# Patient Record
Sex: Female | Born: 1965 | Race: White | Hispanic: No | Marital: Married | Smoking: Former smoker
Health system: Southern US, Community
[De-identification: ages and names within clinical notes are randomized; demographics above are authoritative.]

## PROBLEM LIST (undated history)

## (undated) DIAGNOSIS — R600 Localized edema: Secondary | ICD-10-CM

## (undated) DIAGNOSIS — N289 Disorder of kidney and ureter, unspecified: Secondary | ICD-10-CM

## (undated) DIAGNOSIS — K219 Gastro-esophageal reflux disease without esophagitis: Secondary | ICD-10-CM

## (undated) DIAGNOSIS — F419 Anxiety disorder, unspecified: Secondary | ICD-10-CM

## (undated) DIAGNOSIS — C4491 Basal cell carcinoma of skin, unspecified: Secondary | ICD-10-CM

## (undated) DIAGNOSIS — E538 Deficiency of other specified B group vitamins: Secondary | ICD-10-CM

## (undated) DIAGNOSIS — G249 Dystonia, unspecified: Secondary | ICD-10-CM

## (undated) DIAGNOSIS — M549 Dorsalgia, unspecified: Secondary | ICD-10-CM

## (undated) DIAGNOSIS — G43909 Migraine, unspecified, not intractable, without status migrainosus: Secondary | ICD-10-CM

## (undated) DIAGNOSIS — R87629 Unspecified abnormal cytological findings in specimens from vagina: Secondary | ICD-10-CM

## (undated) DIAGNOSIS — I10 Essential (primary) hypertension: Secondary | ICD-10-CM

## (undated) DIAGNOSIS — K5900 Constipation, unspecified: Secondary | ICD-10-CM

## (undated) DIAGNOSIS — B029 Zoster without complications: Secondary | ICD-10-CM

## (undated) DIAGNOSIS — F909 Attention-deficit hyperactivity disorder, unspecified type: Secondary | ICD-10-CM

## (undated) DIAGNOSIS — G259 Extrapyramidal and movement disorder, unspecified: Secondary | ICD-10-CM

## (undated) DIAGNOSIS — G20C Parkinsonism, unspecified: Secondary | ICD-10-CM

## (undated) HISTORY — DX: Unspecified abnormal cytological findings in specimens from vagina: R87.629

## (undated) HISTORY — DX: Basal cell carcinoma of skin, unspecified: C44.91

## (undated) HISTORY — DX: Localized edema: R60.0

## (undated) HISTORY — DX: Attention-deficit hyperactivity disorder, unspecified type: F90.9

## (undated) HISTORY — DX: Migraine, unspecified, not intractable, without status migrainosus: G43.909

## (undated) HISTORY — DX: Essential (primary) hypertension: I10

## (undated) HISTORY — DX: Deficiency of other specified B group vitamins: E53.8

## (undated) HISTORY — PX: ANKLE ARTHROSCOPY: SUR85

## (undated) HISTORY — DX: Disorder of kidney and ureter, unspecified: N28.9

## (undated) HISTORY — DX: Dystonia, unspecified: G24.9

## (undated) HISTORY — DX: Gastro-esophageal reflux disease without esophagitis: K21.9

## (undated) HISTORY — DX: Zoster without complications: B02.9

## (undated) HISTORY — DX: Constipation, unspecified: K59.00

## (undated) HISTORY — DX: Dorsalgia, unspecified: M54.9

## (undated) HISTORY — DX: Parkinsonism, unspecified: G20.C

## (undated) HISTORY — DX: Anxiety disorder, unspecified: F41.9

## (undated) HISTORY — PX: CRYOTHERAPY: SHX1416

## (undated) HISTORY — DX: Extrapyramidal and movement disorder, unspecified: G25.9

---

## 2018-04-23 ENCOUNTER — Ambulatory Visit: Payer: BC Managed Care – PPO | Attending: Neurology | Admitting: Physical Therapy

## 2018-04-23 DIAGNOSIS — R293 Abnormal posture: Secondary | ICD-10-CM

## 2018-04-23 DIAGNOSIS — R2681 Unsteadiness on feet: Secondary | ICD-10-CM | POA: Diagnosis present

## 2018-04-23 DIAGNOSIS — R2689 Other abnormalities of gait and mobility: Secondary | ICD-10-CM | POA: Diagnosis present

## 2018-04-24 NOTE — Therapy (Signed)
[facility] [address] [place], [place], [address] Phone: [phone]   Fax:  [phone]  Physical Therapy Evaluation  Patient Details  Name: Gina Washington MRN: [id number] Date of Birth: 1966/09/10 Referring Provider: Sonia Baller, MD   Encounter Date: 04/23/2018  PT End of Session - 04/24/18 1553    Visit Number  1    Number of Visits  9    Date for PT Re-Evaluation  06/23/18    Authorization Type  BCBS    PT Start Time  1403    PT Stop Time  1448    PT Time Calculation (min)  45 min    Activity Tolerance  Patient tolerated treatment well    Behavior During Therapy  [facility] for tasks assessed/performed       No past medical history on file.    There were no vitals filed for this visit.   Subjective Assessment - 04/24/18 1547    Subjective  Been to 3 neurologists, ruling out PD, MS, stroke, with dx of functional movement disorder.  R foot and R UE affected, since 2012-2013, with sometimes RLE dragging and involuntary twitches.  Sometimes R foot catches the ground, but no reported falls.    Patient is accompained by:  Family member husband    Pertinent History  See med list from Dr. Hall Busing neurologist visit    Patient Stated Goals  Pt's goal for therapy is to improve symptoms.    Currently in Pain?  No/denies         [facility] PT Assessment - 04/23/18 1414      Assessment   Medical Diagnosis  Parkinsonian features    Referring Provider  Sonia Baller, MD    Onset Date/Surgical Date  04/13/18 MD visit    Hand Dominance  Right handwriting is worse      Precautions   Precautions  Fall      Balance Screen   Has the patient fallen in the past 6 months  No    Has the patient had a decrease in activity level because of a fear of falling?   Yes    Is the patient reluctant to leave their home because of a fear of falling?   No      Home Social worker  Private residence    Living  Arrangements  Spouse/significant other    Available Help at Discharge  Family    Type of [place] to enter    Entrance Stairs-Number of Steps  8    [place]  Two level;Able to live on main level with bedroom/bathroom    Home Equipment  None      Prior Function   Level of Independence  Independent with basic ADLs prefers to use LUE for ADLs    Vocation  Full time employment    Vocation Requirements  Special Ed teacher    Leisure  Enjoyed running, but has not done in a while, did aerobics, but hasn't done since move to [place]      Observation/Other Assessments   Focus on Therapeutic Outcomes (FOTO)   NA      ROM / Strength   AROM / PROM / Strength  Strength      Strength   Overall Strength Comments  Grossly tested 4/5 RLE, 5/5 LLE      Transfers   Transfers  Sit to Stand;Stand to Sit    Sit to Stand  6: Modified independent (Device/Increase time);Without upper extremity assist;From chair/3-in-1    Five time sit to stand comments   12.5    Stand to Sit  6: Modified independent (Device/Increase time);Without upper extremity assist;To chair/3-in-1    Comments  Does not report any difficulty getting up and down from chairs or low surfaces at home      Ambulation/Gait   Ambulation/Gait  Yes    Ambulation/Gait Assistance  6: Modified independent (Device/Increase time)    Ambulation Distance (Feet)  250 Feet    Assistive device  None    Gait Pattern  Step-through pattern;Decreased arm swing - right;Decreased step length - right;Decreased dorsiflexion - right;Poor foot clearance - right    Ambulation Surface  Level;Indoor    Gait velocity  11.02 (2.98 ft/sec) 12.6 cued heelstrike (2.6 ft/sec)      Standardized Balance Assessment   Standardized Balance Assessment  Timed Up and Go Test      Timed Up and Go Test   Normal TUG (seconds)  12.35    Manual TUG (seconds)  14.75    Cognitive TUG (seconds)  13.87    TUG  Comments  Scores >13.5-15 seconds indicates increased fall risk.  Scores >10% difference in TUG and TUG cog/man indicate difficulty with dual tasking with gait      Functional Gait  Assessment   Gait assessed   Yes    Gait Level Surface  Walks 20 ft, slow speed, abnormal gait pattern, evidence for imbalance or deviates 10-15 in outside of the 12 in walkway width. Requires more than 7 sec to ambulate 20 ft.    Change in Gait Speed  Able to change speed, demonstrates mild gait deviations, deviates 6-10 in outside of the 12 in walkway width, or no gait deviations, unable to achieve a major change in velocity, or uses a change in velocity, or uses an assistive device.    Gait with Horizontal Head Turns  Performs head turns smoothly with slight change in gait velocity (eg, minor disruption to smooth gait path), deviates 6-10 in outside 12 in walkway width, or uses an assistive device.    Gait with Vertical Head Turns  Performs task with slight change in gait velocity (eg, minor disruption to smooth gait path), deviates 6 - 10 in outside 12 in walkway width or uses assistive device    Gait and Pivot Turn  Pivot turns safely within 3 sec and stops quickly with no loss of balance.    Step Over Obstacle  Is able to step over one shoe box (4.5 in total height) without changing gait speed. No evidence of imbalance.    Gait with Narrow Base of Support  Is able to ambulate for 10 steps heel to toe with no staggering.    Gait with Eyes Closed  Walks 20 ft, slow speed, abnormal gait pattern, evidence for imbalance, deviates 10-15 in outside 12 in walkway width. Requires more than 9 sec to ambulate 20 ft. 11.37    Ambulating Backwards  Walks 20 ft, uses assistive device, slower speed, mild gait deviations, deviates 6-10 in outside 12 in walkway width. 14.96    Steps  Alternating feet, no rail.    Total Score  21    FGA comment:  Scores <22/30 indicate increased fall risk.                Objective  measurements completed on examination: See above  findings.                   PT Long Term Goals - 04/24/18 1600      PT LONG TERM GOAL #1   Title  Pt will be independent with HEP for improved balance and gait.  TARGET 05/22/18    Time  4    Period  Weeks    Status  New    Target Date  05/22/18      PT LONG TERM GOAL #2   Title  Pt will improve TUG and TUG cog/man scores to <10% difference for improved dual tasking with gait.    Time  4    Period  Weeks    Status  New    Target Date  05/22/18      PT LONG TERM GOAL #3   Title  Pt will improve Functional Gait Assessment score to at least 23/30 for decreased fall risk.    Time  4    Period  Weeks    Status  New    Target Date  05/22/18      PT LONG TERM GOAL #4   Title  Pt will verbalize understanding of fall prevention in home environment.    Time  4    Period  Weeks    Status  New    Target Date  05/22/18      PT LONG TERM GOAL #5   Title  Pt will verbalize plans for continued community fitness upon d/c from PT.    Time  4    Period  Weeks    Status  New    Target Date  05/22/18             Plan - 04/24/18 1554    Clinical Impression Statement  Pt is a 52 year old female who was referred by Dr. Hall Busing, with Parkinsonian features, but no clear PD diagnosis, with recent diagnosis of functional movement disorder.  She reports several year history of R foot drag with gait, decreased coordination/use of RUE with handwriting and ADLs.  Pt presents with decreased timing and coordination of gait, decreased balance.  Pt works as a Agricultural engineer and has enjoyed yoga in the past.  She would benefit from skilled PT to address the above stated deficits for decreased fall risk and improved safe and independent functional mobility.    History and Personal Factors relevant to plan of care:  PMH includes HTN    Clinical Presentation  Stable    Clinical Presentation due to:  fall risk per TUG /TUG cog/TUG man scores,  FGA scores    Clinical Decision Making  Low    Rehab Potential  Good    PT Frequency  2x / week    PT Duration  4 weeks plus eval    PT Treatment/Interventions  ADLs/Self Care Home Management;Gait training;Functional mobility training;Therapeutic activities;Therapeutic exercise;Balance training;Patient/family education;Neuromuscular re-education    PT Next Visit Plan  Initiate HEP for PWR! Moves in sitting, standing, gait training to focus on heelstrike and arm swing    Recommended Other Services  Possible referral to OT    Consulted and Agree with Plan of Care  Family member/caregiver;Patient    Family Member Consulted  husband       Patient will benefit from skilled therapeutic intervention in order to improve the following deficits and impairments:  Abnormal gait, Decreased balance, Decreased mobility, Difficulty walking  Visit Diagnosis: Other abnormalities of gait  and mobility  Unsteadiness on feet  Abnormal posture     Problem List There are no active problems to display for this patient.   Frazier Butt. 04/24/2018, 4:05 PM  Frazier Butt., PT   [facility] [address] [place], [place], [address] Phone: [phone]   Fax:  [phone]  Name: Gina Washington MRN: [id number] Date of Birth: 11-14-1965

## 2018-04-30 ENCOUNTER — Ambulatory Visit: Payer: BC Managed Care – PPO | Admitting: Physical Therapy

## 2018-04-30 ENCOUNTER — Encounter: Payer: Self-pay | Admitting: Physical Therapy

## 2018-04-30 ENCOUNTER — Ambulatory Visit: Payer: Self-pay | Admitting: Rehabilitation

## 2018-04-30 DIAGNOSIS — R293 Abnormal posture: Secondary | ICD-10-CM

## 2018-04-30 DIAGNOSIS — R2689 Other abnormalities of gait and mobility: Secondary | ICD-10-CM

## 2018-04-30 NOTE — Therapy (Signed)
[facility] [address] [place], [place], [address] Phone: [phone]   Fax:  [phone]  Physical Therapy Treatment  Patient Details  Name: Gina Washington MRN: [id number] Date of Birth: 07-28-1966 Referring Provider: Sonia Baller, MD   Encounter Date: 04/30/2018  PT End of Session - 04/30/18 2054    Visit Number  2    Number of Visits  9    Date for PT Re-Evaluation  06/23/18    Authorization Type  BCBS    PT Start Time  1316    PT Stop Time  1400    PT Time Calculation (min)  44 min    Activity Tolerance  Patient tolerated treatment well    Behavior During Therapy  [facility] for tasks assessed/performed       No past medical history on file.    There were no vitals filed for this visit.  Subjective Assessment - 04/30/18 1321    Subjective  No changes, just ready to make progress.    Patient is accompained by:  Family member husband    Pertinent History  See med list from Dr. Hall Busing neurologist visit    Patient Stated Goals  Pt's goal for therapy is to improve symptoms.    Currently in Pain?  No/denies                       [facility] Adult PT Treatment/Exercise - 04/30/18 0001      Ambulation/Gait   Ambulation/Gait  Yes    Ambulation/Gait Assistance  5: Supervision    Ambulation/Gait Assistance Details  Gait training with cues for large amplitude movement for increased RLE step length and relaxed arm swing.    Ambulation Distance (Feet)  80 Feet x 4, then 150 ft, then 80 ft x 2    Assistive device  None bilateral walking poles to facilitate arm swing    Gait Pattern  Step-through pattern;Decreased arm swing - right;Decreased step length - right;Decreased dorsiflexion - right;Poor foot clearance - right    Ambulation Surface  Level;Indoor    Gait Comments  Gait training with walking poles x 150 ft, then walking poles removed with cues to continue relaxed arm swing and increased RLE step length.         PWR [facility]) - 04/30/18 1328    PWR! exercises  Moves in sitting;Moves in standing    PWR! Up  x 10 reps    PWR! Rock  x 10 reps each side    PWR! Twist  x 10 reps each side    PWR Step  x 10 reps each side; then forward step and reach x 10, back step and reach x 10 reps    Comments  The above performed after several initial reps of each for proper technique, cues and rationale provided for large movement patterns for posture, weightshifting, trunk flexibility, and initiation of stepping.    PWR! Up  x 10    PWR! Rock  x 10 reps each side    PWR! Twist  x 10 reps each side    PWR! Step  x 5 reps each side    Comments  The above performed after initial 2-3 reps of each exercise to gain proper technique.  PT provides visual and verbal cues; provides rationale for these large amplitude movement patterns.          PT Education - 04/30/18 2054    Education Details  HEP for  large amplitude (PWR!) Moves in sitting and standing; discussed rationale for large amplitude movement patterns; discussed OT given RUE difficulty with coordination, handwriting, ADLs    Person(s) Educated  Patient    Methods  Explanation;Demonstration;Handout    Comprehension  Verbalized understanding;Returned demonstration;Verbal cues required;Need further instruction          PT Long Term Goals - 04/24/18 1600      PT LONG TERM GOAL #1   Title  Pt will be independent with HEP for improved balance and gait.  TARGET 05/22/18    Time  4    Period  Weeks    Status  New    Target Date  05/22/18      PT LONG TERM GOAL #2   Title  Pt will improve TUG and TUG cog/man scores to <10% difference for improved dual tasking with gait.    Time  4    Period  Weeks    Status  New    Target Date  05/22/18      PT LONG TERM GOAL #3   Title  Pt will improve Functional Gait Assessment score to at least 23/30 for decreased fall risk.    Time  4    Period  Weeks    Status  New    Target Date  05/22/18      PT LONG  TERM GOAL #4   Title  Pt will verbalize understanding of fall prevention in home environment.    Time  4    Period  Weeks    Status  New    Target Date  05/22/18      PT LONG TERM GOAL #5   Title  Pt will verbalize plans for continued community fitness upon d/c from PT.    Time  4    Period  Weeks    Status  New    Target Date  05/22/18            Plan - 04/30/18 2056    Clinical Impression Statement  Skilled PT session focused today on sitting and standing activities utilizing large amplitude movement pattern exercises (PWR! Moves) for improved RUE and RLE movement.  Also worked on large amplitude movement patterns with gait for improved RLE step length and foot clearance.  Pt responds well to these exercises, but does need occasional cueing to maintain large amplitude patterns.  Discussed benefits of occupational therapy, given pt's decreased functional use of RUE with ADLs, coordination and handwriting.      Rehab Potential  Good    PT Frequency  2x / week    PT Duration  4 weeks plus eval    PT Treatment/Interventions  ADLs/Self Care Home Management;Gait training;Functional mobility training;Therapeutic activities;Therapeutic exercise;Balance training;Patient/family education;Neuromuscular re-education    PT Next Visit Plan  Review HEP for PWR! Moves in sitting, standing, continue gait training to focus on heelstrike and arm swing    Recommended Other Services  Pt agrees to pursue OT referral    Consulted and Agree with Plan of Care  Patient    Family Member Consulted          Patient will benefit from skilled therapeutic intervention in order to improve the following deficits and impairments:  Abnormal gait, Decreased balance, Decreased mobility, Difficulty walking  Visit Diagnosis: Abnormal posture  Other abnormalities of gait and mobility     Problem List There are no active problems to display for this patient.   Gina Washington W. 04/30/2018, 9:00 PM  [facility] [address] [place], [place], [address] Phone: [phone]   Fax:  [phone]  Name: Gina Washington MRN: [id number] Date of Birth: [birth year]

## 2018-05-01 ENCOUNTER — Encounter

## 2018-05-06 ENCOUNTER — Ambulatory Visit: Payer: BC Managed Care – PPO | Admitting: Physical Therapy

## 2018-05-06 ENCOUNTER — Telehealth: Payer: Self-pay | Admitting: Physical Therapy

## 2018-05-06 ENCOUNTER — Encounter: Payer: Self-pay | Admitting: Physical Therapy

## 2018-05-06 DIAGNOSIS — R2681 Unsteadiness on feet: Secondary | ICD-10-CM

## 2018-05-06 DIAGNOSIS — R2689 Other abnormalities of gait and mobility: Secondary | ICD-10-CM

## 2018-05-06 NOTE — Patient Instructions (Signed)
Provided patient with PWR! Moves Handout:  -Forward step and weightshift x 10 reps each leg beside counter  -Back step and weightshfit x 10 reps each leg, facing counter

## 2018-05-06 NOTE — Telephone Encounter (Signed)
Paper faxed request for OT order to evaluate and treat for Gina Washington, due to difficulty with ADLs and decreased RUE use.    Mady Haagensen, PT 05/06/18 11:40 AM Phone: [phone] Fax: [phone]

## 2018-05-06 NOTE — Therapy (Signed)
[facility] [address] [place], [place], [address] Phone: [phone]   Fax:  [phone]  Physical Therapy Treatment  Patient Details  Name: Gina Washington MRN: [id number] Date of Birth: November 19, 1965 Referring Provider: Sonia Baller, MD   Encounter Date: 05/06/2018  PT End of Session - 05/06/18 1128    Visit Number  3    Number of Visits  9    Date for PT Re-Evaluation  06/23/18    Authorization Type  BCBS    PT Start Time  0849    PT Stop Time  0929    PT Time Calculation (min)  40 min    Equipment Utilized During Treatment  Gait belt    Activity Tolerance  Patient tolerated treatment well    Behavior During Therapy  [facility] for tasks assessed/performed       History reviewed. No pertinent past medical history.  History reviewed. No pertinent surgical history.  There were no vitals filed for this visit.  Subjective Assessment - 05/06/18 0851    Subjective  No changes, no falls, been doing the exercises some.    Patient is accompained by:  Family member husband    Pertinent History  See med list from Dr. Hall Busing neurologist visit    Patient Stated Goals  Pt's goal for therapy is to improve symptoms.    Currently in Pain?  No/denies                       [facility] Adult PT Treatment/Exercise - 05/06/18 0001      Ambulation/Gait   Ambulation/Gait  Yes    Ambulation/Gait Assistance  5: Supervision    Ambulation/Gait Assistance Details  Cues for increased stance time on RLE for more even stance time R vs. L.  Cues for heelstrike, increased step length RLE for equal, even step length both sides.    Ambulation Distance (Feet)  300 Feet    Assistive device  None    Gait Pattern  Step-through pattern;Decreased arm swing - right;Decreased step length - right;Decreased dorsiflexion - right;Poor foot clearance - right;Decreased stance time - right    Ambulation Surface  Level;Indoor    Gait Comments   Treadmill gait training 5 minutes, bilateral UE supported, at 0.8 mph (with cues for large steps to "kick" blue therapy ball in front of treadmill) up to 1.6 mph with frequent cues for foot clearance, heelstrike, increased RLE stance time.  Followed treadmill gait with on-ground gait as noted above.      Neuro Re-ed    Neuro Re-ed Details   Forward step and weightshift x 10 reps each leg, then back step and weightshift 10 reps each leg at counter.  Cues for increased effort, amplitude of RLE with heelstrike and foot clearance.        [facility]) - 05/06/18 0272    PWR! exercises  Moves in sitting;Moves in standing    PWR! Up  x 20 reps    PWR! Rock  x 10 reps each side    PWR! Twist  x 10 reps each side    PWR Step  x 10 reps each side    Comments  REviewed HEP with pt return demo understanding    PWR! Up  x 20     PWR! Rock   10 reps each side    PWR! Twist  x 10 reps each side    PWR! Step  x 10 reps each  side    Comments  REview of HEP given last visit-pt return demo understanding          PT Education - 05/06/18 1125    Education Details  Reviewed HEP with pt return demo understanding; gait mechanics-improved RLE step length, stance time, arm swing; added forward and backward step and weightshift to HEP    Person(s) Educated  Patient    Methods  Explanation;Demonstration;Verbal cues;Handout    Comprehension  Verbalized understanding;Returned demonstration;Verbal cues required;Need further instruction          PT Long Term Goals - 04/24/18 1600      PT LONG TERM GOAL #1   Title  Pt will be independent with HEP for improved balance and gait.  TARGET 05/22/18    Time  4    Period  Weeks    Status  New    Target Date  05/22/18      PT LONG TERM GOAL #2   Title  Pt will improve TUG and TUG cog/man scores to <10% difference for improved dual tasking with gait.    Time  4    Period  Weeks    Status  New    Target Date  05/22/18      PT LONG TERM GOAL #3   Title  Pt  will improve Functional Gait Assessment score to at least 23/30 for decreased fall risk.    Time  4    Period  Weeks    Status  New    Target Date  05/22/18      PT LONG TERM GOAL #4   Title  Pt will verbalize understanding of fall prevention in home environment.    Time  4    Period  Weeks    Status  New    Target Date  05/22/18      PT LONG TERM GOAL #5   Title  Pt will verbalize plans for continued community fitness upon d/c from PT.    Time  4    Period  Weeks    Status  New    Target Date  05/22/18            Plan - 05/06/18 1129    Clinical Impression Statement  Pt reports noticing more awareness of heelstrike with gait with performance of exercises.  Skilled PT session today focused on review of HEP and additions of forward and backward step and weightshift exercises to help with further practice/reinforcement of heelstrike; also worked on gait training on treadmill and on ground with pt noted to have decreased RLE stance time (able to improve with cues and practice).    Rehab Potential  Good    PT Frequency  2x / week    PT Duration  4 weeks plus eval    PT Treatment/Interventions  ADLs/Self Care Home Management;Gait training;Functional mobility training;Therapeutic activities;Therapeutic exercise;Balance training;Patient/family education;Neuromuscular re-education    PT Next Visit Plan  Review HEP for PWR! Moves in, standing, continue gait training to focus on heelstrike and arm swing, work on compliant surfaces, SLS RLE PT is fsxing OT request to Dr. Hall Busing 05/06/18    Consulted and Agree with Plan of Care  Patient    Family Member Consulted          Patient will benefit from skilled therapeutic intervention in order to improve the following deficits and impairments:  Abnormal gait, Decreased balance, Decreased mobility, Difficulty walking  Visit Diagnosis: Unsteadiness on feet  Other abnormalities of  gait and mobility     Problem List There are no active  problems to display for this patient.   Hiep Ollis W. 05/06/2018, 11:41 AM  Frazier Butt., PT   [facility] [address] [place], [place], [address] Phone: [phone]   Fax:  [phone]  Name: Gina Washington MRN: [id number] Date of Birth: 1966-08-23

## 2018-05-08 ENCOUNTER — Ambulatory Visit: Payer: BC Managed Care – PPO | Admitting: Physical Therapy

## 2018-05-08 ENCOUNTER — Encounter: Payer: Self-pay | Admitting: Physical Therapy

## 2018-05-08 DIAGNOSIS — R2689 Other abnormalities of gait and mobility: Secondary | ICD-10-CM | POA: Diagnosis not present

## 2018-05-08 DIAGNOSIS — R2681 Unsteadiness on feet: Secondary | ICD-10-CM

## 2018-05-08 NOTE — Therapy (Signed)
[facility] [address] [place], [place], [address] Phone: [phone]   Fax:  [phone]  Physical Therapy Treatment  Patient Details  Name: Gina Washington MRN: [id number] Date of Birth: Oct 10, 1966 Referring Provider: Sonia Baller, MD   Encounter Date: 05/08/2018  PT End of Session - 05/08/18 1130    Visit Number  4    Number of Visits  9    Date for PT Re-Evaluation  06/23/18    Authorization Type  BCBS    PT Start Time  [phone] arrived late, then went to restroom upon arrival    PT Stop Time  0930    PT Time Calculation (min)  34 min    Activity Tolerance  Patient tolerated treatment well    Behavior During Therapy  [facility] for tasks assessed/performed       History reviewed. No pertinent past medical history.  History reviewed. No pertinent surgical history.  There were no vitals filed for this visit.  Subjective Assessment - 05/08/18 0856    Subjective  Sorry I'm late-navigated to the wrong place (counselor office instead of PT)    Patient is accompained by:  Family member husband    Pertinent History  See med list from Dr. Hall Busing neurologist visit    Patient Stated Goals  Pt's goal for therapy is to improve symptoms.    Currently in Pain?  No/denies                       [facility] Adult PT Treatment/Exercise - 05/08/18 0001      Ambulation/Gait   Ambulation/Gait  Yes    Ambulation/Gait Assistance  5: Supervision    Ambulation/Gait Assistance Details  Cues for increased heelstrike on RLE, increased foot clearance/step length and RLE stance time.  First bout of gait:  2 episodes of R toe catching, 2nd bout of gait, no episodes of R foot catching.    Ambulation Distance (Feet)  230 Feet 400    Assistive device  None    Gait Pattern  Step-through pattern;Decreased arm swing - right;Decreased step length - right;Decreased dorsiflexion - right;Poor foot clearance - right;Decreased stance time -  right    Ambulation Surface  Level;Indoor      Neuro Re-ed    Neuro Re-ed Details   Forward step and weightshift x 10 reps each leg, then back step and weightshift 10 reps each leg at counter.  Cues for increased effort, amplitude of RLE with heelstrike and foot clearance.  (Review of HEP given last visit-pt return demo understanding).  Standing beside counter, combined forward>backward step and weightshift 10-15 reps each side with focus on foot clearance and smooth movement "rolling" R foot through heelstrike>push-off.  Stagger stance forward/back rocking x 10-15 reps each side with added arm swing      Exercises   Exercises  Ankle      Ankle Exercises: Standing   Other Standing Ankle Exercises  for RLE weigthbearing:  wall squats x 15 reps, sit<>stand with L foot on baalnce disk 2 sets x 10 reps      Ankle Exercises: Seated   Toe Raise  10 reps 2 sets    Other Seated Ankle Exercises  Ankle eversion 2 sets x 10 reps                  PT Long Term Goals - 04/24/18 1600      PT LONG TERM GOAL #1   Title  Pt will be independent with HEP for improved balance and gait.  TARGET 05/22/18    Time  4    Period  Weeks    Status  New    Target Date  05/22/18      PT LONG TERM GOAL #2   Title  Pt will improve TUG and TUG cog/man scores to <10% difference for improved dual tasking with gait.    Time  4    Period  Weeks    Status  New    Target Date  05/22/18      PT LONG TERM GOAL #3   Title  Pt will improve Functional Gait Assessment score to at least 23/30 for decreased fall risk.    Time  4    Period  Weeks    Status  New    Target Date  05/22/18      PT LONG TERM GOAL #4   Title  Pt will verbalize understanding of fall prevention in home environment.    Time  4    Period  Weeks    Status  New    Target Date  05/22/18      PT LONG TERM GOAL #5   Title  Pt will verbalize plans for continued community fitness upon d/c from PT.    Time  4    Period  Weeks    Status   New    Target Date  05/22/18            Plan - 05/08/18 1131    Clinical Impression Statement  With focus and attention to R foot clearance, heelstrike, smooth "roll" through push off into swing phase with exercises at counter, pt has good RLE and R foot movements; however, with gait initially and with distraction, she has decreased R foot clearance.  With weightbearing and seated ankle exercises, pt noted to have increased RLE tremor.  Pt will continue to benefit from skilled PT to address gait and balance.    Rehab Potential  Good    PT Frequency  2x / week    PT Duration  4 weeks plus eval    PT Treatment/Interventions  ADLs/Self Care Home Management;Gait training;Functional mobility training;Therapeutic activities;Therapeutic exercise;Balance training;Patient/family education;Neuromuscular re-education    PT Next Visit Plan  Continue gait training to focus on heelstrike, arm swing; try compliant surfaces, SLS activities for RLE    Recommended Other Services  PT faxed OT referral request to Dr. Hall Busing 05/06/18    Consulted and Agree with Plan of Care  Patient    Family Member Consulted          Patient will benefit from skilled therapeutic intervention in order to improve the following deficits and impairments:  Abnormal gait, Decreased balance, Decreased mobility, Difficulty walking  Visit Diagnosis: Other abnormalities of gait and mobility  Unsteadiness on feet     Problem List There are no active problems to display for this patient.   MARRIOTT,AMY W. 05/08/2018, 11:34 AM  Frazier Butt., PT   [facility] [address] [place], [place], [address] Phone: [phone]   Fax:  [phone]  Name: Gina Washington MRN: [id number] Date of Birth: November 07, 1965

## 2018-05-11 ENCOUNTER — Encounter: Payer: Self-pay | Admitting: Physical Therapy

## 2018-05-11 ENCOUNTER — Ambulatory Visit: Payer: BC Managed Care – PPO | Admitting: Physical Therapy

## 2018-05-11 DIAGNOSIS — R2689 Other abnormalities of gait and mobility: Secondary | ICD-10-CM | POA: Diagnosis not present

## 2018-05-11 DIAGNOSIS — R293 Abnormal posture: Secondary | ICD-10-CM

## 2018-05-11 NOTE — Therapy (Signed)
[facility] [address] [place], [place], [address] Phone: [phone]   Fax:  [phone]  Physical Therapy Treatment  Patient Details  Name: Gina Washington MRN: [id number] Date of Birth: August 25, 1966 Referring Provider: Sonia Baller, MD   Encounter Date: 05/11/2018  PT End of Session - 05/11/18 1145    Visit Number  5    Number of Visits  9    Date for PT Re-Evaluation  06/23/18    Authorization Type  BCBS    PT Start Time  1105    PT Stop Time  1145    PT Time Calculation (min)  40 min    Activity Tolerance  Patient tolerated treatment well    Behavior During Therapy  [facility] for tasks assessed/performed       History reviewed. No pertinent past medical history.  History reviewed. No pertinent surgical history.  There were no vitals filed for this visit.  Subjective Assessment - 05/11/18 1109    Subjective  Noticed yesterday and today catching herself walking "normally" without having to think about it.    Patient is accompained by:  Family member husband    Pertinent History  See med list from Dr. Hall Busing neurologist visit    Patient Stated Goals  Pt's goal for therapy is to improve symptoms.    Currently in Pain?  No/denies                       [facility] Adult PT Treatment/Exercise - 05/11/18 0001      Ambulation/Gait   Ambulation/Gait  Yes    Ambulation/Gait Assistance  5: Supervision    Ambulation/Gait Assistance Details  practising heelstrike, toe off and arm swing.    Ambulation Distance (Feet)  400 Feet indoors + 200 feet outdoors    Assistive device  None    Gait Pattern  Step-through pattern;Decreased arm swing - right;Decreased step length - right;Decreased dorsiflexion - right;Poor foot clearance - right;Decreased stance time - right    Ambulation Surface  Outdoor;Unlevel;Level;Indoor;Paved;Gravel;Grass          Balance Exercises - 05/11/18 1118      Balance Exercises:  Standing   Stepping Strategy  Anterior;Posterior;Lateral;Foam/compliant surface working on arm swing and push off    Retro Gait  Foam/compliant surface;4 reps    Sidestepping  Foam/compliant support;4 reps    Marching Limitations  high marching with 3 sec holds on gravel.             PT Long Term Goals - 04/24/18 1600      PT LONG TERM GOAL #1   Title  Pt will be independent with HEP for improved balance and gait.  TARGET 05/22/18    Time  4    Period  Weeks    Status  New    Target Date  05/22/18      PT LONG TERM GOAL #2   Title  Pt will improve TUG and TUG cog/man scores to <10% difference for improved dual tasking with gait.    Time  4    Period  Weeks    Status  New    Target Date  05/22/18      PT LONG TERM GOAL #3   Title  Pt will improve Functional Gait Assessment score to at least 23/30 for decreased fall risk.    Time  4    Period  Weeks    Status  New  Target Date  05/22/18      PT LONG TERM GOAL #4   Title  Pt will verbalize understanding of fall prevention in home environment.    Time  4    Period  Weeks    Status  New    Target Date  05/22/18      PT LONG TERM GOAL #5   Title  Pt will verbalize plans for continued community fitness upon d/c from PT.    Time  4    Period  Weeks    Status  New    Target Date  05/22/18            Plan - 05/11/18 1249    Clinical Impression Statement  Continued to work on gait mechanics (R foot clearance, heelstrike, push off and R swing ) with stepping/weigh shifting activites (compliant and non compliant surface), gait on grass and gravel having pt emphasize each technique; pt able to demonstrate with min cues during session .                                                 Rehab Potential  Good    PT Frequency  2x / week    PT Duration  4 weeks plus eval    PT Treatment/Interventions  ADLs/Self Care Home Management;Gait training;Functional mobility training;Therapeutic activities;Therapeutic exercise;Balance  training;Patient/family education;Neuromuscular re-education    PT Next Visit Plan  Continue gait training to focus on heelstrike, arm swing; try compliant surfaces, SLS activities for RLE    Consulted and Agree with Plan of Care  Patient    Family Member Consulted          Patient will benefit from skilled therapeutic intervention in order to improve the following deficits and impairments:  Abnormal gait, Decreased balance, Decreased mobility, Difficulty walking  Visit Diagnosis: Other abnormalities of gait and mobility  Abnormal posture     Problem List There are no active problems to display for this patient.   Bjorn Loser, PTA  05/11/18, 12:57 PM [facility] [address] [place], [place], [address] Phone: [phone]   Fax:  [phone]  Name: Antha Niday MRN: [id number] Date of Birth: [birth year]

## 2018-05-13 ENCOUNTER — Ambulatory Visit: Payer: BC Managed Care – PPO | Admitting: Physical Therapy

## 2018-05-19 ENCOUNTER — Encounter: Payer: Self-pay | Admitting: Physical Therapy

## 2018-05-19 ENCOUNTER — Ambulatory Visit: Payer: BC Managed Care – PPO | Attending: Neurology | Admitting: Physical Therapy

## 2018-05-19 DIAGNOSIS — R2689 Other abnormalities of gait and mobility: Secondary | ICD-10-CM | POA: Diagnosis present

## 2018-05-19 DIAGNOSIS — R2681 Unsteadiness on feet: Secondary | ICD-10-CM | POA: Diagnosis not present

## 2018-05-19 DIAGNOSIS — M6281 Muscle weakness (generalized): Secondary | ICD-10-CM | POA: Insufficient documentation

## 2018-05-19 DIAGNOSIS — R278 Other lack of coordination: Secondary | ICD-10-CM | POA: Insufficient documentation

## 2018-05-19 DIAGNOSIS — R293 Abnormal posture: Secondary | ICD-10-CM | POA: Diagnosis present

## 2018-05-19 NOTE — Patient Instructions (Signed)
ANKLE: Eversion, Bilateral    Sit at edge of surface, feet on floor. Raise toes of right foot up and and pinkie toe out. Do not move hips or knees. __100_ reps per day-- do them throughout the day.

## 2018-05-20 NOTE — Therapy (Signed)
[facility] [address] [place], [place], [address] Phone: [phone]   Fax:  [phone]  Physical Therapy Treatment  Patient Details  Name: Gina Washington MRN: [id number] Date of Birth: [birth year] Referring Provider: Sonia Baller, MD   Encounter Date: 05/19/2018  PT End of Session - 05/20/18 0744    Visit Number  6    Number of Visits  9    Date for PT Re-Evaluation  06/23/18    Authorization Type  BCBS    PT Start Time  1110 pt late arrival    PT Stop Time  1145    PT Time Calculation (min)  35 min    Activity Tolerance  Patient tolerated treatment well    Behavior During Therapy  [facility] for tasks assessed/performed       History reviewed. No pertinent past medical history.  History reviewed. No pertinent surgical history.  There were no vitals filed for this visit.  Subjective Assessment - 05/19/18 1112    Subjective  No falls no changes. Doing HEP Standing at counter ex's seem easy. [facility]    Patient is accompained by:  Family member husband    Pertinent History  See med list from Dr. Hall Busing neurologist visit    Patient Stated Goals  Pt's goal for therapy is to improve symptoms.    Currently in Pain?  No/denies                       [facility] Adult PT Treatment/Exercise - 05/19/18 1700      Ambulation/Gait   Ambulation/Gait Assistance  7: Independent    Ambulation/Gait Assistance Details  pt able to recall all strategies to improve her foot clearance, stride length, and arm swing (Of note, pt arrived wearing wedged 3" heels and reports no difficulty walking in heels; changed into sneakers for therapy)    Ambulation Distance (Feet)  200 Feet 40    Assistive device  None    Gait Pattern  Step-through pattern;Decreased arm swing - right;Poor foot clearance - right;Decreased dorsiflexion - right    Ambulation Surface   Level;Indoor;Unlevel compliant surfaces      Ankle Exercises: Seated   Other Seated Ankle Exercises  rt DF x 20; eversion x 10          Balance Exercises - 05/19/18 1700      Balance Exercises: Standing   SLS with Vectors  Foam/compliant surface cone double taps; slow march with cone taps red+blue [id number]    Retro Gait  Foam/compliant surface;4 reps red+blue mats    Sidestepping  Foam/compliant support;4 reps    Marching Limitations  high marching with 3 sec holds on red+blue mats    Other Standing Exercises  on large "river rock" ball of foot on flat surface, heel raises x 20 (from below horizontal) light UE support         PT Education - 05/20/18 0753    Education Details  addition to HEP; OK to stop fwd and backward stepping (demonstrated excellent technique, has shown carryover to gait, no instances of LOB backwards per pt report)    Person(s) Educated  Patient    Methods  Explanation;Demonstration;Verbal cues;Handout    Comprehension  Verbalized understanding;Returned demonstration;Verbal cues required          PT Long Term Goals - 04/24/18 1600      PT LONG TERM GOAL #  1   Title  Pt will be independent with HEP for improved balance and gait.  TARGET 05/22/18    Time  4    Period  Weeks    Status  New    Target Date  05/22/18      PT LONG TERM GOAL #2   Title  Pt will improve TUG and TUG cog/man scores to <10% difference for improved dual tasking with gait.    Time  4    Period  Weeks    Status  New    Target Date  05/22/18      PT LONG TERM GOAL #3   Title  Pt will improve Functional Gait Assessment score to at least 23/30 for decreased fall risk.    Time  4    Period  Weeks    Status  New    Target Date  05/22/18      PT LONG TERM GOAL #4   Title  Pt will verbalize understanding of fall prevention in home environment.    Time  4    Period  Weeks    Status  New    Target Date  05/22/18      PT LONG TERM GOAL #5   Title  Pt will verbalize plans for  continued community fitness upon d/c from PT.    Time  4    Period  Weeks    Status  New    Target Date  05/22/18            Plan - 05/20/18 0755    Clinical Impression Statement  Focus on updating HEP due to pt reporting fwd/backward stepping was very easy (and demonstrated excellent technique and carryover to gait). Strengthening, coordination, and balance with use of compliant surfaces. Discussed plan for d/c at next visit and pt reports she plans to join a water aerobics class for continued exercise.     Rehab Potential  Good    PT Frequency  2x / week    PT Duration  4 weeks plus eval    PT Treatment/Interventions  ADLs/Self Care Home Management;Gait training;Functional mobility training;Therapeutic activities;Therapeutic exercise;Balance training;Patient/family education;Neuromuscular re-education    PT Next Visit Plan  check LTGs and d/c    Consulted and Agree with Plan of Care  Patient    Family Member Consulted          Patient will benefit from skilled therapeutic intervention in order to improve the following deficits and impairments:  Abnormal gait, Decreased balance, Decreased mobility, Difficulty walking  Visit Diagnosis: Unsteadiness on feet  Other abnormalities of gait and mobility     Problem List There are no active problems to display for this patient.   Rexanne Mano, PT 05/20/2018, 7:58 AM  [facility] [address] [place], [place], [address] Phone: [phone]   Fax:  [phone]  Name: Gina Washington MRN: [id number] Date of Birth: 1966-04-03

## 2018-05-21 ENCOUNTER — Ambulatory Visit: Payer: BC Managed Care – PPO | Admitting: Physical Therapy

## 2018-05-21 ENCOUNTER — Encounter: Payer: Self-pay | Admitting: Physical Therapy

## 2018-05-21 ENCOUNTER — Ambulatory Visit: Payer: BC Managed Care – PPO | Admitting: Occupational Therapy

## 2018-05-21 DIAGNOSIS — R2681 Unsteadiness on feet: Secondary | ICD-10-CM

## 2018-05-21 DIAGNOSIS — R2689 Other abnormalities of gait and mobility: Secondary | ICD-10-CM

## 2018-05-21 DIAGNOSIS — M6281 Muscle weakness (generalized): Secondary | ICD-10-CM

## 2018-05-21 DIAGNOSIS — R278 Other lack of coordination: Secondary | ICD-10-CM

## 2018-05-21 NOTE — Therapy (Signed)
[facility] [address] [place], [place], [address] Phone: [phone]   Fax:  [phone]  Physical Therapy Treatment and Discharge Summary  Patient Details  Washington: Gina Washington MRN: [id number] Date of Birth: [birth year] Referring Provider: Sonia Baller MD   Encounter Date: 05/21/2018  PT End of Session - 05/21/18 2034    Visit Number  7    Number of Visits  9    Date for PT Re-Evaluation  06/23/18    Authorization Type  BCBS    PT Start Time  1321   late arrival   PT Stop Time  1357    PT Time Calculation (min)  36 min    Activity Tolerance  Patient tolerated treatment well    Behavior During Therapy  [facility] for tasks assessed/performed       History reviewed. No pertinent past medical history.  History reviewed. No pertinent surgical history.  There were no vitals filed for this visit.  Subjective Assessment - 05/21/18 1323    Subjective  Did not make it to water aerobics today. Was taking her dog's bandages off from recent accident    Patient is accompained by:  Family member   husband   Pertinent History  See med list from Dr. Hall Busing neurologist visit    Patient Stated Goals  Pt's goal for therapy is to improve symptoms.    Currently in Pain?  No/denies         [facility] PT Assessment - 05/21/18 1330      Ambulation/Gait   Gait velocity  32.8/9.25=3.55 ft/sec      Timed Up and Go Test   Normal TUG (seconds)  8.25    Manual TUG (seconds)  9.53    Cognitive TUG (seconds)  13.94      Functional Gait  Assessment   Gait assessed   Yes    Gait Level Surface  Walks 20 ft in less than 7 sec but greater than 5.5 sec, uses assistive device, slower speed, mild gait deviations, or deviates 6-10 in outside of the 12 in walkway width.   6.04   Change in Gait Speed  Able to smoothly change walking speed without loss of balance or gait deviation. Deviate no more than 6 in outside of the 12 in walkway width.     Gait with Horizontal Head Turns  Performs head turns smoothly with slight change in gait velocity (eg, minor disruption to smooth gait path), deviates 6-10 in outside 12 in walkway width, or uses an assistive device.    Gait with Vertical Head Turns  Performs task with slight change in gait velocity (eg, minor disruption to smooth gait path), deviates 6 - 10 in outside 12 in walkway width or uses assistive device    Gait and Pivot Turn  Pivot turns safely within 3 sec and stops quickly with no loss of balance.    Step Over Obstacle  Is able to step over 2 stacked shoe boxes taped together (9 in total height) without changing gait speed. No evidence of imbalance.    Gait with Narrow Base of Support  Is able to ambulate for 10 steps heel to toe with no staggering.    Gait with Eyes Closed  Walks 20 ft, slow speed, abnormal gait pattern, evidence for imbalance, deviates 10-15 in outside 12 in walkway width. Requires more than 9 sec to ambulate 20 ft.    Ambulating Backwards  Walks 20 ft, uses assistive device, slower  speed, mild gait deviations, deviates 6-10 in outside 12 in walkway width.    Steps  Alternating feet, no rail.    Total Score  24                   [facility] Adult PT Treatment/Exercise - 05/21/18 1330      Transfers   Sit to Stand  6: Modified independent (Device/Increase time)    Stand to Sit  6: Modified independent (Device/Increase time);Without upper extremity assist;To chair/3-in-1    Number of Reps  10 reps      Ambulation/Gait   Ambulation/Gait Assistance  7: Independent    Ambulation/Gait Assistance Details  pt able to recall all strategies to improve her foot clearance, stride length, and arm swing (Of note, pt arrived wearing wedged 3" heels and reports no difficulty walking in heels; changed into sneakers for therapy)    Ambulation Distance (Feet)  100 Feet   1000; 120   Assistive device  None    Gait Pattern  Step-through pattern;Decreased arm swing -  right;Poor foot clearance - right;Decreased dorsiflexion - right    Ambulation Surface  Level;Unlevel;Indoor;Outdoor;Gravel;Grass;Paved          Balance Exercises - 05/21/18 2026      Balance Exercises: Standing   Tandem Gait  Forward;Retro;Intermittent upper extremity support;Foam/compliant surface    Retro Gait  Foam/compliant surface;Upper extremity support    Sidestepping  Foam/compliant support    Step Over Hurdles / Cones  6-8" hurdles (intermixed) stepping over forward, sideways, backwards    Marching Limitations  high marching with 3 sec holds on red+blue mats        PT Education - 05/21/18 2033    Education Details  results of LTGs; to maintain (or continue to improve) she must continue to exercise; incr fall risk if not focused on her walking (ie trying to multi-task).    Person(s) Educated  Patient    Methods  Explanation    Comprehension  Verbalized understanding          PT Long Term Goals - 05/21/18 2035      PT LONG TERM GOAL #1   Title  Pt will be independent with HEP for improved balance and gait.  TARGET 05/22/18    Time  4    Period  Weeks    Status  Achieved      PT LONG TERM GOAL #2   Title  Pt will improve TUG and TUG cog/man scores to <10% difference for improved dual tasking with gait.    Baseline  TUG 8.25 sec; 10% > is 9.08 sec; manual 9.53 sec, cognitive 13.94 sec    Time  4    Period  Weeks    Status  Not Met      PT LONG TERM GOAL #3   Title  Pt will improve Functional Gait Assessment score to at least 23/30 for decreased fall risk.    Baseline  05/21/18 24/30    Time  4    Period  Weeks    Status  Achieved      PT LONG TERM GOAL #4   Title  Pt will verbalize understanding of fall prevention in home environment.    Time  4    Period  Weeks    Status  Achieved      PT LONG TERM GOAL #5   Title  Pt will verbalize plans for continued community fitness upon d/c from PT.    Time  [place]    Status  Achieved             Plan - 05/21/18 2039    Clinical Impression Statement  LTGs assessed with pt meeting 4 of 5 goals with one goal not met due to difficulty with mutti-tasking. Patient agrees with discharge from PT with plan to focus on OT for improved use of her right hand.     Rehab Potential  Good    PT Frequency  2x / week    PT Duration  4 weeks   plus eval   PT Treatment/Interventions  ADLs/Self Care Home Management;Gait training;Functional mobility training;Therapeutic activities;Therapeutic exercise;Balance training;Patient/family education;Neuromuscular re-education    Consulted and Agree with Plan of Care  Patient    Family Member Consulted          Patient will benefit from skilled therapeutic intervention in order to improve the following deficits and impairments:  Abnormal gait, Decreased balance, Decreased mobility, Difficulty walking  Visit Diagnosis: Unsteadiness on feet  Other abnormalities of gait and mobility     Problem List There are no active problems to display for this patient. PHYSICAL THERAPY DISCHARGE SUMMARY  Visits from Start of Care: 7  Current functional level related to goals / functional outcomes: See goals above   Remaining deficits: Rt sided stiffness and weakness   Education / Equipment: HEP  Plan: Patient agrees to discharge.  Patient goals were partially met. Patient is being discharged due to being pleased with the current functional level.  ?????       Rexanne Mano, PT 05/21/2018, 8:43 PM  [facility] [address] [place], [place], [address] Phone: [phone]   Fax:  [phone]  Washington: Gina Washington MRN: [id number] Date of Birth: 1966-03-06

## 2018-05-22 NOTE — Therapy (Signed)
[facility] [address] [place], [place], [address] Phone: [phone]   Fax:  [phone]  Occupational Therapy Evaluation  Patient Details  Name: Gina Washington MRN: [id number] Date of Birth: [birth year] Referring Provider: Sonia Baller MD   Encounter Date: 05/21/2018  OT End of Session - 05/22/18 1630    Number of Visits  13    Authorization Type  BCBS    OT Start Time  1408    OT Stop Time  1445    OT Time Calculation (min)  37 min    Behavior During Therapy  [facility] for tasks assessed/performed       No past medical history on file.  No past surgical history on file.  There were no vitals filed for this visit.  Subjective Assessment - 05/21/18 1411    Subjective   Denies pain    Patient Stated Goals  regain use of RUE    Currently in Pain?  No/denies        [facility] OT Assessment - 05/21/18 1413      Assessment   Medical Diagnosis  Parkinsonian features, functional movement d/o diagnosis    Referring Provider  Sonia Baller MD    Onset Date/Surgical Date  05/13/18    Hand Dominance  Right      Precautions   Precautions  Fall      Balance Screen   Has the patient fallen in the past 6 months  No    Has the patient had a decrease in activity level because of a fear of falling?   Yes    Is the patient reluctant to leave their home because of a fear of falling?   No      Home  Environment   Family/patient expects to be discharged to:  Private residence    Home Access  Stairs    Lives With  [place] with basic ADLs    Vocation  Full time employment    Vocation Requirements  Special Ed teacher    Leisure  Enjoyed running, but has not done in a while, did aerobics, but hasn't done since move to [place]      ADL   Eating/Feeding  Modified independent   using LUE   Grooming  Modified independent    Grooming Patient Percentage  --    using mainly LUE   Upper Body Bathing  Modified independent    Lower Body Bathing  Modified independent    Upper Body Dressing  Increased time;Independent    Lower Body Dressing  Increased time;Modified independent    Toilet Transfer  Modified independent    Tub/Shower Transfer  Modified independent    ADL comments  Pt reports learned non use of RUE      IADL   Shopping  Takes care of all shopping needs independently    [place] alone or with occasional assistance   requires increased time and effort   Meal Prep  Plans, prepares and serves adequate meals independently    Medication Management  Is responsible for taking medication in correct dosages at correct time    Financial Management  Manages financial matters independently (budgets, writes checks, pays rent, bills goes to bank), collects and keeps track of income      Mobility   Mobility Status  Independent      Written Expression  Dominant Hand  Right    Handwriting  100% legible;Increased time      Vision Assessment   Vision Assessment  Vision not tested      Cognition   Overall Cognitive Status  Within Functional Limits for tasks assessed    Memory  Impaired    Memory Impairment  Decreased short term memory    Cognition Comments  Pt reports difficulties remembering names, and frequently used words      Observation/Other Assessments   Other Surveys   Select    Physical Performance Test    Yes    Simulated Eating Time (seconds)  18.75 secs      Sensation   Light Touch  Impaired by gross assessment   bilateral hands, pt reports she thinks it's carpal tunnel     Coordination   Gross Motor Movements are Fluid and Coordinated  No    Fine Motor Movements are Fluid and Coordinated  No    9 Hole Peg Test  Right;Left    Right 9 Hole Peg Test  30.97 secs    Left 9 Hole Peg Test  22.72 secs    Box and Blocks  RUE 45 blocks LUE 68 blocks       ROM / Strength   AROM / PROM / Strength   AROM;Strength      AROM   Overall AROM   Within functional limits for tasks performed   movements are not smooth for RUE, tremor present     Strength   Overall Strength Comments  RUE grossly 4/5, LUE grossly 5/5      Hand Function   Right Hand Grip (lbs)  45 lbs    Left Hand Grip (lbs)  65 lbs                        OT Short Term Goals - 05/22/18 1642      OT SHORT TERM GOAL #1   Title  I with HEP due 06/12/18    Time  3    Period  Weeks    Status  New    Target Date  06/12/18      OT SHORT TERM GOAL #2   Title  Pt will demonstrate improved RUE functional use as evidenced by increasing  box/ blocks score by 5 blocks.    Baseline  RUE 45 blocks, LUE 68 blocks    Time  3    Period  Weeks    Status  New      OT SHORT TERM GOAL #3   Title  Pt will report she is completing basic ADLS with increased ease in a timely manner.    Time  3    Period  Weeks    Status  New        OT Long Term Goals - 05/22/18 1650      OT LONG TERM GOAL #1   Title  Pt will resume use of RUE as her dominant hand at least 75% of the time for ADLs/IADLs.- due 07/21/18    Baseline  does not use RUE consistently    Time  6    Period  Weeks    Status  New    Target Date  07/21/18      OT LONG TERM GOAL #2   Title  Pt will demonstrate improved RUE strength for ADLs as evidenced by increasing grip strength to 50 lbs or greater for RUE.  Baseline  RUE 45 lbs, LUe 65 lbs    Time  6    Period  Weeks    Status  New      OT LONG TERM GOAL #3   Title  Pt will demonstrate improved RUE strength and functional use as evidenced by retrieving a 3 lbs weight from overhead shelf with RUE x 5 reps without drops.    Time  6    Period  Weeks    Status  New            Plan - 05/22/18 1631    Clinical Impression Statement  Pt is a 52 year old female who was referred by Dr. Hall Busing, with Parkinsonian features, but no clear PD diagnosis, with recent diagnosis of functional movement  disorder. She reports several year history of R foot drag with gait, decreased coordination/use of RUE with handwriting and ADLs. 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 She would benefit from skilled OT to address the above stated deficits in order to maximize safety and I with ADLs/ IADLS.    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, PMH: HTN    Occupational performance deficits (Please refer to evaluation for details):  ADL's;IADL's;Work;Play;Leisure;Social Participation    Rehab Potential  Good    Current Impairments/barriers affecting progress:  no clear PD diagnosis, functional movement disorder diagnosis    OT Frequency  2x / week   or 12 visits total over 8 weeks   OT Duration  6 weeks    OT Treatment/Interventions  Self-care/ADL training;Therapeutic exercise;Patient/family education;Neuromuscular education;Moist Heat;Paraffin;Fluidtherapy;Energy conservation;Therapeutic activities;Balance training;Cognitive remediation/compensation;Manual Therapy;Contrast Bath;Ultrasound;Cryotherapy;DME and/or AE instruction    Plan  initate HEP for RUE coordination and functional use    Clinical Decision Making  Limited treatment options, no task modification necessary    Consulted and Agree with Plan of Care  Patient       Patient will benefit from skilled therapeutic intervention in order to improve the following deficits and impairments:  Abnormal gait, Decreased cognition, Impaired flexibility, Impaired UE functional use, Impaired perceived functional ability, Difficulty walking, Decreased safety awareness, Decreased knowledge of precautions, Decreased balance, Decreased activity tolerance, Decreased  endurance, Decreased range of motion, Decreased strength, Decreased coordination, Impaired sensation, Decreased knowledge of use of DME  Visit Diagnosis: Other lack of coordination - Plan: Ot plan of care cert/re-cert  Muscle weakness (generalized) - Plan: Ot plan of care cert/re-cert  Unsteadiness on feet - Plan: Ot plan of care cert/re-cert    Problem List There are no active problems to display for this patient.   Rian Koon 05/22/2018, 4:57 PM Theone Murdoch, OTR/L [phone] Phone: [phone] 4:57 PM 05/22/18 [facility] [address] [name], [place], [address] Phone: [phone]   Fax:  [phone]  Name: Gina Washington MRN: [id number] Date of Birth: 1966-07-07

## 2018-05-29 ENCOUNTER — Encounter

## 2018-06-03 ENCOUNTER — Ambulatory Visit: Payer: BC Managed Care – PPO | Admitting: Occupational Therapy

## 2018-06-03 DIAGNOSIS — R278 Other lack of coordination: Secondary | ICD-10-CM

## 2018-06-03 DIAGNOSIS — R2681 Unsteadiness on feet: Secondary | ICD-10-CM | POA: Diagnosis not present

## 2018-06-03 DIAGNOSIS — M6281 Muscle weakness (generalized): Secondary | ICD-10-CM

## 2018-06-03 NOTE — Patient Instructions (Signed)
Coordination Exercises  Perform the following exercises for 20 minutes 1-2 times per day. Perform with right hand(s). Perform using big movements.   Flipping Cards: Place deck of cards on the table. Flip cards over by opening your hand big to grasp and then turn your palm up big.  Deal cards: Hold 1/2 or whole deck in your hand. Use thumb to push card off top of deck with one big push.  Rotate ball with fingertips: Pick up with fingers/thumb and move as much as you can with each turn/movement (clockwise and counter-clockwise).  Toss ball from one hand to the other: Toss big/high.  Toss ball in the air and catch with the same hand: Toss big/high.  Pick up coins and place in coin bank or container: Pick up with big, intentional movements. Do not drag coin to the edge.  Pick up coins and stack one at a time: Pick up with big, intentional movements. Do not drag coin to the edge. (5-10 in a stack)  Pick up 5-10 coins one at a time and hold in palm. Then, move coins from palm to fingertips one at time and place in coin bank/container.  Practice writing: Slow down, write big, and focus on forming each letter.  Perform "Flicks"/hand stretches: Close hands then flick out your fingers with focus on opening hands, pulling wrists back, and extending elbows like you are pushing.

## 2018-06-03 NOTE — Therapy (Signed)
[facility] [address] [place], [place], [address] Phone: [phone]   Fax:  [phone]  Occupational Therapy Treatment  Patient Details  Name: Gina Washington MRN: [id number] Date of Birth: [birth year] Referring Provider: Sonia Baller MD   Encounter Date: 06/03/2018  OT End of Session - 06/03/18 1751    Visit Number  2    Number of Visits  13    Authorization Type  BCBS    OT Start Time  1450    OT Stop Time  1530    OT Time Calculation (min)  40 min    Behavior During Therapy  [facility] for tasks assessed/performed       No past medical history on file.  No past surgical history on file.  There were no vitals filed for this visit.  Subjective Assessment - 06/03/18 1752    Subjective   Denies pain    Patient Stated Goals  regain use of RUE    Currently in Pain?  No/denies                           OT Education - 06/03/18 1750    Education Details  coordination HEP-see pt instructions, pt returned demo with verbal cueing and demonstration, foam grip for utensils and pen, Pt demonstrates ability to write legibilly with foam grip    Person(s) Educated  Patient    Methods  Explanation;Demonstration;Verbal cues;Handout    Comprehension  Verbalized understanding;Returned demonstration       OT Short Term Goals - 05/22/18 1642      OT SHORT TERM GOAL #1   Title  I with HEP due 06/12/18    Time  3    Period  Weeks    Status  New    Target Date  06/12/18      OT SHORT TERM GOAL #2   Title  Pt will demonstrate improved RUE functional use as evidenced by increasing  box/ blocks score by 5 blocks.    Baseline  RUE 45 blocks, LUE 68 blocks    Time  3    Period  Weeks    Status  New      OT SHORT TERM GOAL #3   Title  Pt will report she is completing basic ADLS with increased ease in a timely manner.    Time  3    Period  Weeks    Status  New        OT Long Term Goals - 05/22/18  1650      OT LONG TERM GOAL #1   Title  Pt will resume use of RUE as her dominant hand at least 75% of the time for ADLs/IADLs.- due 07/21/18    Baseline  does not use RUE consistently    Time  6    Period  Weeks    Status  New    Target Date  07/21/18      OT LONG TERM GOAL #2   Title  Pt will demonstrate improved RUE strength for ADLs as evidenced by increasing grip strength to 50 lbs or greater for RUE.    Baseline  RUE 45 lbs, LUe 65 lbs    Time  6    Period  Weeks    Status  New      OT LONG TERM GOAL #3   Title  Pt will demonstrate improved RUE strength and functional use as  evidenced by retrieving a 3 lbs weight from overhead shelf with RUE x 5 reps without drops.    Time  6    Period  Weeks    Status  New            Plan - 06/03/18 1453    Clinical Impression Statement  Pt is progressing towards goals. She demonstrates understanding of inital coordination HEP and use of a foam grip on pen for writing.    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, PMH: HTN    Occupational performance deficits (Please refer to evaluation for details):  ADL's;IADL's;Work;Play;Leisure;Social Participation    Rehab Potential  Good    Current Impairments/barriers affecting progress:  no clear PD diagnosis, functional movement disorder diagnosis    OT Frequency  2x / week    OT Duration  6 weeks    OT Treatment/Interventions  Self-care/ADL training;Therapeutic exercise;Patient/family education;Neuromuscular education;Moist Heat;Paraffin;Fluidtherapy;Energy conservation;Therapeutic activities;Balance training;Cognitive remediation/compensation;Manual Therapy;Contrast Bath;Ultrasound;Cryotherapy;DME and/or AE instruction    Plan  RUE functional use, ADL strategies    Consulted and Agree with Plan of Care  Patient       Patient will benefit from  skilled therapeutic intervention in order to improve the following deficits and impairments:  Abnormal gait, Decreased cognition, Impaired flexibility, Impaired UE functional use, Impaired perceived functional ability, Difficulty walking, Decreased safety awareness, Decreased knowledge of precautions, Decreased balance, Decreased activity tolerance, Decreased endurance, Decreased range of motion, Decreased strength, Decreased coordination, Impaired sensation, Decreased knowledge of use of DME  Visit Diagnosis: Muscle weakness (generalized)  Other lack of coordination  Unsteadiness on feet    Problem List There are no active problems to display for this patient.   Gina Washington 06/03/2018, 5:53 PM  [facility] [address] [place], [place], [address] Phone: [phone]   Fax:  [phone]  Name: Gina Washington MRN: [id number] Date of Birth: 04-20-1966

## 2018-06-05 ENCOUNTER — Encounter: Payer: Self-pay | Admitting: Occupational Therapy

## 2018-06-05 ENCOUNTER — Ambulatory Visit: Payer: BC Managed Care – PPO | Admitting: Occupational Therapy

## 2018-06-05 DIAGNOSIS — R2681 Unsteadiness on feet: Secondary | ICD-10-CM | POA: Diagnosis not present

## 2018-06-05 DIAGNOSIS — M6281 Muscle weakness (generalized): Secondary | ICD-10-CM

## 2018-06-05 DIAGNOSIS — R278 Other lack of coordination: Secondary | ICD-10-CM

## 2018-06-05 NOTE — Therapy (Signed)
[facility] [address] [place], [place], [address] Phone: [phone]   Fax:  [phone]  Occupational Therapy Treatment  Patient Details  Name: Gina Washington MRN: [id number] Date of Birth: 05-08-1966 Referring Provider: Sonia Baller MD   Encounter Date: 06/05/2018  OT End of Session - 06/05/18 5427    Visit Number  3    Number of Visits  13    Authorization Type  BCBS    OT Start Time  0623    OT Stop Time  1530    OT Time Calculation (min)  45 min    Activity Tolerance  Patient tolerated treatment well    Behavior During Therapy  [facility] for tasks assessed/performed       History reviewed. No pertinent past medical history.  History reviewed. No pertinent surgical history.  There were no vitals filed for this visit.  Subjective Assessment - 06/05/18 1449    Subjective   It takes extra energy to think about it to make my body move rightt    Patient Stated Goals  regain use of RUE    Currently in Pain?  No/denies         [facility] OT Assessment - 06/05/18 0001      Coordination   Box and Blocks  RUE 49 blocks               OT Treatments/Exercises (OP) - 06/05/18 0001      Neurological Re-education Exercises   Other Exercises 1  Attempted weighted utensils to determine if this would influence coordination for hand to mouth pattern.  Patient indicated no diuscernable difference.  Reinforced the importance of "use it or lose it"         Fine Motor Coordination (Hand/Wrist)   Fine Motor Coordination  In hand manipuation training;Manipulation of small objects;Grooved pegs               OT Short Term Goals - 06/05/18 1645      OT SHORT TERM GOAL #1   Title  I with HEP due 06/12/18    Status  On-going      OT SHORT TERM GOAL #2   Title  Pt will demonstrate improved RUE functional use as evidenced by increasing  box/ blocks score by 5 blocks.    Status  On-going      OT SHORT TERM  GOAL #3   Title  Pt will report she is completing basic ADLS with increased ease in a timely manner.    Status  On-going        OT Long Term Goals - 05/22/18 1650      OT LONG TERM GOAL #1   Title  Pt will resume use of RUE as her dominant hand at least 75% of the time for ADLs/IADLs.- due 07/21/18    Baseline  does not use RUE consistently    Time  6    Period  Weeks    Status  New    Target Date  07/21/18      OT LONG TERM GOAL #2   Title  Pt will demonstrate improved RUE strength for ADLs as evidenced by increasing grip strength to 50 lbs or greater for RUE.    Baseline  RUE 45 lbs, LUe 65 lbs    Time  6    Period  Weeks    Status  New      OT LONG TERM GOAL #3   Title  Pt will demonstrate improved RUE strength and functional use as evidenced by retrieving a 3 lbs weight from overhead shelf with RUE x 5 reps without drops.    Time  6    Period  Weeks    Status  New            Plan - 06/05/18 1644    Clinical Impression Statement  Patient with functional movement disorder diagnosis.  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, PMH: HTN    Occupational performance deficits (Please refer to evaluation for details):  ADL's;IADL's;Work;Play;Leisure;Social Participation    Rehab Potential  Good    Current Impairments/barriers affecting progress:  no clear PD diagnosis, functional movement disorder diagnosis    OT Frequency  2x / week    OT Duration  6 weeks    OT Treatment/Interventions  Self-care/ADL training;Therapeutic exercise;Patient/family education;Neuromuscular education;Moist Heat;Paraffin;Fluidtherapy;Energy conservation;Therapeutic activities;Balance training;Cognitive remediation/compensation;Manual Therapy;Contrast Bath;Ultrasound;Cryotherapy;DME and/or AE instruction     Plan  RUE functional use, ADL strategies    Clinical Decision Making  Limited treatment options, no task modification necessary    Consulted and Agree with Plan of Care  Patient       Patient will benefit from skilled therapeutic intervention in order to improve the following deficits and impairments:     Visit Diagnosis: Muscle weakness (generalized)  Other lack of coordination  Unsteadiness on feet    Problem List There are no active problems to display for this patient.   Mariah Milling, OTR/L 06/05/2018, 4:46 PM  [facility] [address] [place], [place], [address] Phone: [phone]   Fax:  [phone]  Name: Gina Washington MRN: [id number] Date of Birth: 01-29-1966

## 2018-06-09 ENCOUNTER — Ambulatory Visit: Payer: BC Managed Care – PPO | Admitting: Occupational Therapy

## 2018-06-11 ENCOUNTER — Ambulatory Visit: Payer: BC Managed Care – PPO | Admitting: Occupational Therapy

## 2018-06-11 DIAGNOSIS — M6281 Muscle weakness (generalized): Secondary | ICD-10-CM

## 2018-06-11 DIAGNOSIS — R293 Abnormal posture: Secondary | ICD-10-CM

## 2018-06-11 DIAGNOSIS — R2681 Unsteadiness on feet: Secondary | ICD-10-CM | POA: Diagnosis not present

## 2018-06-11 DIAGNOSIS — R278 Other lack of coordination: Secondary | ICD-10-CM

## 2018-06-11 NOTE — Therapy (Signed)
[facility] [address] [place], [place], [address] Phone: [phone]   Fax:  [phone]  Occupational Therapy Treatment  Patient Details  Name: Gina Washington MRN: [id number] Date of Birth: Sep 24, 1966 Referring Provider: Sonia Baller MD   Encounter Date: 06/11/2018  OT End of Session - 06/11/18 1625    Visit Number  4    Number of Visits  13    Authorization Type  BCBS    OT Start Time  1530    OT Stop Time  1620    OT Time Calculation (min)  50 min    Activity Tolerance  Patient tolerated treatment well    Behavior During Therapy  [facility] for tasks assessed/performed       No past medical history on file.  No past surgical history on file.  There were no vitals filed for this visit.  Subjective Assessment - 06/11/18 1541    Subjective   It's better; I still have trouble brushing my teeth    Pertinent History  HTN    Patient Stated Goals  regain use of RUE    Currently in Pain?  No/denies                   OT Treatments/Exercises (OP) - 06/11/18 0001      ADLs   Grooming  Discussed trying to brush front teeth regularly, then turning on electric toothbrush to get back teeth. Encouraged pt to use both hands to wash hair, but to not use curling iron right now. Also discussed stabalizing upper arm to don make-up      Functional Reaching Activities   High Level  High level reaching RUE to place small pegs in pegboard on vertical surface copying peg design at 100% accuracy. Then removing pegs 3 at a time for in hand manipulation with mod difficulty, min drops and 1 rest break needed. Recommended stretching hand open if feeling cramped or tight.       Fine Motor Coordination (Hand/Wrist)   Fine Motor Coordination  --   Practiced braiding string for motor planning and bilateral integration      Also discussed posture and stretches for extension. Pt shown towel stretch.  Discussed progression of  exercises to make more challenging as things become easier including multi-tasking (physical & cognitive task, or 2 physical tasks simultaneously)        OT Short Term Goals - 06/05/18 1645      OT SHORT TERM GOAL #1   Title  I with HEP due 06/12/18    Status  On-going      OT SHORT TERM GOAL #2   Title  Pt will demonstrate improved RUE functional use as evidenced by increasing  box/ blocks score by 5 blocks.    Status  On-going      OT SHORT TERM GOAL #3   Title  Pt will report she is completing basic ADLS with increased ease in a timely manner.    Status  On-going        OT Long Term Goals - 05/22/18 1650      OT LONG TERM GOAL #1   Title  Pt will resume use of RUE as her dominant hand at least 75% of the time for ADLs/IADLs.- due 07/21/18    Baseline  does not use RUE consistently    Time  6    Period  Weeks    Status  New    Target Date  07/21/18      OT LONG TERM GOAL #2   Title  Pt will demonstrate improved RUE strength for ADLs as evidenced by increasing grip strength to 50 lbs or greater for RUE.    Baseline  RUE 45 lbs, LUe 65 lbs    Time  6    Period  Weeks    Status  New      OT LONG TERM GOAL #3   Title  Pt will demonstrate improved RUE strength and functional use as evidenced by retrieving a 3 lbs weight from overhead shelf with RUE x 5 reps without drops.    Time  6    Period  Weeks    Status  New            Plan - 06/11/18 1626    Clinical Impression Statement  Pt progressing with RUE function.     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, PMH: HTN    Occupational performance deficits (Please refer to evaluation for details):  ADL's;IADL's;Work;Play;Leisure;Social Participation    Rehab Potential  Good    OT Frequency  2x / week    OT Duration  6 weeks    OT Treatment/Interventions  Self-care/ADL  training;Therapeutic exercise;Patient/family education;Neuromuscular education;Moist Heat;Paraffin;Fluidtherapy;Energy conservation;Therapeutic activities;Balance training;Cognitive remediation/compensation;Manual Therapy;Contrast Bath;Ultrasound;Cryotherapy;DME and/or AE instruction    Plan  RUE functional use, ADL strategies    Consulted and Agree with Plan of Care  Patient       Patient will benefit from skilled therapeutic intervention in order to improve the following deficits and impairments:  Abnormal gait, Decreased cognition, Impaired flexibility, Impaired UE functional use, Impaired perceived functional ability, Difficulty walking, Decreased safety awareness, Decreased knowledge of precautions, Decreased balance, Decreased activity tolerance, Decreased endurance, Decreased range of motion, Decreased strength, Decreased coordination, Impaired sensation, Decreased knowledge of use of DME  Visit Diagnosis: Muscle weakness (generalized)  Other lack of coordination  Abnormal posture    Problem List There are no active problems to display for this patient.   Carey Bullocks, OTR/L 06/11/2018, 4:27 PM  [facility] [address] [place], [place], [address] Phone: [phone]   Fax:  [phone]  Name: Gina Washington MRN: [id number] Date of Birth: 05-30-1966

## 2018-06-16 ENCOUNTER — Ambulatory Visit: Payer: BC Managed Care – PPO | Admitting: Occupational Therapy

## 2018-06-18 ENCOUNTER — Ambulatory Visit: Payer: BC Managed Care – PPO | Attending: Neurology | Admitting: Occupational Therapy

## 2018-06-18 DIAGNOSIS — R293 Abnormal posture: Secondary | ICD-10-CM | POA: Insufficient documentation

## 2018-06-18 DIAGNOSIS — R278 Other lack of coordination: Secondary | ICD-10-CM

## 2018-06-18 DIAGNOSIS — R2681 Unsteadiness on feet: Secondary | ICD-10-CM

## 2018-06-18 DIAGNOSIS — M6281 Muscle weakness (generalized): Secondary | ICD-10-CM | POA: Diagnosis not present

## 2018-06-18 NOTE — Therapy (Signed)
[facility] [address] [place], [place], [address] Phone: [phone]   Fax:  [phone]  Occupational Therapy Treatment  Patient Details  Name: Denaja Verhoeven MRN: [id number] Date of Birth: 1966-05-20 Referring Provider: Sonia Baller MD   Encounter Date: 06/18/2018  OT End of Session - 06/18/18 0907    Visit Number  5    Number of Visits  13    Authorization Type  BCBS    OT Start Time  0850    OT Stop Time  0930    OT Time Calculation (min)  40 min    Activity Tolerance  Patient tolerated treatment well    Behavior During Therapy  [facility] for tasks assessed/performed       No past medical history on file.  No past surgical history on file.  There were no vitals filed for this visit.  Subjective Assessment - 06/18/18 0921    Subjective   Pt denies pain but she reports symptoms worsen when stressed    Pertinent History  HTN    Patient Stated Goals  regain use of RUE    Currently in Pain?  No/denies             Treatment: Fine motor coordination to place grooved pegs into pegboard with RUE, min v.c Ambulating while tossing a ball between hands, min v.c, min difficulty Handwriting activity with foam grip followed by writing a sentence with good legibility, pt performed hand stretches prior to task min v.c Typing test 35 wpm, 94% accuracy followed by typing activities for speed and accuracy.                 OT Short Term Goals - 06/18/18 0924      OT SHORT TERM GOAL #1   Title  I with HEP due 06/26/18 extended due to missed visits    Status  On-going      OT SHORT TERM GOAL #2   Title  Pt will demonstrate improved RUE functional use as evidenced by increasing  box/ blocks score by 5 blocks.    Status  On-going      OT SHORT TERM GOAL #3   Title  Pt will report she is completing basic ADLS with increased ease in a timely manner.    Status  On-going   continued difficulty with  shampooing hair, pulling up pants.       OT Long Term Goals - 05/22/18 1650      OT LONG TERM GOAL #1   Title  Pt will resume use of RUE as her dominant hand at least 75% of the time for ADLs/IADLs.- due 07/21/18    Baseline  does not use RUE consistently    Time  6    Period  Weeks    Status  New    Target Date  07/21/18      OT LONG TERM GOAL #2   Title  Pt will demonstrate improved RUE strength for ADLs as evidenced by increasing grip strength to 50 lbs or greater for RUE.    Baseline  RUE 45 lbs, LUe 65 lbs    Time  6    Period  Weeks    Status  New      OT LONG TERM GOAL #3   Title  Pt will demonstrate improved RUE strength and functional use as evidenced by retrieving a 3 lbs weight from overhead shelf with RUE x 5 reps without drops.  Time  6    Period  Weeks    Status  New            Plan - 06/18/18 1424    Clinical Impression Statement  Pt progressing with  improving RUe function. Pt reports stress has increased her symptoms.    Occupational performance deficits (Please refer to evaluation for details):  ADL's;IADL's;Work;Play;Leisure;Social Participation    Rehab Potential  Good    Current Impairments/barriers affecting progress:  no clear PD diagnosis, functional movement disorder diagnosis    OT Frequency  2x / week    OT Duration  6 weeks    OT Treatment/Interventions  Self-care/ADL training;Therapeutic exercise;Patient/family education;Neuromuscular education;Moist Heat;Paraffin;Fluidtherapy;Energy conservation;Therapeutic activities;Balance training;Cognitive remediation/compensation;Manual Therapy;Contrast Bath;Ultrasound;Cryotherapy;DME and/or AE instruction    Plan   ADL strategies pulling up pants, bilateral tasks, RUE functional use    Consulted and Agree with Plan of Care  Patient       Patient will benefit from skilled therapeutic intervention in order to improve the following deficits and impairments:  Abnormal gait, Decreased cognition,  Impaired flexibility, Impaired UE functional use, Impaired perceived functional ability, Difficulty walking, Decreased safety awareness, Decreased knowledge of precautions, Decreased balance, Decreased activity tolerance, Decreased endurance, Decreased range of motion, Decreased strength, Decreased coordination, Impaired sensation, Decreased knowledge of use of DME  Visit Diagnosis: Muscle weakness (generalized)  Other lack of coordination  Unsteadiness on feet    Problem List There are no active problems to display for this patient.   Demari Kropp 06/18/2018, 2:25 PM Theone Murdoch, OTR/L [phone] Phone: [phone] 2:32 PM 06/18/18 [facility] [address] [place], [place], [address] Phone: [phone]   Fax:  [phone]  Name: Casandra Dallaire MRN: [id number] Date of Birth: 1965-11-15

## 2018-06-18 NOTE — Patient Instructions (Signed)
1. Grip Strengthening (Resistive Putty)   Squeeze putty using thumb and all fingers. Repeat _20___ times. Do __2__ sessions per day.   2. Roll putty into tube on table and pinch between each finger and thumb x 10 reps each. (can do ring and small finger together)     Copyright  [employer]. All rights reserved.   

## 2018-06-23 ENCOUNTER — Ambulatory Visit: Payer: BC Managed Care – PPO | Admitting: Occupational Therapy

## 2018-06-23 DIAGNOSIS — M6281 Muscle weakness (generalized): Secondary | ICD-10-CM | POA: Diagnosis not present

## 2018-06-23 DIAGNOSIS — R278 Other lack of coordination: Secondary | ICD-10-CM

## 2018-06-23 NOTE — Therapy (Signed)
[facility] [address] [place], [place], [address] Phone: [phone]   Fax:  [phone]  Occupational Therapy Treatment  Patient Details  Name: Gina Washington MRN: [id number] Date of Birth: 07-09-1966 Referring Provider: Sonia Baller MD   Encounter Date: 06/23/2018  OT End of Session - 06/23/18 1600    Visit Number  6    Number of Visits  13    Authorization Type  BCBS    OT Start Time  1537    OT Stop Time  3474    OT Time Calculation (min)  38 min       No past medical history on file.  No past surgical history on file.  There were no vitals filed for this visit.  Subjective Assessment - 06/23/18 1711    Patient Stated Goals  regain use of RUE    Currently in Pain?  No/denies               Treatment: Pt was educated in supine and seated ball exercises, see pt instructions. Fine motor coordination task to place O'connor pegs into pegboard with RUE, occasional min difficulty.            OT Education - 06/23/18 1716    Education Details  updates to HEP, see pt instructions    Person(s) Educated  Patient    Methods  Explanation;Demonstration;Verbal cues;Handout    Comprehension  Verbalized understanding;Returned demonstration       OT Short Term Goals - 06/23/18 1603      OT SHORT TERM GOAL #1   Title  I with HEP due 06/26/18 extended due to missed visits    Status  Achieved      OT SHORT TERM GOAL #2   Title  Pt will demonstrate improved RUE functional use as evidenced by increasing  box/ blocks score by 5 blocks.    Status  On-going   41     OT SHORT TERM GOAL #3   Title  Pt will report she is completing basic ADLS with increased ease in a timely manner.    Status  On-going   continued difficulty with shampooing hair, pulling up pants.       OT Long Term Goals - 05/22/18 1650      OT LONG TERM GOAL #1   Title  Pt will resume use of RUE as her dominant hand at least  75% of the time for ADLs/IADLs.- due 07/21/18    Baseline  does not use RUE consistently    Time  6    Period  Weeks    Status  New    Target Date  07/21/18      OT LONG TERM GOAL #2   Title  Pt will demonstrate improved RUE strength for ADLs as evidenced by increasing grip strength to 50 lbs or greater for RUE.    Baseline  RUE 45 lbs, LUe 65 lbs    Time  6    Period  Weeks    Status  New      OT LONG TERM GOAL #3   Title  Pt will demonstrate improved RUE strength and functional use as evidenced by retrieving a 3 lbs weight from overhead shelf with RUE x 5 reps without drops.    Time  6    Period  Weeks    Status  New            Plan - 06/23/18 1711  Clinical Impression Statement  Pt progressing with improving RUE function. Pt reports she is trying to consciously use her RUE more often    Rehab Potential  Good    Current Impairments/barriers affecting progress:  no clear PD diagnosis, functional movement disorder diagnosis    OT Frequency  2x / week    OT Duration  6 weeks    OT Treatment/Interventions  Self-care/ADL training;Therapeutic exercise;Patient/family education;Neuromuscular education;Moist Heat;Paraffin;Fluidtherapy;Energy conservation;Therapeutic activities;Balance training;Cognitive remediation/compensation;Manual Therapy;Contrast Bath;Ultrasound;Cryotherapy;DME and/or AE instruction    Plan   ADL strategies, RUE functional use, check box/ blocks    Consulted and Agree with Plan of Care  Patient       Patient will benefit from skilled therapeutic intervention in order to improve the following deficits and impairments:  Abnormal gait, Decreased cognition, Impaired flexibility, Impaired UE functional use, Impaired perceived functional ability, Difficulty walking, Decreased safety awareness, Decreased knowledge of precautions, Decreased balance, Decreased activity tolerance, Decreased endurance, Decreased range of motion, Decreased strength, Decreased coordination,  Impaired sensation, Decreased knowledge of use of DME  Visit Diagnosis: Muscle weakness (generalized)  Other lack of coordination    Problem List There are no active problems to display for this patient.   Cheyla Duchemin 06/23/2018, 5:16 PM  [facility] [address] [place], [place], [address] Phone: [phone]   Fax:  [phone]  Name: Bhavika Schnider MRN: [id number] Date of Birth: [birth year]

## 2018-06-23 NOTE — Patient Instructions (Addendum)
Laying on your back hold ball between hands, perform chest press x 10 reps Then raise arms overhead with elbows straight 10 x for gentle stretch.  Seated hold a ball between your hands , raise arms up to just above shoulder height without elevating shoulder or arching back,then  lower ball back to your lap. Keep elbows straight.   Perform 2 sets of 10 reps 1x day  Seated hold a ball in between both hands, perform diagonals in each direction,   Perform 2 sets of 10 reps each direction 1x day  Seated hold a ball in each hand then toss them alternately, 10-20 reps

## 2018-06-30 ENCOUNTER — Ambulatory Visit: Payer: BC Managed Care – PPO | Admitting: Occupational Therapy

## 2018-06-30 DIAGNOSIS — M6281 Muscle weakness (generalized): Secondary | ICD-10-CM

## 2018-06-30 DIAGNOSIS — R2681 Unsteadiness on feet: Secondary | ICD-10-CM

## 2018-06-30 DIAGNOSIS — R293 Abnormal posture: Secondary | ICD-10-CM

## 2018-06-30 DIAGNOSIS — R278 Other lack of coordination: Secondary | ICD-10-CM

## 2018-06-30 NOTE — Therapy (Signed)
[facility] [address] [place], [place], [address] Phone: [phone]   Fax:  [phone]  Occupational Therapy Treatment  Patient Details  Name: Gina Washington MRN: [id number] Date of Birth: 05/02/1966 Referring Provider: Sonia Baller MD   Encounter Date: 06/30/2018  OT End of Session - 06/30/18 1649    Visit Number  7    Number of Visits  13    Authorization Type  BCBS    OT Start Time  1535    OT Stop Time  1615    OT Time Calculation (min)  40 min    Activity Tolerance  Patient tolerated treatment well    Behavior During Therapy  [facility] for tasks assessed/performed       No past medical history on file.  No past surgical history on file.  There were no vitals filed for this visit.  Subjective Assessment - 06/30/18 1648    Subjective   Pt denies pain but she reports symptoms worsen when stressed, pt reports stressors at home    Patient Stated Goals  regain use of RUE    Currently in Pain?  No/denies            Treatment:Pt was instructed in updated HEP, cat/ cow positions, rocking forwards and backwards in quadraped, 10-20 reps each. Min v.c Pt was issued handout. Lifiting alternate UE/LU in quadraped for weightbearing and core stability.  Copying small peg design with RUE with in hand manipulation, min difficulty. Writing on a vertical surface for simulated work activities then wiping clean with RUE. Arm bike x 5 mins level 3 for conditioning/ reciprocal movement.                 OT Short Term Goals - 06/23/18 1603      OT SHORT TERM GOAL #1   Title  I with HEP due 06/26/18 extended due to missed visits    Status  Achieved      OT SHORT TERM GOAL #2   Title  Pt will demonstrate improved RUE functional use as evidenced by increasing  box/ blocks score by 5 blocks.    Status  On-going   41     OT SHORT TERM GOAL #3   Title  Pt will report she is completing basic ADLS with  increased ease in a timely manner.    Status  On-going   continued difficulty with shampooing hair, pulling up pants.       OT Long Term Goals - 05/22/18 1650      OT LONG TERM GOAL #1   Title  Pt will resume use of RUE as her dominant hand at least 75% of the time for ADLs/IADLs.- due 07/21/18    Baseline  does not use RUE consistently    Time  6    Period  Weeks    Status  New    Target Date  07/21/18      OT LONG TERM GOAL #2   Title  Pt will demonstrate improved RUE strength for ADLs as evidenced by increasing grip strength to 50 lbs or greater for RUE.    Baseline  RUE 45 lbs, LUe 65 lbs    Time  6    Period  Weeks    Status  New      OT LONG TERM GOAL #3   Title  Pt will demonstrate improved RUE strength and functional use as evidenced by retrieving a 3 lbs weight from  overhead shelf with RUE x 5 reps without drops.    Time  6    Period  Weeks    Status  New            Plan - 06/30/18 1653    Clinical Impression Statement  Pt progressing with improving RUE function. Pt reports she is trying to consciously use her RUE more however she reports difficulty brushing her teeth.    Occupational performance deficits (Please refer to evaluation for details):  ADL's;IADL's;Work;Play;Leisure;Social Participation    Rehab Potential  Good    Current Impairments/barriers affecting progress:  no clear PD diagnosis, functional movement disorder diagnosis    OT Frequency  2x / week    OT Duration  6 weeks    OT Treatment/Interventions  Self-care/ADL training;Therapeutic exercise;Patient/family education;Neuromuscular education;Moist Heat;Paraffin;Fluidtherapy;Energy conservation;Therapeutic activities;Balance training;Cognitive remediation/compensation;Manual Therapy;Contrast Bath;Ultrasound;Cryotherapy;DME and/or AE instruction    Plan   ADL strategies, RUE functional use, check box/ blocks    Consulted and Agree with Plan of Care  Patient       Patient will benefit from  skilled therapeutic intervention in order to improve the following deficits and impairments:  Abnormal gait, Decreased cognition, Impaired flexibility, Impaired UE functional use, Impaired perceived functional ability, Difficulty walking, Decreased safety awareness, Decreased knowledge of precautions, Decreased balance, Decreased activity tolerance, Decreased endurance, Decreased range of motion, Decreased strength, Decreased coordination, Impaired sensation, Decreased knowledge of use of DME  Visit Diagnosis: Muscle weakness (generalized)  Other lack of coordination  Unsteadiness on feet  Abnormal posture    Problem List There are no active problems to display for this patient.   Kealan Buchan 06/30/2018, 4:54 PM  [facility] [address] [place], [place], [address] Phone: [phone]   Fax:  [phone]  Name: Gina [place] MRN: [id number] Date of Birth: [birth year]

## 2018-07-02 ENCOUNTER — Ambulatory Visit: Payer: BC Managed Care – PPO | Admitting: Occupational Therapy

## 2018-07-02 ENCOUNTER — Encounter: Payer: BC Managed Care – PPO | Admitting: Occupational Therapy

## 2018-07-02 DIAGNOSIS — R278 Other lack of coordination: Secondary | ICD-10-CM

## 2018-07-02 DIAGNOSIS — M6281 Muscle weakness (generalized): Secondary | ICD-10-CM

## 2018-07-02 DIAGNOSIS — R2681 Unsteadiness on feet: Secondary | ICD-10-CM

## 2018-07-02 NOTE — Therapy (Signed)
[facility] [address] [place], [place], [address] Phone: [phone]   Fax:  [phone]  Occupational Therapy Treatment  Patient Details  Name: Gina Washington MRN: [id number] Date of Birth: 1966/08/06 Referring Provider: Sonia Baller MD   Encounter Date: 07/02/2018  OT End of Session - 07/02/18 0837    Visit Number  8    Number of Visits  13    Authorization Type  BCBS    OT Start Time  0805    OT Stop Time  0845    OT Time Calculation (min)  40 min    Activity Tolerance  Patient tolerated treatment well    Behavior During Therapy  [facility] for tasks assessed/performed       No past medical history on file.  No past surgical history on file.  There were no vitals filed for this visit.  Subjective Assessment - 07/02/18 0837    Patient Stated Goals  regain use of RUE    Currently in Pain?  No/denies              Treatment:  cat/ cow positions, rocking forwards and backwards in quadraped, 10-20 reps each. Lifiting alternate UE/LU in quadraped for weightbearing and core stability.  Placing O'connor pegs with tweezers with RUE followed by removing pegs with in hand manipulation, min difficulty. Arm bike 6 mins level 3 for conditioning/ reciprocal movement. Dribbling ball with RUE, min-mod difficulty Shoulder flexion and diagonals each direction with medium ball, closed chain 10-20 reps each, min v.c Pt was encouraged to use RUE for all safe task possible to avoiding heavy or breakable items as she has learned non-use                OT Short Term Goals - 06/23/18 1603      OT SHORT TERM GOAL #1   Title  I with HEP due 06/26/18 extended due to missed visits    Status  Achieved      OT SHORT TERM GOAL #2   Title  Pt will demonstrate improved RUE functional use as evidenced by increasing  box/ blocks score by 5 blocks.    Status  On-going   41     OT SHORT TERM GOAL #3   Title  Pt will  report she is completing basic ADLS with increased ease in a timely manner.    Status  On-going   continued difficulty with shampooing hair, pulling up pants.       OT Long Term Goals - 05/22/18 1650      OT LONG TERM GOAL #1   Title  Pt will resume use of RUE as her dominant hand at least 75% of the time for ADLs/IADLs.- due 07/21/18    Baseline  does not use RUE consistently    Time  6    Period  Weeks    Status  New    Target Date  07/21/18      OT LONG TERM GOAL #2   Title  Pt will demonstrate improved RUE strength for ADLs as evidenced by increasing grip strength to 50 lbs or greater for RUE.    Baseline  RUE 45 lbs, LUe 65 lbs    Time  6    Period  Weeks    Status  New      OT LONG TERM GOAL #3   Title  Pt will demonstrate improved RUE strength and functional use as evidenced by retrieving a  3 lbs weight from overhead shelf with RUE x 5 reps without drops.    Time  6    Period  Weeks    Status  New            Plan - 07/02/18 8527    Clinical Impression Statement  Pt progressing with improving RUE function. Pt reports using her RUE to brush her teeth yesterday    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, PMH: HTN    Occupational performance deficits (Please refer to evaluation for details):  ADL's;IADL's;Work;Play;Leisure;Social Participation    Rehab Potential  Good    Current Impairments/barriers affecting progress:  no clear PD diagnosis, functional movement disorder diagnosis    OT Frequency  2x / week    OT Duration  6 weeks    OT Treatment/Interventions  Self-care/ADL training;Therapeutic exercise;Patient/family education;Neuromuscular education;Moist Heat;Paraffin;Fluidtherapy;Energy conservation;Therapeutic activities;Balance training;Cognitive remediation/compensation;Manual Therapy;Contrast  Bath;Ultrasound;Cryotherapy;DME and/or AE instruction    Plan  continue RUE functional use, strength and coordination    Consulted and Agree with Plan of Care  Patient       Patient will benefit from skilled therapeutic intervention in order to improve the following deficits and impairments:  Abnormal gait, Decreased cognition, Impaired flexibility, Impaired UE functional use, Impaired perceived functional ability, Difficulty walking, Decreased safety awareness, Decreased knowledge of precautions, Decreased balance, Decreased activity tolerance, Decreased endurance, Decreased range of motion, Decreased strength, Decreased coordination, Impaired sensation, Decreased knowledge of use of DME  Visit Diagnosis: Muscle weakness (generalized)  Other lack of coordination    Problem List There are no active problems to display for this patient.   RINE,KATHRYN 07/02/2018, 8:42 AM  [facility] [address] [place], [place], [address] Phone: [phone]   Fax:  [phone]  Name: Gina [place] MRN: [id number] Date of Birth: 1966-06-29

## 2018-07-07 ENCOUNTER — Ambulatory Visit: Payer: BC Managed Care – PPO | Admitting: Occupational Therapy

## 2018-07-07 ENCOUNTER — Encounter: Payer: BC Managed Care – PPO | Admitting: Occupational Therapy

## 2018-07-07 DIAGNOSIS — M6281 Muscle weakness (generalized): Secondary | ICD-10-CM | POA: Diagnosis not present

## 2018-07-07 DIAGNOSIS — R278 Other lack of coordination: Secondary | ICD-10-CM

## 2018-07-07 DIAGNOSIS — R2681 Unsteadiness on feet: Secondary | ICD-10-CM

## 2018-07-07 NOTE — Therapy (Signed)
[facility] [address] [place], [place], [address] Phone: [phone]   Fax:  [phone]  Occupational Therapy Treatment  Patient Details  Name: Gina Washington MRN: [id number] Date of Birth: [birth year] Referring Provider: Sonia Baller MD   Encounter Date: 07/07/2018  OT End of Session - 07/07/18 0835    Visit Number  9    Number of Visits  13    Authorization Type  BCBS    OT Start Time  0805    OT Stop Time  0845    OT Time Calculation (min)  40 min    Activity Tolerance  Patient tolerated treatment well    Behavior During Therapy  [facility] for tasks assessed/performed       No past medical history on file.  No past surgical history on file.  There were no vitals filed for this visit.  Subjective Assessment - 07/07/18 0832    Patient Stated Goals  regain use of RUE    Currently in Pain?  No/denies              Treatment: Flipping and dealing cards with RUE for increased functional use, min v.c Rotating a ball with RUE, mod difficulty/ v.c Cat and cow followed  By rocking forwards and backward in quadraped, for weightbearing and increased strength Copying small peg design in standing with in hand manipulation min difficulty, v.c Arm bike x 6 mins level 3 for conditioning, reciprocal movement  Dribbling ball with RUE while ambulating, min difficulty/ v.c              OT Short Term Goals - 07/07/18 0836      OT SHORT TERM GOAL #1   Title  I with HEP due 06/26/18 extended due to missed visits    Status  Achieved      OT SHORT TERM GOAL #2   Title  Pt will demonstrate improved RUE functional use as evidenced by increasing  box/ blocks score by 5 blocks.    Status  Achieved   55 lbs     OT SHORT TERM GOAL #3   Title  Pt will report she is completing basic ADLS with increased ease in a timely manner.    Status  On-going   continued difficulty with shampooing hair, pulling up pants.       OT Long Term Goals - 05/22/18 1650      OT LONG TERM GOAL #1   Title  Pt will resume use of RUE as her dominant hand at least 75% of the time for ADLs/IADLs.- due 07/21/18    Baseline  does not use RUE consistently    Time  6    Period  Weeks    Status  New    Target Date  07/21/18      OT LONG TERM GOAL #2   Title  Pt will demonstrate improved RUE strength for ADLs as evidenced by increasing grip strength to 50 lbs or greater for RUE.    Baseline  RUE 45 lbs, LUe 65 lbs    Time  6    Period  Weeks    Status  New      OT LONG TERM GOAL #3   Title  Pt will demonstrate improved RUE strength and functional use as evidenced by retrieving a 3 lbs weight from overhead shelf with RUE x 5 reps without drops.    Time  6    Period  Weeks  Status  New            Plan - 07/07/18 3762    Clinical Impression Statement  Pt progressing with improving RUE function. Pt reports using her RUE more however it takes her longer.    Occupational performance deficits (Please refer to evaluation for details):  ADL's;IADL's;Work;Play;Leisure;Social Participation    Rehab Potential  Good    Current Impairments/barriers affecting progress:  no clear PD diagnosis, functional movement disorder diagnosis    OT Frequency  2x / week    OT Duration  6 weeks    OT Treatment/Interventions  Self-care/ADL training;Therapeutic exercise;Patient/family education;Neuromuscular education;Moist Heat;Paraffin;Fluidtherapy;Energy conservation;Therapeutic activities;Balance training;Cognitive remediation/compensation;Manual Therapy;Contrast Bath;Ultrasound;Cryotherapy;DME and/or AE instruction    Plan  continue RUE functional use, strength and coordination, anticipate d/c next week    Consulted and Agree with Plan of Care  Patient       Patient will benefit from skilled therapeutic intervention in order to improve the following deficits and impairments:  Abnormal gait, Decreased cognition, Impaired flexibility,  Impaired UE functional use, Impaired perceived functional ability, Difficulty walking, Decreased safety awareness, Decreased knowledge of precautions, Decreased balance, Decreased activity tolerance, Decreased endurance, Decreased range of motion, Decreased strength, Decreased coordination, Impaired sensation, Decreased knowledge of use of DME  Visit Diagnosis: Muscle weakness (generalized)  Other lack of coordination  Unsteadiness on feet    Problem List There are no active problems to display for this patient.   Gina Washington 07/07/2018, 8:37 AM Theone Murdoch, OTR/L [phone] Phone: [phone] 8:38 AM 07/07/18 [facility] [address] [place], [place], [address] Phone: [phone]   Fax:  [phone]  Name: Gina Washington MRN: [id number] Date of Birth: May 02, 1966

## 2018-07-09 ENCOUNTER — Encounter: Payer: BC Managed Care – PPO | Admitting: Occupational Therapy

## 2018-07-10 ENCOUNTER — Ambulatory Visit: Payer: BC Managed Care – PPO | Admitting: Occupational Therapy

## 2018-07-10 DIAGNOSIS — R2681 Unsteadiness on feet: Secondary | ICD-10-CM

## 2018-07-10 DIAGNOSIS — R278 Other lack of coordination: Secondary | ICD-10-CM

## 2018-07-10 DIAGNOSIS — M6281 Muscle weakness (generalized): Secondary | ICD-10-CM

## 2018-07-10 NOTE — Patient Instructions (Signed)
  Strengthening: Resisted Flexion   Hold tubing with _____ arm(s) at side. Pull forward and up. Move shoulder through pain-free range of motion. Repeat __10__ times per set.  Do _1-2_ sessions per day , every other day   Strengthening: Resisted Extension   Hold tubing in _____ hand(s), arm forward. Pull arm back, elbow straight. Repeat _10___ times per set. Do _1-2___ sessions per day, every other day.   Resisted Horizontal Abduction: Bilateral   Sit or stand, tubing in both hands, arms out in front. Keeping arms straight, pinch shoulder blades together and stretch arms out. Repeat _10___ times per set. Do _1-2___ sessions per day, every other day.   Elbow Flexion: Resisted   With tubing held in ______ hand(s) and other end secured under foot, curl arm up as far as possible. Repeat _10___ times per set. Do _1-2___ sessions per day, every other day.    Elbow Extension: Resisted    Copyright  VHI. All rights reserved.

## 2018-07-10 NOTE — Therapy (Signed)
[facility] [address] [place], [place], [address] Phone: [phone]   Fax:  [phone]  Occupational Therapy Treatment  Patient Details  Name: Gina Washington MRN: [id number] Date of Birth: 1966/08/02 No data recorded  Encounter Date: 07/10/2018  OT End of Session - 07/10/18 1636    Visit Number  10    Number of Visits  13    Authorization Type  BC[employer]    OT Start Time  [phone]    OT Stop Time  0845    OT Time Calculation (min)  39 min       No past medical history on file.  No past surgical history on file.  There were no vitals filed for this visit.  Subjective Assessment - 07/10/18 1646    Subjective   Pt reports being very stressed     Patient Stated Goals  regain use of RUE    Currently in Pain?  No/denies               Treatment: Pt arrived very emotional. While in a private room pt became tearful reporting difficulty keeping "it all together". Pt reports her husband is having mental health issues, father in law is in hospital and her work is adding stress. Therapist recommended pt contacts HR for info on FMLA and therapist recommends pt takes the paperwork with her to her neurologist appointment next week. Therapist provided reassurance and made recommendations of ways pt can manage/dlegate responsibilities. Quadraped cat/ cow, followed by rocking forwards and backwards for UE strength, core stability. Wall slides and wall pushups, min v.c Red theraband HEP issued, see pt instructions, min v.c               OT Short Term Goals - 07/07/18 0836      OT SHORT TERM GOAL #1   Title  I with HEP due 06/26/18 extended due to missed visits    Status  Achieved      OT SHORT TERM GOAL #2   Title  Pt will demonstrate improved RUE functional use as evidenced by increasing  box/ blocks score by 5 blocks.    Status  Achieved   55 lbs     OT SHORT TERM GOAL #3   Title  Pt will report she is  completing basic ADLS with increased ease in a timely manner.    Status  On-going   continued difficulty with shampooing hair, pulling up pants.       OT Long Term Goals - 05/22/18 1650      OT LONG TERM GOAL #1   Title  Pt will resume use of RUE as her dominant hand at least 75% of the time for ADLs/IADLs.- due 07/21/18    Baseline  does not use RUE consistently    Time  6    Period  Weeks    Status  New    Target Date  07/21/18      OT LONG TERM GOAL #2   Title  Pt will demonstrate improved RUE strength for ADLs as evidenced by increasing grip strength to 50 lbs or greater for RUE.    Baseline  RUE 45 lbs, LUe 65 lbs    Time  6    Period  Weeks    Status  New      OT LONG TERM GOAL #3   Title  Pt will demonstrate improved RUE strength and functional use as evidenced by retrieving a 3 lbs  weight from overhead shelf with RUE x 5 reps without drops.    Time  6    Period  Weeks    Status  New            Plan - 07/10/18 1637    Clinical Impression Statement  Pt was very emotional today, reporting she is having a hard time keeping "it all together". Therapist discussed with pt that she may want to consider intermittant FMLA.    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, PMH: HTN    Occupational performance deficits (Please refer to evaluation for details):  ADL's;IADL's;Work;Play;Leisure;Social Participation    Rehab Potential  Good    Current Impairments/barriers affecting progress:  no clear PD diagnosis, functional movement disorder diagnosis    OT Frequency  2x / week    OT Duration  6 weeks    OT Treatment/Interventions  Self-care/ADL training;Therapeutic exercise;Patient/family education;Neuromuscular education;Moist Heat;Paraffin;Fluidtherapy;Energy conservation;Therapeutic activities;Balance training;Cognitive  remediation/compensation;Manual Therapy;Contrast Bath;Ultrasound;Cryotherapy;DME and/or AE instruction    Plan  continue RUE functional use, strength and coordination, discuss plans for renewal vs d/c    Consulted and Agree with Plan of Care  Patient       Patient will benefit from skilled therapeutic intervention in order to improve the following deficits and impairments:  Abnormal gait, Decreased cognition, Impaired flexibility, Impaired UE functional use, Impaired perceived functional ability, Difficulty walking, Decreased safety awareness, Decreased knowledge of precautions, Decreased balance, Decreased activity tolerance, Decreased endurance, Decreased range of motion, Decreased strength, Decreased coordination, Impaired sensation, Decreased knowledge of use of DME  Visit Diagnosis: Muscle weakness (generalized)  Other lack of coordination  Unsteadiness on feet    Problem List There are no active problems to display for this patient.   RINE,KATHRYN 07/10/2018, 4:47 PM  [facility] [address] [place], [place], [address] Phone: [phone]   Fax:  [phone]  Name: Gina Washington MRN: [id number] Date of Birth: Aug 25, 1966

## 2018-07-14 ENCOUNTER — Encounter: Payer: BC Managed Care – PPO | Admitting: Occupational Therapy

## 2018-07-15 ENCOUNTER — Ambulatory Visit: Payer: BC Managed Care – PPO | Admitting: Occupational Therapy

## 2018-07-19 DIAGNOSIS — F902 Attention-deficit hyperactivity disorder, combined type: Secondary | ICD-10-CM | POA: Insufficient documentation

## 2018-07-19 DIAGNOSIS — F411 Generalized anxiety disorder: Secondary | ICD-10-CM

## 2018-07-22 ENCOUNTER — Encounter: Payer: Self-pay | Admitting: Occupational Therapy

## 2018-07-22 ENCOUNTER — Ambulatory Visit: Payer: BC Managed Care – PPO | Attending: Neurology | Admitting: Occupational Therapy

## 2018-07-22 DIAGNOSIS — M6281 Muscle weakness (generalized): Secondary | ICD-10-CM

## 2018-07-22 DIAGNOSIS — R278 Other lack of coordination: Secondary | ICD-10-CM | POA: Diagnosis present

## 2018-07-22 DIAGNOSIS — R2681 Unsteadiness on feet: Secondary | ICD-10-CM | POA: Diagnosis present

## 2018-07-23 NOTE — Therapy (Signed)
[facility] [address] [place], [place], [address] Phone: [phone]   Fax:  [phone]  Occupational Therapy Treatment  Patient Details  Name: Gina Washington MRN: [id number] Date of Birth: 05/13/1966 No data recorded  Encounter Date: 07/22/2018  OT End of Session - 07/23/18 1620    Visit Number  10    Number of Visits  13    Authorization Type  [employer]BS    OT Start Time  1620    OT Stop Time  1700    OT Time Calculation (min)  40 min       History reviewed. No pertinent past medical history.  History reviewed. No pertinent surgical history.  There were no vitals filed for this visit.  Subjective Assessment - 07/22/18 1625    Patient Stated Goals  regain use of RUE    Currently in Pain?  No/denies              Treatment:Quadraped cat/ cow, followed by rocking forwards and backwards then lifting alternate UE/ LE for UE strength, core stability. Ambulatingwhile tossing ball between hands and performing a cognitive task. Seated twist and clap, followed by rock for UE stretch and trunk rotation, min v.c Checked progress towards goals, and discussed with pt. She has made excellent overall progress.                 OT Short Term Goals - 07/22/18 1656      OT SHORT TERM GOAL #1   Title  I with HEP due 06/26/18 extended due to missed visits    Status  Achieved      OT SHORT TERM GOAL #2   Title  Pt will demonstrate improved RUE functional use as evidenced by increasing  box/ blocks score by 5 blocks.      OT SHORT TERM GOAL #3   Title  Pt will report she is completing basic ADLS with increased ease in a timely manner.    Status  Achieved        OT Long Term Goals - 07/22/18 1651      OT LONG TERM GOAL #1   Title  Pt will resume use of RUE as her dominant hand at least 75% of the time for ADLs/IADLs.- due 07/21/18    Status  Not Met   at least 50%     OT LONG TERM GOAL #2   Title  Pt  will demonstrate improved RUE strength for ADLs as evidenced by increasing grip strength to 50 lbs or greater for RUE.    Status  Achieved   81 lbs, 93 lbs     OT LONG TERM GOAL #3   Title  Pt will demonstrate improved RUE strength and functional use as evidenced by retrieving a 3 lbs weight from overhead shelf with RUE x 5 reps without drops.    Status  Achieved            Plan - 07/23/18 1617    Clinical Impression Statement  Pt made excellent progress overall. therapsit discussed with pt placing therapy on hold at this time. Pt is going to be working with neuropsych. Pt was instructed that therapist will keep char opn x 30 days and pt can call if she needs 1 -2 additional visits.    Occupational Profile and client history currently impacting functional performance  Pt works as a special ed teacher, She reports increased difficulty with handwriting and ADL performance, PMH: HTN      Rehab Potential  Good    Current Impairments/barriers affecting progress:  no clear PD diagnosis, functional movement disorder diagnosis    OT Frequency  2x / week    OT Duration  6 weeks    OT Treatment/Interventions  Self-care/ADL training;Therapeutic exercise;Patient/family education;Neuromuscular education;Moist Heat;Paraffin;Fluidtherapy;Energy conservation;Therapeutic activities;Balance training;Cognitive remediation/compensation;Manual Therapy;Contrast Bath;Ultrasound;Cryotherapy;DME and/or AE instruction    Plan  pt placed on hold, plan to d/c if pt does not return in 30 days    Consulted and Agree with Plan of Care  Patient       Patient will benefit from skilled therapeutic intervention in order to improve the following deficits and impairments:  Abnormal gait, Decreased cognition, Impaired flexibility, Impaired UE functional use, Impaired perceived functional ability, Difficulty walking, Decreased safety awareness, Decreased knowledge of precautions, Decreased balance, Decreased activity tolerance,  Decreased endurance, Decreased range of motion, Decreased strength, Decreased coordination, Impaired sensation, Decreased knowledge of use of DME  Visit Diagnosis: Muscle weakness (generalized)  Other lack of coordination  Unsteadiness on feet    Problem List Patient Active Problem List   Diagnosis Date Noted  . Attention deficit hyperactivity disorder, combined type 07/19/2018  . Generalized anxiety disorder 07/19/2018    , 07/23/2018, 4:21 PM  [facility] [address] [place], [place], [address] Phone: [phone]   Fax:  [phone]  Name: Lilou Evenson MRN: [id number] Date of Birth: 12/21/1965 

## 2018-07-27 ENCOUNTER — Ambulatory Visit: Payer: Self-pay | Admitting: Mental Health

## 2018-07-29 ENCOUNTER — Encounter: Payer: BC Managed Care – PPO | Admitting: Occupational Therapy

## 2018-07-30 ENCOUNTER — Encounter: Payer: Self-pay | Admitting: Psychiatry

## 2018-07-30 ENCOUNTER — Ambulatory Visit: Payer: BC Managed Care – PPO | Admitting: Psychiatry

## 2018-07-30 VITALS — BP 142/89 | HR 81

## 2018-07-30 DIAGNOSIS — F330 Major depressive disorder, recurrent, mild: Secondary | ICD-10-CM

## 2018-07-30 DIAGNOSIS — F902 Attention-deficit hyperactivity disorder, combined type: Secondary | ICD-10-CM

## 2018-07-30 DIAGNOSIS — F411 Generalized anxiety disorder: Secondary | ICD-10-CM | POA: Diagnosis not present

## 2018-07-30 MED ORDER — AMPHETAMINE-DEXTROAMPHET ER 20 MG PO CP24: 20.0000 mg | ORAL_CAPSULE | Freq: Every day | ORAL | 0 refills | Status: DC

## 2018-07-30 MED ORDER — VORTIOXETINE HBR 10 MG PO TABS: 10.0000 mg | ORAL_TABLET | Freq: Every day | ORAL | 0 refills | Status: DC

## 2018-07-30 NOTE — Progress Notes (Signed)
Gina Washington [id number] 02-25-1966 52 y.o.  Subjective:   Patient ID:  Gina Washington is a 52 y.o. (DOB July 09, 1966) female.  Chief Complaint:  Chief Complaint  Patient presents with  . Anxiety  . ADD    HPI Gina Washington presents to the office today for follow-up of anxiety, depression, and ADD. She denies any significant improvement in mood or anxiety with Lexapro and has had side effects. She reports that her husband had a psychiatric hospital admission in early September and has been out of work. Reports that her husband had been doing better until several days ago and has been irritable the last few days. She reports that husband's mood and behavior triggers her anxiety- "I don't know what I am coming home too." Reports that she has been having increased work stress.   Reports that her gait and tremor were improving and then worsened again with increased stress. Reports OT and PT has been helpful for movement d/o s/s. Notices some brief periods of nausea with anxiety. She occasionally notices some mild panic s/s with heart fluttering and feeling nervous. Denies any full blown panic attacks.Denies excessive worry.   "I don't practice any self care." Denies having any hobbies. She reports that she does not feel depressed, however notices that things she would normally keep up with do not matter to her. Reports motivation has been low. Reports energy is better than she would expect and feels "more mentally tired." She reports that she has been sleeping well. Appetite is stable. Reports that she has frequently been losing her train of thought. Reports having difficulty prioritizing things and being productive.  Denies SI.   She reports that her pharmacist told her that she is no longer allowed to use Vyvanse savings card with her health plan. Pt reports that Vyvanse is now cost prohibitive and would like to switch back to Adderall XR.  Has an appointment to see a Functional Movement Disorders  counselor.   Medications: I have reviewed the patient's current medications.  Medication Side Effects: Other: Sexual side effects  Allergies:  Allergies  Allergen Reactions  . Bactrim [Sulfamethoxazole-Trimethoprim] Hives  . Codeine Nausea And Vomiting    Past Medical History:  Diagnosis Date  . Hypertension   . Migraine     No family history on file.  Social History   Socioeconomic History  . Marital status: Unknown    Spouse name: Not on file  . Number of children: Not on file  . Years of education: Not on file  . Highest education level: Not on file  Occupational History  . Not on file  Social Needs  . Financial resource strain: Not on file  . Food insecurity:    Worry: Not on file    Inability: Not on file  . Transportation needs:    Medical: Not on file    Non-medical: Not on file  Tobacco Use  . Smoking status: Former Research scientist (life sciences)  . Smokeless tobacco: Never Used  Substance and Sexual Activity  . Alcohol use: Not on file  . Drug use: Not on file  . Sexual activity: Not on file  Lifestyle  . Physical activity:    Days per week: Not on file    Minutes per session: Not on file  . Stress: Not on file  Relationships  . Social connections:    Talks on phone: Not on file    Gets together: Not on file    Attends religious service: Not on file  Active member of club or organization: Not on file    Attends meetings of clubs or organizations: Not on file    Relationship status: Not on file  . Intimate partner violence:    Fear of current or ex partner: Not on file    Emotionally abused: Not on file    Physically abused: Not on file    Forced sexual activity: Not on file  Other Topics Concern  . Not on file  Social History Narrative  . Not on file    Past Medical History, Surgical history, Social history, and Family history were reviewed and updated as appropriate.   Please see review of systems for further details on the patient's review from today.    Review of Systems:  Review of Systems  Gastrointestinal: Positive for nausea.  Neurological: Positive for tremors and weakness.  Psychiatric/Behavioral: Negative for suicidal ideas. The patient is nervous/anxious.     Objective:   Physical Exam:  BP (!) 142/89   Pulse 81   Physical Exam  Constitutional: She is oriented to person, place, and time. She appears well-developed. No distress.  Neurological: She is alert and oriented to person, place, and time. Coordination normal.  Foot drag noted on exam  Psychiatric: Her speech is normal and behavior is normal. Judgment and thought content normal. Her mood appears anxious. Her affect is not angry, not blunt, not labile and not inappropriate. Cognition and memory are normal. She exhibits a depressed mood. She expresses no homicidal and no suicidal ideation. She expresses no suicidal plans and no homicidal plans.    Lab Review:  No results found for: NA, K, CL, CO2, GLUCOSE, BUN, CREATININE, CALCIUM, PROT, ALBUMIN, AST, ALT, ALKPHOS, BILITOT, GFRNONAA, GFRAA  No results found for: WBC, RBC, HGB, HCT, PLT, MCV, MCH, MCHC, RDW, LYMPHSABS, MONOABS, EOSABS, BASOSABS  No results found for: POCLITH, LITHIUM   No results found for: PHENYTOIN, PHENOBARB, VALPROATE, CBMZ   .res Assessment: Plan:   Patient seen for 30 minutes and greater than 50% of visit spent counseling patient regarding treatment options to include changing Vyvanse back to Adderall XR due to cost.  Will resume Adderall XR 20 mg in the morning for attention deficit.  Also discussed alternative treatment options for depression and anxiety since patient has had minimal improvement with Lexapro and is experiencing sexual side effects.  Discussed potential benefits, risks, and side effects of several treatment options to include Cymbalta and Trintellix.  Patient reports that she would like to start trial of Trintellix.  Advised patient to take Trintellix with food to minimize risk  of GI side effects.  Patient to start Trintellix 10 mg daily for depression.  Advised patient to take Lexapro 10 mg one half tab daily for 1 week and then discontinue. Attention deficit hyperactivity disorder, combined type - Plan: amphetamine-dextroamphetamine (ADDERALL XR) 20 MG 24 hr capsule, amphetamine-dextroamphetamine (ADDERALL XR) 20 MG 24 hr capsule  Generalized anxiety disorder  Mild episode of recurrent major depressive disorder ([place]) - Plan: vortioxetine HBr (TRINTELLIX) 10 MG TABS tablet  Please see After Visit Summary for patient specific instructions.  Future Appointments  Date Time Provider [place]  09/18/2018  3:30 PM Thayer Headings, PMHNP [facility] None    No orders of the defined types were placed in this encounter.     -------------------------------

## 2018-07-30 NOTE — Patient Instructions (Signed)
Take 1/2 tab of Lexapro 10 mg daily for one week, then stop.   Start Trintellix 10 mg daily with food (while still taking Lexapro 5 mg).

## 2018-08-03 ENCOUNTER — Ambulatory Visit: Payer: Self-pay | Admitting: Mental Health

## 2018-08-05 ENCOUNTER — Encounter: Payer: BC Managed Care – PPO | Admitting: Occupational Therapy

## 2018-09-02 ENCOUNTER — Encounter: Payer: Self-pay | Admitting: Emergency Medicine

## 2018-09-16 ENCOUNTER — Telehealth: Payer: Self-pay | Admitting: Psychiatry

## 2018-09-16 DIAGNOSIS — F902 Attention-deficit hyperactivity disorder, combined type: Secondary | ICD-10-CM

## 2018-09-16 MED ORDER — AMPHETAMINE-DEXTROAMPHET ER 20 MG PO CP24: 20.0000 mg | ORAL_CAPSULE | Freq: Every day | ORAL | 0 refills | Status: DC

## 2018-09-18 ENCOUNTER — Ambulatory Visit: Payer: BC Managed Care – PPO | Admitting: Psychiatry

## 2018-09-18 VITALS — BP 151/101 | HR 80

## 2018-09-18 DIAGNOSIS — F902 Attention-deficit hyperactivity disorder, combined type: Secondary | ICD-10-CM

## 2018-09-18 DIAGNOSIS — F330 Major depressive disorder, recurrent, mild: Secondary | ICD-10-CM

## 2018-09-18 DIAGNOSIS — F411 Generalized anxiety disorder: Secondary | ICD-10-CM

## 2018-09-18 MED ORDER — VORTIOXETINE HBR 10 MG PO TABS: 10.0000 mg | ORAL_TABLET | Freq: Every day | ORAL | 2 refills | Status: AC

## 2018-09-18 MED ORDER — LISDEXAMFETAMINE DIMESYLATE 50 MG PO CAPS: 50.0000 mg | ORAL_CAPSULE | Freq: Every day | ORAL | 0 refills | Status: DC

## 2018-09-18 MED ORDER — AMPHETAMINE-DEXTROAMPHET ER 20 MG PO CP24: 20.0000 mg | ORAL_CAPSULE | Freq: Every day | ORAL | 0 refills | Status: DC

## 2018-09-18 NOTE — Telephone Encounter (Signed)
Refill sent for adderall XR

## 2018-09-18 NOTE — Progress Notes (Signed)
Gina Washington [id number] December 13, 1965 52 y.o.  Subjective:   Patient ID:  Gina Washington is a 52 y.o. (DOB 1965/12/16) female.  Chief Complaint:  Chief Complaint  Patient presents with  . Follow-up    Attention deficit, depression, anxiety    HPI Gina Washington presents to the office today for follow-up of depression, anxiety, and ADD. Reports that husband and stresses at home have improved. She reports "I feel pretty good." She reports that she initially had severe nausea with Trintellix and then switched to bedtime. Reports that she had a panic attack without apparent trigger on Saturday or Sunday. Reports that functional movement d/o s/s "have sort of leveled out." Reports that her anxiety and depression are "minimal" and is unsure if it is related to Trintellix or situations. Denies current depressed mood. "I'm still not super motivated" to do certain things, such as her hair and make-up. "I take the easy way out any way I can."  Reports that her energy has been good. She reports that she is sleeping well. Reports that Adderall XR does not work as well as Vyvanse and feels scattered and "bounces from one thing to the other." Denies SI.    Review of Systems:  Review of Systems  Gastrointestinal: Negative for nausea.  Musculoskeletal: Negative for gait problem.  Neurological: Positive for tremors.       No worsening in tremor  Psychiatric/Behavioral:       Please refer to HPI    Medications: I have reviewed the patient's current medications.  Current Outpatient Medications  Medication Sig Dispense Refill  . amLODipine (NORVASC) 5 MG tablet Take 5 mg by mouth daily.    [name] Kitchen amphetamine-dextroamphetamine (ADDERALL XR) 20 MG 24 hr capsule Take 1 capsule (20 mg total) by mouth daily. 30 capsule 0  . Cyanocobalamin (VITAMIN B-12 IJ) Inject as directed.    . metoprolol tartrate (LOPRESSOR) 100 MG tablet Take 100 mg by mouth daily.     . valsartan (DIOVAN) 160 MG tablet Take 160 mg by mouth daily.     . Vitamin D, Ergocalciferol, (DRISDOL) 50000 units CAPS capsule Take 50,000 Units by mouth every 7 (seven) days.    [name] Kitchen vortioxetine HBr (TRINTELLIX) 10 MG TABS tablet Take 1 tablet (10 mg total) by mouth daily. 30 tablet 2  . [START ON 10/14/2018] amphetamine-dextroamphetamine (ADDERALL XR) 20 MG 24 hr capsule Take 1 capsule (20 mg total) by mouth daily. 30 capsule 0  . [START ON 10/14/2018] lisdexamfetamine (VYVANSE) 50 MG capsule Take 1 capsule (50 mg total) by mouth daily. 30 capsule 0   No current facility-administered medications for this visit.     Medication Side Effects: Other: Initially had some nausea. Reports that sexual side effects seem to be less.  Allergies:  Allergies  Allergen Reactions  . Bactrim [Sulfamethoxazole-Trimethoprim] Hives  . Codeine Nausea And Vomiting    Past Medical History:  Diagnosis Date  . Hypertension   . Migraine     No family history on file.  Social History   Socioeconomic History  . Marital status: Unknown    Spouse name: Not on file  . Number of children: Not on file  . Years of education: Not on file  . Highest education level: Not on file  Occupational History  . Not on file  Social Needs  . Financial resource strain: Not on file  . Food insecurity:    Worry: Not on file    Inability: Not on file  . Transportation needs:  Medical: Not on file    Non-medical: Not on file  Tobacco Use  . Smoking status: Former Research scientist (life sciences)  . Smokeless tobacco: Never Used  Substance and Sexual Activity  . Alcohol use: Not on file  . Drug use: Not on file  . Sexual activity: Not on file  Lifestyle  . Physical activity:    Days per week: Not on file    Minutes per session: Not on file  . Stress: Not on file  Relationships  . Social connections:    Talks on phone: Not on file    Gets together: Not on file    Attends religious service: Not on file    Active member of club or organization: Not on file    Attends meetings of clubs or  organizations: Not on file    Relationship status: Not on file  . Intimate partner violence:    Fear of current or ex partner: Not on file    Emotionally abused: Not on file    Physically abused: Not on file    Forced sexual activity: Not on file  Other Topics Concern  . Not on file  Social History Narrative  . Not on file    Past Medical History, Surgical history, Social history, and Family history were reviewed and updated as appropriate.   Please see review of systems for further details on the patient's review from today.   Objective:   Physical Exam:  BP (!) 151/101   Pulse 80 Comment: Reports having stressful mtg at work prior to apt  Physical Exam  Constitutional: She is oriented to person, place, and time. She appears well-developed. No distress.  Musculoskeletal: She exhibits no deformity.  Neurological: She is alert and oriented to person, place, and time. Coordination normal.  Psychiatric: She has a normal mood and affect. Her speech is normal and behavior is normal. Judgment and thought content normal. Her mood appears not anxious. Her affect is not angry, not blunt, not labile and not inappropriate. Cognition and memory are normal. She does not exhibit a depressed mood. She expresses no homicidal and no suicidal ideation. She expresses no suicidal plans and no homicidal plans.  Insight intact. No auditory or visual hallucinations. No delusions.  Minimal grooming.    Lab Review:  No results found for: NA, K, CL, CO2, GLUCOSE, BUN, CREATININE, CALCIUM, PROT, ALBUMIN, AST, ALT, ALKPHOS, BILITOT, GFRNONAA, GFRAA  No results found for: WBC, RBC, HGB, HCT, PLT, MCV, MCH, MCHC, RDW, LYMPHSABS, MONOABS, EOSABS, BASOSABS  No results found for: POCLITH, LITHIUM   No results found for: PHENYTOIN, PHENOBARB, VALPROATE, CBMZ   .res Assessment: Plan:   Patient seen for 30 minutes and greater than 50% of visit spent counseling patient regarding possible treatment options.   Will send prescription for Vyvanse 50 mg daily to be filled after January 1 to determine if Vyvanse may be more affordable with next benefit year since patient reports that Vyvanse was more effective than Adderall XR.  Will also send prescription for Adderall XR with instructions to pharmacy to only fill if Vyvanse is cost prohibitive.  Discussed that increasing Trintellix to 20 mg daily may improve anxiety and motivation, however it could potentially worsen possible sexual side effects.  Patient reports that she would like to continue Trintellix 10 mg at this time to continue to monitor response and may consider increase in Trintellix in the future. Attention deficit hyperactivity disorder, combined type - Plan: lisdexamfetamine (VYVANSE) 50 MG capsule, amphetamine-dextroamphetamine (ADDERALL XR) 20  MG 24 hr capsule  Generalized anxiety disorder  Mild episode of recurrent major depressive disorder ([place]) - Plan: vortioxetine HBr (TRINTELLIX) 10 MG TABS tablet  Please see After Visit Summary for patient specific instructions.  Future Appointments  Date Time Provider [place]  12/22/2018  4:15 PM Thayer Headings, PMHNP [facility] None    No orders of the defined types were placed in this encounter.     -------------------------------

## 2018-11-19 DIAGNOSIS — G259 Extrapyramidal and movement disorder, unspecified: Secondary | ICD-10-CM | POA: Insufficient documentation

## 2018-12-07 ENCOUNTER — Other Ambulatory Visit: Payer: Self-pay | Admitting: Psychiatry

## 2018-12-07 DIAGNOSIS — F902 Attention-deficit hyperactivity disorder, combined type: Secondary | ICD-10-CM

## 2018-12-08 NOTE — Telephone Encounter (Signed)
Last fill 10/15/2018

## 2018-12-22 ENCOUNTER — Encounter: Payer: Self-pay | Admitting: Psychiatry

## 2018-12-22 ENCOUNTER — Ambulatory Visit: Payer: BC Managed Care – PPO | Admitting: Psychiatry

## 2018-12-22 VITALS — BP 154/97 | HR 103

## 2018-12-22 DIAGNOSIS — F330 Major depressive disorder, recurrent, mild: Secondary | ICD-10-CM

## 2018-12-22 DIAGNOSIS — F902 Attention-deficit hyperactivity disorder, combined type: Secondary | ICD-10-CM | POA: Diagnosis not present

## 2018-12-22 DIAGNOSIS — F411 Generalized anxiety disorder: Secondary | ICD-10-CM | POA: Diagnosis not present

## 2018-12-22 MED ORDER — AMPHETAMINE-DEXTROAMPHET ER 20 MG PO CP24: 20.0000 mg | ORAL_CAPSULE | Freq: Every day | ORAL | 0 refills | Status: DC

## 2018-12-22 NOTE — Progress Notes (Signed)
Gina Washington [id number] 1966-05-04 53 y.o.  Subjective:   Patient ID:  Gina Washington is a 53 y.o. (DOB 08/26/1966) female.  Chief Complaint:  Chief Complaint  Patient presents with  . ADD    HPI Gina Washington presents to the office today for follow-up of anxiety, depression, and ADD. She reports situational stress with work and feels that she is managing this adequately. Reports working 3 hours overtime daily and at least 6 hours over the weekend. She reports that situation at home has significantly improved. She reports worsening in functional movement d/o with feeling foggy and in response to increased stress. Denies any recent panic attacks. Denies depressed mood. She reports infrequent irritability in response to stressors. She reports that concentration requires considerable effort and attributes this to multiple responsibilities. She reports that her energy and motivation have improved. Sleeping well. She reports decreased appetite and is not sure why. Denies weight loss. Denies SI.   She has continued to see movement disorders therapist.   Review of Systems:  Review of Systems  Cardiovascular: Negative for palpitations.  Musculoskeletal: Negative for gait problem.  Neurological: Positive for tremors.       Chronic tremor in right hand and right foot.   Psychiatric/Behavioral:       Please refer to HPI    Medications: I have reviewed the patient's current medications.  Current Outpatient Medications  Medication Sig Dispense Refill  . amLODipine (NORVASC) 5 MG tablet Take 5 mg by mouth daily.    Derrill Memo ON 01/04/2019] amphetamine-dextroamphetamine (ADDERALL XR) 20 MG 24 hr capsule Take 1 capsule (20 mg total) by mouth daily for 30 days. 30 capsule 0  . [START ON 02/01/2019] amphetamine-dextroamphetamine (ADDERALL XR) 20 MG 24 hr capsule Take 1 capsule (20 mg total) by mouth daily for 30 days. 30 capsule 0  . metoprolol tartrate (LOPRESSOR) 100 MG tablet Take 100 mg by mouth daily.      . valsartan (DIOVAN) 160 MG tablet Take 160 mg by mouth daily.    Derrill Memo ON 03/01/2019] amphetamine-dextroamphetamine (ADDERALL XR) 20 MG 24 hr capsule Take 1 capsule (20 mg total) by mouth daily for 30 days. 30 capsule 0   No current facility-administered medications for this visit.     Medication Side Effects: None  Allergies:  Allergies  Allergen Reactions  . Bactrim [Sulfamethoxazole-Trimethoprim] Hives  . Codeine Nausea And Vomiting    Past Medical History:  Diagnosis Date  . Hypertension   . Migraine     History reviewed. No pertinent family history.  Social History   Socioeconomic History  . Marital status: Unknown    Spouse name: Not on file  . Number of children: Not on file  . Years of education: Not on file  . Highest education level: Not on file  Occupational History  . Not on file  Social Needs  . Financial resource strain: Not on file  . Food insecurity:    Worry: Not on file    Inability: Not on file  . Transportation needs:    Medical: Not on file    Non-medical: Not on file  Tobacco Use  . Smoking status: Former Research scientist (life sciences)  . Smokeless tobacco: Never Used  Substance and Sexual Activity  . Alcohol use: Not on file  . Drug use: Not on file  . Sexual activity: Not on file  Lifestyle  . Physical activity:    Days per week: Not on file    Minutes per session: Not on file  .  Stress: Not on file  Relationships  . Social connections:    Talks on phone: Not on file    Gets together: Not on file    Attends religious service: Not on file    Active member of club or organization: Not on file    Attends meetings of clubs or organizations: Not on file    Relationship status: Not on file  . Intimate partner violence:    Fear of current or ex partner: Not on file    Emotionally abused: Not on file    Physically abused: Not on file    Forced sexual activity: Not on file  Other Topics Concern  . Not on file  Social History Narrative  . Not on file     Past Medical History, Surgical history, Social history, and Family history were reviewed and updated as appropriate.   Please see review of systems for further details on the patient's review from today.   Objective:   Physical Exam:  BP (!) 154/97   Pulse (!) 103   Physical Exam Constitutional:      General: She is not in acute distress.    Appearance: She is well-developed.  Musculoskeletal:        General: No deformity.  Neurological:     Mental Status: She is alert and oriented to person, place, and time.     Coordination: Coordination normal.  Psychiatric:        Attention and Perception: Attention and perception normal. She does not perceive auditory or visual hallucinations.        Mood and Affect: Mood normal. Mood is not anxious or depressed. Affect is not labile, blunt, angry or inappropriate.        Speech: Speech normal.        Behavior: Behavior normal.        Thought Content: Thought content normal. Thought content does not include homicidal or suicidal ideation. Thought content does not include homicidal or suicidal plan.        Cognition and Memory: Cognition and memory normal.        Judgment: Judgment normal.     Comments: Insight intact. No delusions.      Lab Review:  No results found for: NA, K, CL, CO2, GLUCOSE, BUN, CREATININE, CALCIUM, PROT, ALBUMIN, AST, ALT, ALKPHOS, BILITOT, GFRNONAA, GFRAA  No results found for: WBC, RBC, HGB, HCT, PLT, MCV, MCH, MCHC, RDW, LYMPHSABS, MONOABS, EOSABS, BASOSABS  No results found for: POCLITH, LITHIUM   No results found for: PHENYTOIN, PHENOBARB, VALPROATE, CBMZ   .res Assessment: Plan:   Will continue Adderall XR 20 mg po q am for ADD. Pt reports that she may wish to consider switching back to Vyvanse at next visit since she would prefer not to make a change until out of work over the summer. Patient advised to contact office with any questions, adverse effects, or acute worsening in signs and  symptoms.  Attention deficit hyperactivity disorder, combined type - Plan: amphetamine-dextroamphetamine (ADDERALL XR) 20 MG 24 hr capsule, amphetamine-dextroamphetamine (ADDERALL XR) 20 MG 24 hr capsule, amphetamine-dextroamphetamine (ADDERALL XR) 20 MG 24 hr capsule  Mild episode of recurrent major depressive disorder ([facility])  Generalized anxiety disorder  Please see After Visit Summary for patient specific instructions.  Future Appointments  Date Time Provider [place]  03/26/2019  1:00 PM Thayer Headings, PMHNP [facility] None    No orders of the defined types were placed in this encounter.     -------------------------------

## 2019-03-26 ENCOUNTER — Ambulatory Visit: Payer: BC Managed Care – PPO | Admitting: Psychiatry

## 2019-04-27 ENCOUNTER — Encounter (INDEPENDENT_AMBULATORY_CARE_PROVIDER_SITE_OTHER): Payer: Self-pay

## 2019-04-27 ENCOUNTER — Other Ambulatory Visit: Payer: Self-pay

## 2019-04-27 ENCOUNTER — Ambulatory Visit: Payer: BC Managed Care – PPO | Admitting: Psychiatry

## 2019-04-27 ENCOUNTER — Encounter: Payer: Self-pay | Admitting: Psychiatry

## 2019-04-27 DIAGNOSIS — F902 Attention-deficit hyperactivity disorder, combined type: Secondary | ICD-10-CM

## 2019-04-27 MED ORDER — AMPHETAMINE-DEXTROAMPHET ER 20 MG PO CP24: 20.0000 mg | ORAL_CAPSULE | Freq: Every day | ORAL | 0 refills | Status: DC

## 2019-04-27 NOTE — Progress Notes (Signed)
Gina Washington [id number] 1966/10/07 52 y.o.  Subjective:   Patient ID:  Gina Washington is a 53 y.o. (DOB [birth year]) female.  Chief Complaint:  Chief Complaint  Patient presents with  . ADD    HPI Gina Washington presents to the office today for follow-up of anxiety and ADD. She reports some anxiety with uncertainty about upcoming school year. She reports that she has some worry about the school year and what will be expected of her. She reports that she was somewhat jittery on the ride to apt today. She reports that she notices some worsening in functional movement d/o s/s when anxiety is worse. Denies any recent panic attacks. She reports that she occ feels the start of panic s/s and will do what she can to decrease anxiety when this occurs. She reports that her mood has been "pretty good." Denies depressed mood. She reports that she has continued to take Adderall over the summer since she noticed that when she did not take it she felt "flighty" and had difficulty with losing things, was not as productive, and not staying on task. She reports that her sleep is fair and thinks that older mattress is causing some sleep disturbance. She reports that her appetite has been normal and started Weight [employer] recently. She reports that her energy and motivation have been ok. She reports that she has been gardening and doing yard work. Denies SI.   Reports that she and her husband have had several unexpected expenses. She reports that her husband has continued to do well in terms of mood and behavior.    Review of Systems:  Review of Systems  Cardiovascular: Negative for palpitations.  Musculoskeletal: Negative for gait problem.  Neurological: Positive for tremors.       Recently saw a neurologist for f/u re: functional movement d/o. Notices foot drag, decreased strength, and decreased coordination in right hand.   Psychiatric/Behavioral:       Please refer to HPI    Medications: I have reviewed the  patient's current medications.  Current Outpatient Medications  Medication Sig Dispense Refill  . amLODipine (NORVASC) 5 MG tablet Take 5 mg by mouth daily.    . metoprolol tartrate (LOPRESSOR) 100 MG tablet Take 100 mg by mouth daily.     . valsartan (DIOVAN) 160 MG tablet Take 160 mg by mouth daily.    [name] Kitchen amphetamine-dextroamphetamine (ADDERALL XR) 20 MG 24 hr capsule Take 1 capsule (20 mg total) by mouth daily for 30 days. 30 capsule 0  . amphetamine-dextroamphetamine (ADDERALL XR) 20 MG 24 hr capsule Take 1 capsule (20 mg total) by mouth daily for 30 days. 30 capsule 0  . amphetamine-dextroamphetamine (ADDERALL XR) 20 MG 24 hr capsule Take 1 capsule (20 mg total) by mouth daily. 30 capsule 0   No current facility-administered medications for this visit.     Medication Side Effects: None  Allergies:  Allergies  Allergen Reactions  . Bactrim [Sulfamethoxazole-Trimethoprim] Hives  . Codeine Nausea And Vomiting    Past Medical History:  Diagnosis Date  . Hypertension   . Migraine     History reviewed. No pertinent family history.  Social History   Socioeconomic History  . Marital status: Unknown    Spouse name: Not on file  . Number of children: Not on file  . Years of education: Not on file  . Highest education level: Not on file  Occupational History  . Not on file  Social Needs  . Financial resource strain: Not on  file  . Food insecurity    Worry: Not on file    Inability: Not on file  . Transportation needs    Medical: Not on file    Non-medical: Not on file  Tobacco Use  . Smoking status: Former Research scientist (life sciences)  . Smokeless tobacco: Never Used  Substance and Sexual Activity  . Alcohol use: Not on file  . Drug use: Not on file  . Sexual activity: Not on file  Lifestyle  . Physical activity    Days per week: Not on file    Minutes per session: Not on file  . Stress: Not on file  Relationships  . Social Herbalist on phone: Not on file    Gets  together: Not on file    Attends religious service: Not on file    Active member of club or organization: Not on file    Attends meetings of clubs or organizations: Not on file    Relationship status: Not on file  . Intimate partner violence    Fear of current or ex partner: Not on file    Emotionally abused: Not on file    Physically abused: Not on file    Forced sexual activity: Not on file  Other Topics Concern  . Not on file  Social History Narrative  . Not on file    Past Medical History, Surgical history, Social history, and Family history were reviewed and updated as appropriate.   Please see review of systems for further details on the patient's review from today.   Objective:   Physical Exam:  BP 129/82   Pulse 64   Physical Exam Constitutional:      General: She is not in acute distress.    Appearance: She is well-developed.  Musculoskeletal:        General: No deformity.  Neurological:     Mental Status: She is alert and oriented to person, place, and time.     Coordination: Coordination normal.  Psychiatric:        Attention and Perception: Attention and perception normal. She does not perceive auditory or visual hallucinations.        Mood and Affect: Mood normal. Mood is not anxious or depressed. Affect is not labile, blunt, angry or inappropriate.        Speech: Speech normal.        Behavior: Behavior normal.        Thought Content: Thought content normal. Thought content does not include homicidal or suicidal ideation. Thought content does not include homicidal or suicidal plan.        Cognition and Memory: Cognition and memory normal.        Judgment: Judgment normal.     Comments: Insight intact. No delusions.      Lab Review:  No results found for: NA, K, CL, CO2, GLUCOSE, BUN, CREATININE, CALCIUM, PROT, ALBUMIN, AST, ALT, ALKPHOS, BILITOT, GFRNONAA, GFRAA  No results found for: WBC, RBC, HGB, HCT, PLT, MCV, MCH, MCHC, RDW, LYMPHSABS, MONOABS,  EOSABS, BASOSABS  No results found for: POCLITH, LITHIUM   No results found for: PHENYTOIN, PHENOBARB, VALPROATE, CBMZ   .res Assessment: Plan:   Discussed several options for attention deficit and potential costs since patient reports that Vyvanse seems to be most effective for her but is currently cost prohibitive.  Provided patient with information regarding [facility] help at hand and recommended that she contact them to determine if she is eligible for co-pay assistance. Will order  Adderall XR 20 mg p.o. every morning for patient to use this month while determining if she may be eligible for financial assistance with either Vyvanse or [employer].  Advised patient to contact office in several weeks to discuss if she would like to continue Adderall XR or if she has been able to find assistance with Vyvanse or [employer]zen[employer]. Patient to follow-up in 3 months or sooner if clinically indicated. Patient advised to contact office with any questions, adverse effects, or acute worsening in signs and symptoms.  Gina Washington was seen today for add.  Diagnoses and all orders for this visit:  Attention deficit hyperactivity disorder, combined type -     amphetamine-dextroamphetamine (ADDERALL XR) 20 MG 24 hr capsule; Take 1 capsule (20 mg total) by mouth daily.     Please see After Visit Summary for patient specific instructions.  Future Appointments  Date Time Provider [place]  07/29/2019  4:00 PM Thayer Headings, PMHNP [facility] None    No orders of the defined types were placed in this encounter.   -------------------------------

## 2019-06-17 ENCOUNTER — Telehealth: Payer: Self-pay | Admitting: Psychiatry

## 2019-06-17 DIAGNOSIS — F902 Attention-deficit hyperactivity disorder, combined type: Secondary | ICD-10-CM

## 2019-06-17 NOTE — Telephone Encounter (Signed)
Pt called stating she is fine continuing with the Adderall.

## 2019-06-18 MED ORDER — AMPHETAMINE-DEXTROAMPHET ER 20 MG PO CP24: 20.0000 mg | ORAL_CAPSULE | Freq: Every day | ORAL | 0 refills | Status: DC

## 2019-06-18 NOTE — Telephone Encounter (Signed)
Left pt. A detailed VM and asked her to call back with any questions.

## 2019-07-29 ENCOUNTER — Ambulatory Visit: Payer: BC Managed Care – PPO | Admitting: Psychiatry

## 2019-08-02 ENCOUNTER — Other Ambulatory Visit: Payer: Self-pay

## 2019-08-02 ENCOUNTER — Encounter: Payer: Self-pay | Admitting: Psychiatry

## 2019-08-02 ENCOUNTER — Ambulatory Visit (INDEPENDENT_AMBULATORY_CARE_PROVIDER_SITE_OTHER): Payer: BC Managed Care – PPO | Admitting: Psychiatry

## 2019-08-02 VITALS — BP 142/95 | HR 89 | Wt 182.0 lb

## 2019-08-02 DIAGNOSIS — F330 Major depressive disorder, recurrent, mild: Secondary | ICD-10-CM

## 2019-08-02 DIAGNOSIS — F902 Attention-deficit hyperactivity disorder, combined type: Secondary | ICD-10-CM | POA: Diagnosis not present

## 2019-08-02 MED ORDER — AMPHETAMINE-DEXTROAMPHET ER 20 MG PO CP24: 20.0000 mg | ORAL_CAPSULE | Freq: Every day | ORAL | 0 refills | Status: DC

## 2019-08-02 MED ORDER — BUPROPION HCL ER (XL) 150 MG PO TB24: 150.0000 mg | ORAL_TABLET | Freq: Every day | ORAL | 3 refills | Status: DC

## 2019-08-02 NOTE — Progress Notes (Signed)
[name] [birth year] 53 y.o.  Subjective:   Patient ID:  Gina Washington is a 52 y.o. (DOB 03/03/1966) female.  Chief Complaint:  Chief Complaint  Patient presents with  . Depression  . Follow-up    ADD    HPI [name] presents to the office today for follow-up of ADD and anxiety. 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 She reports that she has had some worry but not excessively. Denies any recent panic attacks. Notices some depressed mood. She has been more tearful recently. Describes being frustrated by things more easily. She reports low energy and low motivation. She reports poor concentration and having difficulty completing one task before going to another task. Some increased challenges with teaching online. She reports that she has been having middle of the night awakenings almost every night around 2-2:15 and then not able to return to sleep until 5-5:30 am. She reports that she is now able to return to sleep quickly and awakenings are no longer occurring every night. Denies any change in appetite. Denies SI.   She reports that she has been having difficulty with functional movement d/o s/s.   She reports that husband has not been able to drive independently and she continues to perform most of the household responsibilities- errands, finances, cleaning, etc.   Review of Systems:  Review of Systems  Gastrointestinal: Negative.   Musculoskeletal: Positive for back pain. Negative for gait problem.  Skin:       Skin laceration on right forearm that she says is from her countertop.   Neurological: Negative for tremors and headaches.       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 Reports bumping into objects.   Psychiatric/Behavioral:        Please refer to HPI    Medications: I have reviewed the patient's current medications.  Current Outpatient Medications  Medication Sig Dispense Refill  . amLODipine (NORVASC) 5 MG tablet Take 5 mg by mouth daily.    Derrill Memo ON 10/18/2019] amphetamine-dextroamphetamine (ADDERALL XR) 20 MG 24 hr capsule Take 1 capsule (20 mg total) by mouth daily. 30 capsule 0  . metoprolol tartrate (LOPRESSOR) 100 MG tablet Take 100 mg by mouth daily.     . valsartan (DIOVAN) 160 MG tablet Take 160 mg by mouth daily.    Derrill Memo ON 09/20/2019] amphetamine-dextroamphetamine (ADDERALL XR) 20 MG 24 hr capsule Take 1 capsule (20 mg total) by mouth daily. 30 capsule 0  . [START ON 08/23/2019] amphetamine-dextroamphetamine (ADDERALL XR) 20 MG 24 hr capsule Take 1 capsule (20 mg total) by mouth daily. 30 capsule 0  . buPROPion (WELLBUTRIN XL) 150 MG 24 hr tablet Take 1 tablet (150 mg total) by mouth daily. 30 tablet 3   No current facility-administered medications for this visit.     Medication Side Effects: None  Allergies:  Allergies  Allergen Reactions  . Bactrim [Sulfamethoxazole-Trimethoprim] Hives  . Codeine Nausea And Vomiting    Past Medical History:  Diagnosis Date  . Hypertension   . Migraine     History reviewed. No pertinent family history.  Social History   Socioeconomic History  . Marital status: Unknown    Spouse name: Not on file  . Number of children: Not on file  . Years of education:  Not on file  . Highest education level: Not on file  Occupational History  . Not on file  Social Needs  . Financial resource strain: Not on file  . Food insecurity    Worry: Not on file    Inability: Not on file  . Transportation needs    Medical: Not on file    Non-medical: Not on file  Tobacco Use  . Smoking status: Former Research scientist (life sciences)  . Smokeless tobacco: Never Used  Substance and Sexual Activity  . Alcohol use: Not on file  . Drug use: Not on file  . Sexual activity: Not on file   Lifestyle  . Physical activity    Days per week: Not on file    Minutes per session: Not on file  . Stress: Not on file  Relationships  . Social Herbalist on phone: Not on file    Gets together: Not on file    Attends religious service: Not on file    Active member of club or organization: Not on file    Attends meetings of clubs or organizations: Not on file    Relationship status: Not on file  . Intimate partner violence    Fear of current or ex partner: Not on file    Emotionally abused: Not on file    Physically abused: Not on file    Forced sexual activity: Not on file  Other Topics Concern  . Not on file  Social History Narrative  . Not on file    Past Medical History, Surgical history, Social history, and Family history were reviewed and updated as appropriate.   Please see review of systems for further details on the patient's review from today.   Objective:   Physical Exam:  BP (!) 142/95   Pulse 89   Wt 182 lb (82.6 kg)   Physical Exam Constitutional:      General: She is not in acute distress.    Appearance: She is well-developed.  Musculoskeletal:        General: No deformity.  Neurological:     Mental Status: She is alert and oriented to person, place, and time.     Coordination: Coordination normal.  Psychiatric:        Attention and Perception: Attention and perception normal. She does not perceive auditory or visual hallucinations.        Mood and Affect: Mood is depressed. Mood is not anxious. Affect is not labile, blunt, angry or inappropriate.        Speech: Speech normal.        Behavior: Behavior normal.        Thought Content: Thought content is not paranoid or delusional. Thought content does not include homicidal or suicidal ideation. Thought content does not include homicidal or suicidal plan.        Cognition and Memory: Cognition and memory normal.        Judgment: Judgment normal.     Comments: Insight intact.      Lab  Review:  No results found for: NA, K, CL, CO2, GLUCOSE, BUN, CREATININE, CALCIUM, PROT, ALBUMIN, AST, ALT, ALKPHOS, BILITOT, GFRNONAA, GFRAA  No results found for: WBC, RBC, HGB, HCT, PLT, MCV, MCH, MCHC, RDW, LYMPHSABS, MONOABS, EOSABS, BASOSABS  No results found for: POCLITH, LITHIUM   No results found for: PHENYTOIN, PHENOBARB, VALPROATE, CBMZ   .res Assessment: Plan:   Patient asks about resuming Wellbutrin XL since she has taken this in the past and  reports that it seems to have been well-tolerated for depression.  Reviewed potential benefits, risks, and side effects of Wellbutrin XL.  Will start Wellbutrin XL 150 mg in the morning for treatment of depression and low energy and motivation. Will continue Adderall XR 20 mg daily for attention deficit.  Patient reports that other stimulants are significantly more expensive for her with limited additional benefit and therefore wishes to continue Adderall XR 20 mg daily. Patient to follow-up in 3 months or sooner if clinically indicated. Patient advised to contact office with any questions, adverse effects, or acute worsening in signs and symptoms. Recommend continuing to see therapist for treatment of functional movement disorder.  Kylea was seen today for depression and follow-up.  Diagnoses and all orders for this visit:  Mild episode of recurrent major depressive disorder (HCC) -     buPROPion (WELLBUTRIN XL) 150 MG 24 hr tablet; Take 1 tablet (150 mg total) by mouth daily.  Attention deficit hyperactivity disorder, combined type -     amphetamine-dextroamphetamine (ADDERALL XR) 20 MG 24 hr capsule; Take 1 capsule (20 mg total) by mouth daily. -     amphetamine-dextroamphetamine (ADDERALL XR) 20 MG 24 hr capsule; Take 1 capsule (20 mg total) by mouth daily. -     amphetamine-dextroamphetamine (ADDERALL XR) 20 MG 24 hr capsule; Take 1 capsule (20 mg total) by mouth daily.     Please see After Visit Summary for patient specific  instructions.  No future appointments.  No orders of the defined types were placed in this encounter.   -------------------------------

## 2019-11-04 ENCOUNTER — Ambulatory Visit (INDEPENDENT_AMBULATORY_CARE_PROVIDER_SITE_OTHER): Payer: BC Managed Care – PPO | Admitting: Psychiatry

## 2019-11-04 DIAGNOSIS — F330 Major depressive disorder, recurrent, mild: Secondary | ICD-10-CM | POA: Diagnosis not present

## 2019-11-04 DIAGNOSIS — F902 Attention-deficit hyperactivity disorder, combined type: Secondary | ICD-10-CM | POA: Diagnosis not present

## 2019-11-04 MED ORDER — AMPHETAMINE-DEXTROAMPHET ER 20 MG PO CP24: 20.0000 mg | ORAL_CAPSULE | Freq: Every day | ORAL | 0 refills | Status: DC

## 2019-11-04 MED ORDER — BUPROPION HCL ER (XL) 150 MG PO TB24: 150.0000 mg | ORAL_TABLET | Freq: Every day | ORAL | 0 refills | Status: DC

## 2019-11-04 NOTE — Progress Notes (Signed)
Gina Washington [id number] October 06, 1966 54 y.o.  Virtual Visit via Telephone Note  I connected with pt on 11/04/19 at  3:30 PM EST by telephone and verified that I am speaking with the correct person using two identifiers.   I discussed the limitations, risks, security and privacy concerns of performing an evaluation and management service by telephone and the availability of in person appointments. I also discussed with the patient that there may be a patient responsible charge related to this service. The patient expressed understanding and agreed to proceed.   I discussed the assessment and treatment plan with the patient. The patient was provided an opportunity to ask questions and all were answered. The patient agreed with the plan and demonstrated an understanding of the instructions.   The patient was advised to call back or seek an in-person evaluation if the symptoms worsen or if the condition fails to improve as anticipated.  I provided 30 minutes of non-face-to-face time during this encounter.  The patient was located at home.  The provider was located at [place].   Thayer Headings, PMHNP   Subjective:   Patient ID:  Gina Washington is a 54 y.o. (DOB [birth year]) female.  Chief Complaint:  Chief Complaint  Patient presents with  . Follow-up    Depression, Anxiety, and ADD    HPI Gina Washington presents for follow-up of anxiety, depression, and ADD. She reports that she has some nights of disrupted sleep with multiple awakenings a night. She reports that sometimes she will awaken around 2 am and it takes several hours to fall asleep. She reports that she is usually thinking about her work in the middle of the night. She reports that she does not feel nervous. Denies any panic s/s and has not felt on the verge of a panic s/s. She reports that she continues to have functional movement d/o s/s to include difficulty writing and her foot tends to move, particularly at school. She  reports that she feels like she can never get caught up and accomplish everything she needs to. She reports that some of this is related to increased demands at work. She reports that her mood has been "pretty even." Denies irritability. She reports that she has some difficulty prioritizing tasks. She reports that she feels less anxious and less depressed on Wellbutrin XL. She reports that she not been as tearful. She reports that her energy and motivation remains low. Appetite has been normal. She is unsure if concentration has changed. Has contemplated starting reading again. Denies SI.   Has not seen therapist since November.    Review of Systems:  Review of Systems  Cardiovascular: Negative for palpitations.  Musculoskeletal: Negative for gait problem.  Neurological: Negative for tremors and headaches.  Psychiatric/Behavioral:       Please refer to HPI    Medications: I have reviewed the patient's current medications.  Current Outpatient Medications  Medication Sig Dispense Refill  . amLODipine (NORVASC) 5 MG tablet Take 5 mg by mouth daily.    Derrill Memo ON 01/17/2020] amphetamine-dextroamphetamine (ADDERALL XR) 20 MG 24 hr capsule Take 1 capsule (20 mg total) by mouth daily. 30 capsule 0  . B Complex Vitamins (B COMPLEX 1 PO) Take by mouth.    [name] Kitchen BIOTIN PO Take by mouth.    [name] Kitchen buPROPion (WELLBUTRIN XL) 150 MG 24 hr tablet Take 1 tablet (150 mg total) by mouth daily. 90 tablet 0  . metoprolol tartrate (LOPRESSOR) 100 MG tablet Take 100 mg by  mouth daily.     . valsartan (DIOVAN) 160 MG tablet Take 160 mg by mouth daily.    Derrill Memo ON 12/20/2019] amphetamine-dextroamphetamine (ADDERALL XR) 20 MG 24 hr capsule Take 1 capsule (20 mg total) by mouth daily. 30 capsule 0  . [START ON 11/22/2019] amphetamine-dextroamphetamine (ADDERALL XR) 20 MG 24 hr capsule Take 1 capsule (20 mg total) by mouth daily. 30 capsule 0   No current facility-administered medications for this visit.    Medication Side  Effects: None  Allergies:  Allergies  Allergen Reactions  . Bactrim [Sulfamethoxazole-Trimethoprim] Hives  . Codeine Nausea And Vomiting    Past Medical History:  Diagnosis Date  . Hypertension   . Migraine     No family history on file.  Social History   Socioeconomic History  . Marital status: Unknown    Spouse name: Not on file  . Number of children: Not on file  . Years of education: Not on file  . Highest education level: Not on file  Occupational History  . Not on file  Tobacco Use  . Smoking status: Former Research scientist (life sciences)  . Smokeless tobacco: Never Used  Substance and Sexual Activity  . Alcohol use: Not on file  . Drug use: Not on file  . Sexual activity: Not on file  Other Topics Concern  . Not on file  Social History Narrative  . Not on file   Social Determinants of Health   Financial Resource Strain:   . Difficulty of Paying Living Expenses: Not on file  Food Insecurity:   . Worried About Charity fundraiser in the Last Year: Not on file  . Ran Out of Food in the Last Year: Not on file  Transportation Needs:   . Lack of Transportation (Medical): Not on file  . Lack of Transportation (Non-Medical): Not on file  Physical Activity:   . Days of Exercise per Week: Not on file  . Minutes of Exercise per Session: Not on file  Stress:   . Feeling of Stress : Not on file  Social Connections:   . Frequency of Communication with Friends and Family: Not on file  . Frequency of Social Gatherings with Friends and Family: Not on file  . Attends Religious Services: Not on file  . Active Member of Clubs or Organizations: Not on file  . Attends Archivist Meetings: Not on file  . Marital Status: Not on file  Intimate Partner Violence:   . Fear of Current or Ex-Partner: Not on file  . Emotionally Abused: Not on file  . Physically Abused: Not on file  . Sexually Abused: Not on file    Past Medical History, Surgical history, Social history, and Family  history were reviewed and updated as appropriate.   Please see review of systems for further details on the patient's review from today.   Objective:   Physical Exam:  BP (!) 148/93   Pulse 77   Physical Exam Neurological:     Mental Status: She is alert and oriented to person, place, and time.     Cranial Nerves: No dysarthria.  Psychiatric:        Attention and Perception: Attention and perception normal.        Mood and Affect: Mood normal.        Speech: Speech normal.        Behavior: Behavior is cooperative.        Thought Content: Thought content normal. Thought content is not paranoid  or delusional. Thought content does not include homicidal or suicidal ideation. Thought content does not include homicidal or suicidal plan.        Cognition and Memory: Cognition and memory normal.        Judgment: Judgment normal.     Comments: Insight intact     Lab Review:  No results found for: NA, K, CL, CO2, GLUCOSE, BUN, CREATININE, CALCIUM, PROT, ALBUMIN, AST, ALT, ALKPHOS, BILITOT, GFRNONAA, GFRAA  No results found for: WBC, RBC, HGB, HCT, PLT, MCV, MCH, MCHC, RDW, LYMPHSABS, MONOABS, EOSABS, BASOSABS  No results found for: POCLITH, LITHIUM   No results found for: PHENYTOIN, PHENOBARB, VALPROATE, CBMZ   .res Assessment: Plan:   Patient seen for 30 minutes and greater than 50% of visit spent counseling patient regarding option to increase Wellbutrin XL to 300 mg daily since she reports a partial response and depression with 150 mg daily.  Patient reports that she would like to continue at the 150 mg dose at this time since she attributes some residual depressive signs and symptoms to situational factors.  Also discussed potential risk of increased blood pressure at higher dose of Wellbutrin and patient reports that she has an upcoming appointment with PCP and plans to address elevated blood pressure at that time.  Patient reports that she may consider increase to 300 mg daily at  follow-up visit in 3 months if she continues to have some residual depressive signs and symptoms and blood pressure is better controlled at that time. Will continue Adderall XR 20 mg daily for attention deficit. Patient to follow-up in 3 months or sooner if clinically indicated. Patient advised to contact office with any questions, adverse effects, or acute worsening in signs and symptoms.  Gina Washington was seen today for follow-up.  Diagnoses and all orders for this visit:  Attention deficit hyperactivity disorder, combined type -     amphetamine-dextroamphetamine (ADDERALL XR) 20 MG 24 hr capsule; Take 1 capsule (20 mg total) by mouth daily. -     amphetamine-dextroamphetamine (ADDERALL XR) 20 MG 24 hr capsule; Take 1 capsule (20 mg total) by mouth daily. -     amphetamine-dextroamphetamine (ADDERALL XR) 20 MG 24 hr capsule; Take 1 capsule (20 mg total) by mouth daily.  Mild episode of recurrent major depressive disorder (HCC) -     buPROPion (WELLBUTRIN XL) 150 MG 24 hr tablet; Take 1 tablet (150 mg total) by mouth daily.    Please see After Visit Summary for patient specific instructions.  Future Appointments  Date Time Provider Maurice  02/04/2020  3:15 PM Thayer Headings, PMHNP CP-CP None    No orders of the defined types were placed in this encounter.     -------------------------------

## 2019-11-06 ENCOUNTER — Encounter: Payer: Self-pay | Admitting: Psychiatry

## 2019-12-03 ENCOUNTER — Telehealth: Payer: Self-pay

## 2019-12-03 NOTE — Telephone Encounter (Signed)
Prior authorization submitted for Amphetamine-Dextroamphetamine ER 20 mg through [facility] [employer] [id number] approved effective 12/03/2019-12/02/2022   Submitted through cover my meds

## 2019-12-23 ENCOUNTER — Ambulatory Visit (INDEPENDENT_AMBULATORY_CARE_PROVIDER_SITE_OTHER): Payer: BC Managed Care – PPO | Admitting: Obstetrics & Gynecology

## 2019-12-23 ENCOUNTER — Encounter: Payer: Self-pay | Admitting: Obstetrics & Gynecology

## 2019-12-23 ENCOUNTER — Other Ambulatory Visit: Payer: Self-pay

## 2019-12-23 VITALS — BP 127/70 | HR 60 | Ht 63.5 in | Wt 185.0 lb

## 2019-12-23 DIAGNOSIS — Z01419 Encounter for gynecological examination (general) (routine) without abnormal findings: Secondary | ICD-10-CM | POA: Diagnosis not present

## 2019-12-23 DIAGNOSIS — Z1239 Encounter for other screening for malignant neoplasm of breast: Secondary | ICD-10-CM | POA: Diagnosis not present

## 2019-12-23 DIAGNOSIS — Z124 Encounter for screening for malignant neoplasm of cervix: Secondary | ICD-10-CM

## 2019-12-23 DIAGNOSIS — Z1151 Encounter for screening for human papillomavirus (HPV): Secondary | ICD-10-CM

## 2019-12-23 NOTE — Progress Notes (Signed)
Subjective:     Gina Washington is a 54 y.o. female here for a routine exam.  [name] Current complaints: Pt has LnIUD that is 5 years mature. She still has spotting monthly.    She is married and sexually active. No problems with dyspareunia or incontinence.     Gynecologic History No LMP recorded. (Menstrual status: IUD). Contraception: IUD Last Pap: 3 years prev. Results were: normal Last mammogram: 3 years prev. Results were: normal  Obstetric History OB History  Gravida Para Term Preterm AB Living  1 1 1  0 0 0  SAB TAB Ectopic Multiple Live Births  0 0 0 0 1    # Outcome Date GA Lbr Len/2nd Weight Sex Delivery Anes PTL Lv  1 Term      CS-Unspec        The following portions of the patient's history were reviewed and updated as appropriate: allergies, current medications, past family history, past medical history, past social history, past surgical history and problem list.  Review of Systems Pertinent items are noted in HPI.    Objective:  BP 127/70   Pulse 60   Ht 5' 3.5" (1.613 m)   Wt 185 lb (83.9 kg)   BMI 32.26 kg/m   General Appearance:    Alert, cooperative, no distress, appears stated age  Head:    Normocephalic, without obvious abnormality, atraumatic  Eyes:    conjunctiva/corneas clear, EOM's intact, both eyes  Ears:    Normal external ear canals, both ears  Nose:   Nares normal, septum midline, mucosa normal, no drainage    or sinus tenderness  Throat:   Lips, mucosa, and tongue normal; teeth and gums normal  Neck:   Supple, symmetrical, trachea midline, no adenopathy;    thyroid:  no enlargement/tenderness/nodules  Back:     Symmetric, no curvature, ROM normal, no CVA tenderness  Lungs:     respirations unlabored  Chest Wall:    No tenderness or deformity   Heart:    Regular rate and rhythm  Breast Exam:    No tenderness, masses, or nipple abnormality  Abdomen:     Soft, non-tender, bowel sounds active all four quadrants,    no masses, no organomegaly   Genitalia:    Normal female without lesion, discharge or tenderness     Extremities:   Extremities normal, atraumatic, no cyanosis or edema  Pulses:   2+ and symmetric all extremities  Skin:   Skin color, texture, turgor normal, no rashes or lesions    Assessment:    Healthy female exam.   LnIUD placed for contraception. Pt educated that the IUD is ok for 6 years. She would like to keep it.    Plan:   F/u PAP with hrHPV Screening mammogram Need records from prev physicians ofc Chart monthly spotting F/u in 1 year or sooner prn  Joannah Gitlin L. Harraway-Smith, M.D., Cherlynn June

## 2019-12-23 NOTE — Progress Notes (Signed)
Patient is new patient who presents for annual exam with removal of IUD- pt reports having IUD for five years. Kathrene Alu RN

## 2019-12-27 LAB — CYTOLOGY - PAP
Comment: NEGATIVE
Diagnosis: UNDETERMINED — AB
High risk HPV: NEGATIVE

## 2019-12-30 ENCOUNTER — Ambulatory Visit (HOSPITAL_BASED_OUTPATIENT_CLINIC_OR_DEPARTMENT_OTHER): Payer: BC Managed Care – PPO

## 2020-01-01 DIAGNOSIS — D485 Neoplasm of uncertain behavior of skin: Secondary | ICD-10-CM | POA: Insufficient documentation

## 2020-01-04 ENCOUNTER — Encounter (HOSPITAL_BASED_OUTPATIENT_CLINIC_OR_DEPARTMENT_OTHER): Payer: Self-pay

## 2020-01-04 ENCOUNTER — Ambulatory Visit (HOSPITAL_BASED_OUTPATIENT_CLINIC_OR_DEPARTMENT_OTHER)
Admission: RE | Admit: 2020-01-04 | Discharge: 2020-01-04 | Disposition: A | Discharge status: Home / Self Care | Payer: BC Managed Care – PPO | Source: Ambulatory Visit | Attending: Obstetrics & Gynecology | Admitting: Obstetrics & Gynecology

## 2020-01-04 ENCOUNTER — Other Ambulatory Visit: Payer: Self-pay

## 2020-01-04 DIAGNOSIS — Z1231 Encounter for screening mammogram for malignant neoplasm of breast: Secondary | ICD-10-CM | POA: Diagnosis not present

## 2020-01-04 DIAGNOSIS — Z01419 Encounter for gynecological examination (general) (routine) without abnormal findings: Secondary | ICD-10-CM

## 2020-02-04 ENCOUNTER — Other Ambulatory Visit: Payer: Self-pay | Admitting: Psychiatry

## 2020-02-04 ENCOUNTER — Encounter: Payer: Self-pay | Admitting: Psychiatry

## 2020-02-04 ENCOUNTER — Ambulatory Visit (INDEPENDENT_AMBULATORY_CARE_PROVIDER_SITE_OTHER): Payer: BC Managed Care – PPO | Admitting: Psychiatry

## 2020-02-04 ENCOUNTER — Other Ambulatory Visit: Payer: Self-pay

## 2020-02-04 DIAGNOSIS — F902 Attention-deficit hyperactivity disorder, combined type: Secondary | ICD-10-CM

## 2020-02-04 DIAGNOSIS — F330 Major depressive disorder, recurrent, mild: Secondary | ICD-10-CM

## 2020-02-04 MED ORDER — LISDEXAMFETAMINE DIMESYLATE 50 MG PO CAPS: 50.0000 mg | ORAL_CAPSULE | Freq: Every day | ORAL | 0 refills | Status: DC

## 2020-02-04 MED ORDER — BUPROPION HCL ER (XL) 150 MG PO TB24: 150.0000 mg | ORAL_TABLET | Freq: Every day | ORAL | 1 refills | Status: DC

## 2020-02-04 NOTE — Progress Notes (Signed)
[name] [id number] 26-May-1966 54 y.o.  Subjective:   Patient ID:  Gina Washington is a 54 y.o. (DOB 30-Apr-1966) female.  Chief Complaint:  Chief Complaint  Patient presents with  . Follow-up    ADD, h/o anxiety, depression    HPI Gina Washington presents to the office today for follow-up of ADD, anxiety, and depression. She reports that her concentration is "fairly good" and occasionally will forget things. She reports that she has to plan when to take her Adderall, depending if she has meetings in the morning or the afternoon. Typically takes it 7-8 am and wears off around 1 pm. Reports that Vyvanse duration was longer. Mood has been good. Denies any recent significant anxiety. 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 She reports that she has not been sleeping as well. Reports that she is awakening around 2:15-2:30 am and feels alert and rested. She reports that she will lay in bed with her eyes closed and it may take an hour to return to sleep. Reports that she feels rested upon awakening. Falls asleep without difficulty. She reports that she has been gaining weight and reports that she has not significantly changed her food intake. Has not had time to exercise. Energy has been pretty good and improved with Wellbutrin XL. Motivation has also been improved. Denies SI.   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 She reports that her husband has been doing well. She plans on taking the summer off. Plans on getting caught up at home, taking a vacation, and resting. Daughter just finished freshman year at [employer].   Reports that she has not seen therapist since December for functional movement d/o.   Review of Systems:  Review of Systems  Musculoskeletal: Negative for gait problem.  Skin:       Had skin cancer removed.   Neurological: Positive for weakness. Negative for tremors.  Psychiatric/Behavioral:       Please refer to HPI    Medications: I have reviewed the patient's current medications.  Current Outpatient Medications  Medication Sig Dispense Refill  . amLODipine (NORVASC) 5 MG tablet Take 5 mg by mouth daily.    [name] Kitchen amphetamine-dextroamphetamine (ADDERALL XR) 20 MG 24 hr capsule Take 1 capsule (20 mg total) by mouth daily. 30 capsule 0  . B Complex Vitamins (B COMPLEX 1 PO) Take by mouth.    [name] Kitchen BIOTIN PO Take by mouth.    [name] Kitchen buPROPion (WELLBUTRIN XL) 150 MG 24 hr tablet Take 1 tablet (150 mg total) by mouth daily. 90 tablet 1  . metoprolol tartrate (LOPRESSOR) 100 MG tablet Take 100 mg by mouth daily.     . valsartan (DIOVAN) 160 MG tablet Take 160 mg by mouth daily.    [name] Kitchen amphetamine-dextroamphetamine (ADDERALL XR) 20 MG 24 hr capsule Take 1 capsule (20 mg total) by mouth daily. 30 capsule 0  . amphetamine-dextroamphetamine (ADDERALL XR) 20 MG 24 hr capsule Take 1 capsule (20 mg total) by mouth daily. 30 capsule 0  . buPROPion (WELLBUTRIN XL) 150 MG 24 hr tablet TAKE 1 TABLET BY MOUTH EVERY DAY 90 tablet 0  . lisdexamfetamine (VYVANSE) 50 MG capsule Take 1 capsule (50 mg total) by mouth daily. 30 capsule 0  . [START ON 03/03/2020] lisdexamfetamine (VYVANSE) 50 MG capsule Take 1 capsule (50  mg total) by mouth daily. 30 capsule 0  . [START ON 03/31/2020] lisdexamfetamine (VYVANSE) 50 MG capsule Take 1 capsule (50 mg total) by mouth daily. 30 capsule 0   No current facility-administered medications for this visit.    Medication Side Effects: None  Allergies:  Allergies  Allergen Reactions  . Bactrim [Sulfamethoxazole-Trimethoprim] Hives  . Codeine Nausea And Vomiting  . Latex Hives    Past Medical History:  Diagnosis Date  . Basal cell carcinoma    leg  . Hypertension   . Migraine   . Vaginal Pap smear, abnormal    age 1 yr- cryo done    Family History  Problem Relation Age of Onset   . Diabetes Maternal Grandmother   . Cancer Maternal Grandfather        lung  . Hypertension Father   . Stroke Father   . Hypertension Mother   . Stroke Mother     Social History   Socioeconomic History  . Marital status: Married    Spouse name: Not on file  . Number of children: Not on file  . Years of education: Not on file  . Highest education level: Not on file  Occupational History  . Not on file  Tobacco Use  . Smoking status: Former Research scientist (life sciences)  . Smokeless tobacco: Never Used  Substance and Sexual Activity  . Alcohol use: Yes    Alcohol/week: 4.0 standard drinks    Types: 4 Glasses of wine per week  . Drug use: Never  . Sexual activity: Yes    Birth control/protection: I.U.D.  Other Topics Concern  . Not on file  Social History Narrative  . Not on file   Social Determinants of Health   Financial Resource Strain:   . Difficulty of Paying Living Expenses:   Food Insecurity:   . Worried About Charity fundraiser in the Last Year:   . Arboriculturist in the Last Year:   Transportation Needs:   . Film/video editor (Medical):   [name] Kitchen Lack of Transportation (Non-Medical):   Physical Activity:   . Days of Exercise per Week:   . Minutes of Exercise per Session:   Stress:   . Feeling of Stress :   Social Connections:   . Frequency of Communication with Friends and Family:   . Frequency of Social Gatherings with Friends and Family:   . Attends Religious Services:   . Active Member of Clubs or Organizations:   . Attends Archivist Meetings:   [name] Kitchen Marital Status:   Intimate Partner Violence:   . Fear of Current or Ex-Partner:   . Emotionally Abused:   [name] Kitchen Physically Abused:   . Sexually Abused:     Past Medical History, Surgical history, Social history, and Family history were reviewed and updated as appropriate.   Please see review of systems for further details on the patient's review from today.   Objective:   Physical Exam:  BP (!) 138/94    Pulse 70   Wt 183 lb (83 kg)   BMI 31.91 kg/m   Physical Exam Constitutional:      General: She is not in acute distress. Musculoskeletal:        General: No deformity.  Neurological:     Mental Status: She is alert and oriented to person, place, and time.     Coordination: Coordination normal.  Psychiatric:        Attention and Perception: Attention and perception normal. She does  not perceive auditory or visual hallucinations.        Mood and Affect: Mood normal. Mood is not anxious or depressed. Affect is not labile, blunt, angry or inappropriate.        Speech: Speech normal.        Behavior: Behavior normal.        Thought Content: Thought content normal. Thought content is not paranoid or delusional. Thought content does not include homicidal or suicidal ideation. Thought content does not include homicidal or suicidal plan.        Cognition and Memory: Cognition and memory normal.        Judgment: Judgment normal.     Comments: Insight intact     Lab Review:  No results found for: NA, K, CL, CO2, GLUCOSE, BUN, CREATININE, CALCIUM, PROT, ALBUMIN, AST, ALT, ALKPHOS, BILITOT, GFRNONAA, GFRAA  No results found for: WBC, RBC, HGB, HCT, PLT, MCV, MCH, MCHC, RDW, LYMPHSABS, MONOABS, EOSABS, BASOSABS  No results found for: POCLITH, LITHIUM   No results found for: PHENYTOIN, PHENOBARB, VALPROATE, CBMZ   .res Assessment: Plan:   She reports that she would like to switch from Adderall XR to Vyvanse since it had a longer duration and lasted throughout her work day.  Will d/c Adderall XR and start Vyvanse 50 mg po qd for ADD.  Continue Wellbutrin XL 150 mg po qd for depression.  Pt to f/u in 3 months or sooner if clinically indicated.  Patient advised to contact office with any questions, adverse effects, or acute worsening in signs and symptoms.  Gina Washington was seen today for follow-up.  Diagnoses and all orders for this visit:  Mild episode of recurrent major depressive disorder  (HCC) -     buPROPion (WELLBUTRIN XL) 150 MG 24 hr tablet; Take 1 tablet (150 mg total) by mouth daily.  Attention deficit hyperactivity disorder, combined type -     lisdexamfetamine (VYVANSE) 50 MG capsule; Take 1 capsule (50 mg total) by mouth daily. -     lisdexamfetamine (VYVANSE) 50 MG capsule; Take 1 capsule (50 mg total) by mouth daily. -     lisdexamfetamine (VYVANSE) 50 MG capsule; Take 1 capsule (50 mg total) by mouth daily.     Please see After Visit Summary for patient specific instructions.  Future Appointments  Date Time Provider [place]  03/24/2020 10:00 AM Anson [place], [facility] CP-CP None  05/05/2020  9:00 AM Anson [place], [facility] CP-CP None  05/05/2020 10:00 AM Thayer Headings, PMHNP CP-CP None    No orders of the defined types were placed in this encounter.   -------------------------------

## 2020-02-04 NOTE — Telephone Encounter (Signed)
She has apt this afternoon

## 2020-02-08 ENCOUNTER — Telehealth: Payer: Self-pay | Admitting: Psychiatry

## 2020-02-08 NOTE — Telephone Encounter (Signed)
[facility] [employer] approved coverage for Vyvanse 50 MG OR CAPS. Effective 02/08/2020 through 02/08/2023.

## 2020-03-24 ENCOUNTER — Other Ambulatory Visit: Payer: Self-pay

## 2020-03-24 ENCOUNTER — Ambulatory Visit (INDEPENDENT_AMBULATORY_CARE_PROVIDER_SITE_OTHER): Payer: BC Managed Care – PPO | Admitting: Mental Health

## 2020-03-24 DIAGNOSIS — F330 Major depressive disorder, recurrent, mild: Secondary | ICD-10-CM

## 2020-03-24 DIAGNOSIS — F902 Attention-deficit hyperactivity disorder, combined type: Secondary | ICD-10-CM

## 2020-04-11 NOTE — Progress Notes (Signed)
      [facility] Counselor/Therapist Progress Note  Patient ID: Aki Burdin, MRN: [id number],    Date: 03/24/20  Time Spent: 54 minutes  Treatment Type: Individual Therapy  Reported Symptoms: anxiety, depressed mood  Mental Status Exam:  Appearance:   Casual     Behavior:  Appropriate  Motor:  Normal  Speech/Language:   Clear and Coherent  Affect:  Congruent  Mood:  normal  Thought process:  normal  Thought content:    WNL  Sensory/Perceptual disturbances:    WNL  Orientation:  x4  Attention:  Good  Concentration:  Good  Memory:  WNL  Fund of knowledge:   Good  Insight:    Good  Judgment:   Good  Impulse Control:  Good   Risk Assessment: Danger to Self:  No Self-injurious Behavior: No Danger to Others: No Duty to Warn:no Physical Aggression / Violence:No  Access to Firearms a concern: No  Gang Involvement:No   Subjective: Patient presents for session sharing progress and experiences since her last visit which was about a year ago.  She shared how she continues to cope with functional movement disorder, sharing details, symptoms.  Reports that the relationship with her husband has improved, that he is been stable on medication for the last few months and detailed how this is been a positive change for him in the relationship.  Explored ways she is attempted to cope with her anxiety that is primarily centered around her functional movement disorder behavioral symptoms.  We explored coping she is attempted to utilize, some cognitive behavioral strategies she has learned.  We encouraged her to utilize other coping skills discussed such as mindfulness and diaphragmatic breathing, providing resource for review and application between visits.  Interventions: assessment, supportive therapy, CBT  Diagnosis:   ICD-10-CM   1. Mild episode of recurrent major depressive disorder ([place])  F33.0   2. Attention deficit hyperactivity disorder, combined type  F90.2    Plan: Patient is  to use CBT, mindfulness and coping skills to help manage decrease symptoms associated with their diagnosis.   Long-term goal:   Reduce overall level, frequency, and intensity of the feelings of anxiety and panic evidenced per patient report for at least 3 consecutive months.  Short-term goal:  Verbally express understanding of the relationship between feelings of anxiety and their impact on thinking patterns and behaviors. Verbalize an understanding of the role that distorted thinking plays in creating fears, excessive worry, and ruminations. Utilize coping skills as discussed in session Follow medication regimen and report any concerns to prescribing provider  Anson [place], [facility]

## 2020-04-27 ENCOUNTER — Telehealth: Payer: Self-pay | Admitting: Psychiatry

## 2020-04-27 NOTE — Telephone Encounter (Signed)
Patient should already have Rx's on file to use. Will contact pharmacy to confirm.

## 2020-04-27 NOTE — Telephone Encounter (Signed)
Patient called and needs a refill of vyvanse 50 mg [facility] [address] in [place]. Next appt 7/23

## 2020-05-05 ENCOUNTER — Encounter: Payer: Self-pay | Admitting: Psychiatry

## 2020-05-05 ENCOUNTER — Ambulatory Visit (INDEPENDENT_AMBULATORY_CARE_PROVIDER_SITE_OTHER): Payer: BC Managed Care – PPO | Admitting: Psychiatry

## 2020-05-05 ENCOUNTER — Other Ambulatory Visit: Payer: Self-pay

## 2020-05-05 ENCOUNTER — Ambulatory Visit (INDEPENDENT_AMBULATORY_CARE_PROVIDER_SITE_OTHER): Payer: BC Managed Care – PPO | Admitting: Mental Health

## 2020-05-05 VITALS — BP 158/98 | HR 64

## 2020-05-05 DIAGNOSIS — F330 Major depressive disorder, recurrent, mild: Secondary | ICD-10-CM

## 2020-05-05 DIAGNOSIS — F902 Attention-deficit hyperactivity disorder, combined type: Secondary | ICD-10-CM

## 2020-05-05 DIAGNOSIS — F411 Generalized anxiety disorder: Secondary | ICD-10-CM | POA: Diagnosis not present

## 2020-05-05 MED ORDER — LISDEXAMFETAMINE DIMESYLATE 50 MG PO CAPS: 50.0000 mg | ORAL_CAPSULE | Freq: Every day | ORAL | 0 refills | Status: DC

## 2020-05-05 NOTE — Progress Notes (Signed)
[facility] Counselor/Therapist Progress Note  Patient ID: Jenesis Martin, MRN: [id number],    Date: 05/05/20  Time Spent: 53 minutes  Treatment Type: Individual Therapy  Reported Symptoms: anxiety, depressed mood  Mental Status Exam:  Appearance:   Casual     Behavior:  Appropriate  Motor:  Normal  Speech/Language:   Clear and Coherent  Affect:  Congruent  Mood:  normal  Thought process:  normal  Thought content:    WNL  Sensory/Perceptual disturbances:    WNL  Orientation:  x4  Attention:  Good  Concentration:  Good  Memory:  WNL  Fund of knowledge:   Good  Insight:    Good  Judgment:   Good  Impulse Control:  Good   Risk Assessment: Danger to Self:  No Self-injurious Behavior: No Danger to Others: No Duty to Warn:no Physical Aggression / Violence:No  Access to Firearms a concern: No  Gang Involvement:No   Subjective: Patient presents for session in no distress.  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  She shared her efforts to reach out to their school administrator, expressing her concerns about this change due to functional limitations associated with her movement disorder.  She stated that she plans to follow-up with her neurologist and physical therapist at her next appointments to further discuss these concerns.  She stated that if she makes the change given by the administrator, she will have to walk across uneven surfaces and go up and down steps to the classroom daily.  She stated that these conditions could potentially lead her to become off balance and falling due to her condition.  She stated that she mentioned this to her administrator a month ago via email when she was notified by the administrator of this new class assignment but did not get a return email with  her response.  Engaged patient in some problem solving where plan was made for her to reach out to the school advocate through the school system to explore options related to these safety concerns.  Provide support and understanding as she processed various feelings related such as increasing anxiety.  She also identified anxiety related to taking the as needed steps while also feeling that the administrator may take some type of punitive action against her.  Encouraged her to remind herself that she is taking steps toward advocating for her needs and that of the students and how she is taking steps toward addressing these concerns through appropriate resources.  Interventions: assessment, supportive therapy, CBT  Diagnosis:   ICD-10-CM   1. Mild episode of recurrent major depressive disorder ([place])  F33.0   2. Attention deficit hyperactivity disorder, combined type  F90.2    Plan: Patient is to use CBT, mindfulness and coping skills to help manage decrease symptoms associated with their diagnosis.   Long-term goal:   Reduce overall level, frequency, and intensity of the feelings of anxiety and panic evidenced per patient report for at least 3 consecutive months.  Short-term goal:  Verbally express understanding of the relationship between feelings of anxiety and their impact on thinking patterns and behaviors. Verbalize an understanding of the role that distorted thinking plays in creating fears, excessive worry, and ruminations. Utilize coping skills as discussed in session Follow medication  regimen and report any concerns to prescribing provider  Anson [place], [facility]

## 2020-05-05 NOTE — Progress Notes (Signed)
Gina Washington [id number] [birth year] 54 y.o.  Subjective:   Patient ID:  Gina Washington is a 54 y.o. (DOB 27-Mar-1966) female.  Chief Complaint:  Chief Complaint  Patient presents with  . Follow-up    ADD, anxiety, depression    HPI Gina Washington presents to the office today for follow-up of ADD, Depression, and anxiety. She reports that she has been having some functional movement d/o s/s with supination of right foot. She notices that this occurs when she is putting on her make-up. She reports that certain movements are more difficult for her or the movement will occur in the opposite hand. She reports anxiety has been ok with summer break. Teaching a short summer session. She reports that her mood has been good. She reports improved motivation towards personal grooming and dress. Energy has been good. She reports that she has been sleeping ok with middle of the night awakenings 2-3 nights a week and is awake for about an hour before being able to return to sleep. Appetite has been good. She reports that she is afraid she will fall if she exercises. She reports that her concentration was ok until she lost her medication. Reports that she has been without medication for about 1.5 weeks. She reports that Vyvanse is more effective for her concentration and reports that BP was ok. Denies SI.   Reports that her husband rarely leaves home without her. Reports concern about husband's mental health.     Review of Systems:  Review of Systems  Musculoskeletal: Negative for gait problem.  Skin:       Had skin cancer removed on her leg  Neurological: Positive for weakness.       Supination of right foot  Psychiatric/Behavioral:       Please refer to HPI    Medications: I have reviewed the patient's current medications.  Current Outpatient Medications  Medication Sig Dispense Refill  . amLODipine (NORVASC) 5 MG tablet Take 5 mg by mouth daily.    . B Complex Vitamins (B COMPLEX 1 PO) Take by mouth.     [name] Kitchen BIOTIN PO Take by mouth.    . metoprolol tartrate (LOPRESSOR) 100 MG tablet Take 100 mg by mouth daily.     . valsartan (DIOVAN) 160 MG tablet Take 160 mg by mouth daily.    [name] Kitchen buPROPion (WELLBUTRIN XL) 150 MG 24 hr tablet Take 1 tablet (150 mg total) by mouth daily. 90 tablet 1  . [START ON 06/30/2020] lisdexamfetamine (VYVANSE) 50 MG capsule Take 1 capsule (50 mg total) by mouth daily. 30 capsule 0  . [START ON 06/02/2020] lisdexamfetamine (VYVANSE) 50 MG capsule Take 1 capsule (50 mg total) by mouth daily. 30 capsule 0  . lisdexamfetamine (VYVANSE) 50 MG capsule Take 1 capsule (50 mg total) by mouth daily. 30 capsule 0   No current facility-administered medications for this visit.    Medication Side Effects: None  Allergies:  Allergies  Allergen Reactions  . Bactrim [Sulfamethoxazole-Trimethoprim] Hives  . Codeine Nausea And Vomiting  . Latex Hives    Past Medical History:  Diagnosis Date  . Basal cell carcinoma    leg  . Basal cell carcinoma   . Hypertension   . Migraine   . Vaginal Pap smear, abnormal    age 79 yr- cryo done    Family History  Problem Relation Age of Onset  . Diabetes Maternal Grandmother   . Cancer Maternal Grandfather        lung  . Hypertension  Father   . Stroke Father   . Hypertension Mother   . Stroke Mother     Social History   Socioeconomic History  . Marital status: Married    Spouse name: Not on file  . Number of children: Not on file  . Years of education: Not on file  . Highest education level: Not on file  Occupational History  . Not on file  Tobacco Use  . Smoking status: Former Research scientist (life sciences)  . Smokeless tobacco: Never Used  Vaping Use  . Vaping Use: Never used  Substance and Sexual Activity  . Alcohol use: Yes    Alcohol/week: 4.0 standard drinks    Types: 4 Glasses of wine per week  . Drug use: Never  . Sexual activity: Yes    Birth control/protection: I.U.D.  Other Topics Concern  . Not on file  Social History  Narrative  . Not on file   Social Determinants of Health   Financial Resource Strain:   . Difficulty of Paying Living Expenses:   Food Insecurity:   . Worried About Charity fundraiser in the Last Year:   . Arboriculturist in the Last Year:   Transportation Needs:   . Film/video editor (Medical):   [name] Kitchen Lack of Transportation (Non-Medical):   Physical Activity:   . Days of Exercise per Week:   . Minutes of Exercise per Session:   Stress:   . Feeling of Stress :   Social Connections:   . Frequency of Communication with Friends and Family:   . Frequency of Social Gatherings with Friends and Family:   . Attends Religious Services:   . Active Member of Clubs or Organizations:   . Attends Archivist Meetings:   [name] Kitchen Marital Status:   Intimate Partner Violence:   . Fear of Current or Ex-Partner:   . Emotionally Abused:   [name] Kitchen Physically Abused:   . Sexually Abused:     Past Medical History, Surgical history, Social history, and Family history were reviewed and updated as appropriate.   Please see review of systems for further details on the patient's review from today.   Objective:   Physical Exam:  BP (!) 158/98   Pulse 64   Physical Exam Constitutional:      General: She is not in acute distress. Musculoskeletal:        General: No deformity.  Neurological:     Mental Status: She is alert and oriented to person, place, and time.     Coordination: Coordination normal.  Psychiatric:        Attention and Perception: Attention and perception normal. She does not perceive auditory or visual hallucinations.        Mood and Affect: Mood normal. Mood is not anxious or depressed. Affect is not labile, blunt, angry or inappropriate.        Speech: Speech normal.        Behavior: Behavior normal.        Thought Content: Thought content normal. Thought content is not paranoid or delusional. Thought content does not include homicidal or suicidal ideation. Thought content  does not include homicidal or suicidal plan.        Cognition and Memory: Cognition and memory normal.        Judgment: Judgment normal.     Comments: Insight intact     Lab Review:  No results found for: NA, K, CL, CO2, GLUCOSE, BUN, CREATININE, CALCIUM, PROT, ALBUMIN, AST, ALT, ALKPHOS,  BILITOT, GFRNONAA, GFRAA  No results found for: WBC, RBC, HGB, HCT, PLT, MCV, MCH, MCHC, RDW, LYMPHSABS, MONOABS, EOSABS, BASOSABS  No results found for: POCLITH, LITHIUM   No results found for: PHENYTOIN, PHENOBARB, VALPROATE, CBMZ   .res Assessment: Plan:   Patient reports that Vyvanse has been more effective for her attention deficit disorder signs and symptoms compared to Adderall XR and that she was recently able to use a co-pay savings card that helped reduce her cost. Patient reports that she would like to continue Vyvanse 50 mg daily for ADD. Continue Wellbutrin XL 150 mg daily for depression. Recommend continuing psychotherapy with Lanetta Inch, [facility]. Patient to follow-up with this provider in 3 months or sooner if clinically indicated. Patient advised to contact office with any questions, adverse effects, or acute worsening in signs and symptoms.  Hermione was seen today for follow-up.  Diagnoses and all orders for this visit:  Attention deficit hyperactivity disorder, combined type -     lisdexamfetamine (VYVANSE) 50 MG capsule; Take 1 capsule (50 mg total) by mouth daily. -     lisdexamfetamine (VYVANSE) 50 MG capsule; Take 1 capsule (50 mg total) by mouth daily. -     lisdexamfetamine (VYVANSE) 50 MG capsule; Take 1 capsule (50 mg total) by mouth daily.  Generalized anxiety disorder  Mild episode of recurrent major depressive disorder ([place])     Please see After Visit Summary for patient specific instructions.  Future Appointments  Date Time Provider [place]  06/14/2020  3:00 PM Anson [place], [facility] CP-CP None  08/04/2020  4:00 PM Thayer Headings, PMHNP CP-CP  None    No orders of the defined types were placed in this encounter.   -------------------------------

## 2020-06-14 ENCOUNTER — Ambulatory Visit (INDEPENDENT_AMBULATORY_CARE_PROVIDER_SITE_OTHER): Payer: BC Managed Care – PPO | Admitting: Mental Health

## 2020-06-14 ENCOUNTER — Other Ambulatory Visit: Payer: Self-pay

## 2020-06-14 DIAGNOSIS — F330 Major depressive disorder, recurrent, mild: Secondary | ICD-10-CM
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
## 2020-06-27 ENCOUNTER — Ambulatory Visit: Payer: BC Managed Care – PPO | Admitting: Neurology

## 2020-07-06 ENCOUNTER — Encounter: Payer: Self-pay | Admitting: *Deleted

## 2020-07-11 ENCOUNTER — Telehealth: Payer: Self-pay | Admitting: Neurology

## 2020-07-11 ENCOUNTER — Encounter: Payer: Self-pay | Admitting: Neurology

## 2020-07-11 ENCOUNTER — Other Ambulatory Visit: Payer: Self-pay

## 2020-07-11 ENCOUNTER — Ambulatory Visit (INDEPENDENT_AMBULATORY_CARE_PROVIDER_SITE_OTHER): Payer: BC Managed Care – PPO | Admitting: Neurology

## 2020-07-11 VITALS — BP 126/89 | HR 67 | Ht 63.5 in | Wt 188.0 lb

## 2020-07-11 DIAGNOSIS — E538 Deficiency of other specified B group vitamins: Secondary | ICD-10-CM | POA: Diagnosis not present

## 2020-07-11 DIAGNOSIS — R29898 Other symptoms and signs involving the musculoskeletal system: Secondary | ICD-10-CM | POA: Insufficient documentation

## 2020-07-11 MED ORDER — CARBIDOPA-LEVODOPA 25-100 MG PO TABS
1.0000 | ORAL_TABLET | Freq: Three times a day (TID) | ORAL | 6 refills | Status: DC
Start: 2020-07-11 — End: 2020-11-16

## 2020-07-11 NOTE — Progress Notes (Signed)
Chief Complaint  Patient presents with  . New Patient (Initial Visit)    Reports right foot drags, along with rolling over to the side at times. She has a tremor in her right hand. States she was previously diagnosed with functional movement disorder in the past by Dr. Hall Washington at [facility]. She is here to establish new care today. She had some improvement with PT & OT. She is currently still in cognitive/behavioral therapy. She also has a pending appt to start a new therapy in November (EMDR - eye movement desensitization and reprocessing).        [name] Kitchen PCP    Gina Washington., MD    HISTORICAL  Gina Washington is a 55 year old female, seen in request by her primary care physician Dr. Bea Washington, Gina Washington for evaluation of right side difficulty, initial evaluation was on July 11, 2020  I reviewed and summarized the referring note.  Past medical history Hypertension Depression  About 10 years ago, around 2011, she began to noticed mild gait abnormality, dragging her right leg across the floor, at the same time she noticed changed sense of smell, she can smell smoke when there was no cigarette around, later her sense of smell slightly decreased, but she never lost the taste,  She works as a Product/process development scientist, over the years, she noticed gradual worsening right leg stiffness, she described tendency of her right leg to rollover to her ankle plantarflexion position, stinging sensation radiating down right lateral ankle, right lateral leg  She also describes loss of dexterity of her right hand, difficult to have neat hand writing, tends to hold her right hand in clawed position, difficulty brushing her teeth, has to change to electronic toothbrush   Over the years, she was seen by different neurologist, most recent evaluation was by Gina Washington at [facility] in May 2020, reported normal MRI of brain in the past, was diagnosed with functional movement disorder, had received  a physical therapy and occupational therapy with only temporary improvement  She never received any medication treatment in the past per patient,   Laboratory evaluations in August 2021 showed B12 deficiency level was 173, normal CMP, hemoglobin 14.6.  REVIEW OF SYSTEMS: Full 14 system review of systems performed and notable only for as above All other review of systems were negative.  ALLERGIES: Allergies  Allergen Reactions  . Bactrim [Sulfamethoxazole-Trimethoprim] Hives  . Codeine Nausea And Vomiting  . Latex Hives    HOME MEDICATIONS: Current Outpatient Medications  Medication Sig Dispense Refill  . amLODipine (NORVASC) 5 MG tablet Take 5 mg by mouth daily.    . B Complex Vitamins (B COMPLEX 1 PO) Take by mouth.    [name] Kitchen BIOTIN PO Take by mouth.    [name] Kitchen buPROPion (WELLBUTRIN XL) 150 MG 24 hr tablet Take 1 tablet (150 mg total) by mouth daily. 90 tablet 1  . lisdexamfetamine (VYVANSE) 50 MG capsule Take 1 capsule (50 mg total) by mouth daily. 30 capsule 0  . metoprolol tartrate (LOPRESSOR) 100 MG tablet Take 100 mg by mouth daily.     . valsartan (DIOVAN) 160 MG tablet Take 160 mg by mouth daily.     No current facility-administered medications for this visit.    PAST MEDICAL HISTORY: Past Medical History:  Diagnosis Date  . ADHD   . Basal cell carcinoma    leg  . Basal cell carcinoma   . Functional movement disorder   . Herpes zoster without complication   .  Hypertension   . Migraine   . Vaginal Pap smear, abnormal    age 4 yr- cryo done    PAST SURGICAL HISTORY: Past Surgical History:  Procedure Laterality Date  . CESAREAN SECTION    . CRYOTHERAPY      FAMILY HISTORY: Family History  Problem Relation Age of Onset  . Diabetes Maternal Grandmother   . Cancer Maternal Grandfather        lung  . Hypertension Father   . Stroke Father   . Hypertension Mother   . Stroke Mother   . Dementia Paternal Grandmother     SOCIAL HISTORY: Social History    Socioeconomic History  . Marital status: Married    Spouse name: Not on file  . Number of children: 1  . Years of education: college  . Highest education level: Bachelor's degree (e.g., BA, AB, BS)  Occupational History  . Occupation: Pharmacist, hospital  Tobacco Use  . Smoking status: Former Research scientist (life sciences)  . Smokeless tobacco: Never Used  Vaping Use  . Vaping Use: Never used  Substance and Sexual Activity  . Alcohol use: Yes    Comment: 5 drinks per week  . Drug use: Never  . Sexual activity: Yes    Birth control/protection: I.U.D.  Other Topics Concern  . Not on file  Social History Narrative   Lives at home with her husband.   Right-handed.   One cup coffee per day.   Social Determinants of Health   Financial Resource Strain:   . Difficulty of Paying Living Expenses: Not on file  Food Insecurity:   . Worried About Charity fundraiser in the Last Year: Not on file  . Ran Out of Food in the Last Year: Not on file  Transportation Needs:   . Lack of Transportation (Medical): Not on file  . Lack of Transportation (Non-Medical): Not on file  Physical Activity:   . Days of Exercise per Week: Not on file  . Minutes of Exercise per Session: Not on file  Stress:   . Feeling of Stress : Not on file  Social Connections:   . Frequency of Communication with Friends and Family: Not on file  . Frequency of Social Gatherings with Friends and Family: Not on file  . Attends Religious Services: Not on file  . Active Member of Clubs or Organizations: Not on file  . Attends Archivist Meetings: Not on file  . Marital Status: Not on file  Intimate Partner Violence:   . Fear of Current or Ex-Partner: Not on file  . Emotionally Abused: Not on file  . Physically Abused: Not on file  . Sexually Abused: Not on file     PHYSICAL EXAM   Vitals:   07/11/20 0748  BP: 126/89  Pulse: 67  Weight: 188 lb (85.3 kg)  Height: 5' 3.5" (1.613 m)   Not recorded     Body mass index is 32.78  kg/m.  PHYSICAL EXAMNIATION:  Gen: NAD, conversant, well nourised, well groomed                     Cardiovascular: Regular rate rhythm, no peripheral edema, warm, nontender. Eyes: Conjunctivae clear without exudates or hemorrhage Neck: Supple, no carotid bruits. Pulmonary: Clear to auscultation bilaterally   NEUROLOGICAL EXAM:  MENTAL STATUS: Speech:    Speech is normal; fluent and spontaneous with normal comprehension.  Cognition:     Orientation to time, place and person     Normal recent  and remote memory     Normal Attention span and concentration     Normal Language, naming, repeating,spontaneous speech     Fund of knowledge   CRANIAL NERVES: CN II: Visual fields are full to confrontation. Pupils are round equal and briskly reactive to light. CN III, IV, VI: extraocular movement are normal. No ptosis. CN V: Facial sensation is intact to light touch CN VII: Face is symmetric with normal eye closure  CN VIII: Hearing is normal to causal conversation. CN IX, X: Phonation is normal. CN XI: Head turning and shoulder shrug are intact  MOTOR: Muscle strength is normal, she has inconsistent right arm and foot bradykinesia, rigidity, slower movement  REFLEXES: Reflexes are 2+ and symmetric at the biceps, triceps, knees, and ankles. Plantar responses are flexor.  SENSORY: Intact to light touch, pinprick and vibratory sensation are intact in fingers and toes.  COORDINATION: There is no trunk or limb dysmetria noted.  GAIT/STANCE: She can get up from seated position arm crossed, decreased right arm swing, moderate stride, dragging right leg occasionally  DIAGNOSTIC DATA (LABS, IMAGING, TESTING) - I reviewed patient records, labs, notes, testing and imaging myself where available.   ASSESSMENT AND PLAN  Gina Washington is a 54 y.o. female   Long history of gradual onset slowly progressive right arm and leg rigidity, also reported loss sense of smell,  She does have mild  parkinsonian features on examination, but inconsistent, was diagnosed with functional movement disorder by movement specialist at [facility] Dr. Hall Washington in the past, received physical therapy occupational therapy which was helpful, but never had any medication treatment in the past,  She desires further evaluation, will proceed with MRI of brain, DaTSCAN of brain,  Give her a trial of Sinemet 25/100 mg 3 times daily  Return to clinic in 2 to 3 months   Marcial Pacas, M.D. Ph.D.  [facility] [address], [place], [place] [address] Ph: [phone] Fax: [phone]  CC:  Gina Washington., MD [address] [place],   [address]

## 2020-07-11 NOTE — Telephone Encounter (Signed)
no to the covid questions MR Brain wo contrast Dr. Willette Pa Auth: [id number] (exp. 07/11/20 to 01/06/21) . Patient is scheduled at [facility] for 07/18/20

## 2020-07-18 ENCOUNTER — Other Ambulatory Visit: Payer: Self-pay

## 2020-07-18 ENCOUNTER — Ambulatory Visit (INDEPENDENT_AMBULATORY_CARE_PROVIDER_SITE_OTHER): Payer: BC Managed Care – PPO

## 2020-07-18 DIAGNOSIS — R29898 Other symptoms and signs involving the musculoskeletal system: Secondary | ICD-10-CM | POA: Diagnosis not present

## 2020-07-18 DIAGNOSIS — E538 Deficiency of other specified B group vitamins: Secondary | ICD-10-CM

## 2020-07-24 ENCOUNTER — Ambulatory Visit: Payer: BC Managed Care – PPO | Admitting: Psychiatry

## 2020-08-04 ENCOUNTER — Ambulatory Visit: Payer: BC Managed Care – PPO | Admitting: Psychiatry

## 2020-08-14 ENCOUNTER — Ambulatory Visit: Payer: BC Managed Care – PPO | Admitting: Psychiatry

## 2020-08-21 ENCOUNTER — Other Ambulatory Visit: Payer: Self-pay

## 2020-08-21 ENCOUNTER — Telehealth: Payer: Self-pay | Admitting: Psychiatry

## 2020-08-21 ENCOUNTER — Encounter: Payer: Self-pay | Admitting: Psychiatry

## 2020-08-21 ENCOUNTER — Ambulatory Visit (INDEPENDENT_AMBULATORY_CARE_PROVIDER_SITE_OTHER): Payer: BC Managed Care – PPO | Admitting: Psychiatry

## 2020-08-21 DIAGNOSIS — F902 Attention-deficit hyperactivity disorder, combined type: Secondary | ICD-10-CM

## 2020-08-21 DIAGNOSIS — F330 Major depressive disorder, recurrent, mild: Secondary | ICD-10-CM | POA: Diagnosis not present

## 2020-08-21 NOTE — Telephone Encounter (Signed)
Patient needs a refill for Vyvanse called to [facility]. Patient next appt with Janett Billow is 11/23.
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
## 2020-08-21 NOTE — Telephone Encounter (Signed)
Last refill 07/18/20 Pended for Janett Billow to send

## 2020-08-22 MED ORDER — LISDEXAMFETAMINE DIMESYLATE 50 MG PO CAPS: 50.0000 mg | ORAL_CAPSULE | Freq: Every day | ORAL | 0 refills | Status: DC

## 2020-08-24 ENCOUNTER — Other Ambulatory Visit: Payer: Self-pay | Admitting: Psychiatry

## 2020-08-24 DIAGNOSIS — F330 Major depressive disorder, recurrent, mild: Secondary | ICD-10-CM

## 2020-08-29 ENCOUNTER — Other Ambulatory Visit: Payer: Self-pay

## 2020-08-29 ENCOUNTER — Encounter: Payer: Self-pay | Admitting: Psychiatry

## 2020-08-29 ENCOUNTER — Ambulatory Visit (INDEPENDENT_AMBULATORY_CARE_PROVIDER_SITE_OTHER): Payer: BC Managed Care – PPO | Admitting: Psychiatry

## 2020-08-29 DIAGNOSIS — F330 Major depressive disorder, recurrent, mild: Secondary | ICD-10-CM | POA: Diagnosis not present
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
## 2020-09-01 ENCOUNTER — Ambulatory Visit: Payer: BC Managed Care – PPO | Admitting: Psychiatry

## 2020-09-04 ENCOUNTER — Other Ambulatory Visit: Payer: Self-pay

## 2020-09-04 ENCOUNTER — Ambulatory Visit (INDEPENDENT_AMBULATORY_CARE_PROVIDER_SITE_OTHER): Payer: BC Managed Care – PPO | Admitting: Psychiatry

## 2020-09-04 ENCOUNTER — Encounter: Payer: Self-pay | Admitting: Psychiatry

## 2020-09-04 DIAGNOSIS — F330 Major depressive disorder, recurrent, mild: Secondary | ICD-10-CM

## 2020-09-04 NOTE — Progress Notes (Signed)
      [facility] Counselor/Therapist Progress Note  Patient ID: Gina Washington, MRN: [id number],    Date: 09/04/2020  Time Spent: 50 minutes   Treatment Type: Individual Therapy  Reported Symptoms: depressed  Mental Status Exam:  Appearance:   Casual     Behavior:  Appropriate  Motor:  Normal  Speech/Language:   Clear and Coherent  Affect:  Appropriate  Mood:  depressed  Thought process:  normal  Thought content:    WNL  Sensory/Perceptual disturbances:    WNL  Orientation:  oriented to person, place, time/date and situation  Attention:  Good  Concentration:  Good  Memory:  WNL  Fund of knowledge:   Good  Insight:    Good  Judgment:   Good  Impulse Control:  Good   Risk Assessment: Danger to Self:  No Self-injurious Behavior: No Danger to Others: No Duty to Warn:no Physical Aggression / Violence:No  Access to Firearms a concern: No  Gang Involvement:No   Subjective: The client states that after last session she thinks much less about the student in [facility].  "I am glad it is over."  She has no residual emotional content that comes up connected to it.  She states that her functional mood disorder is less aggravating.  She does not identify any other traumas connected to the development of her FMD.  Today we used eye-movement focusing on the physicality of her functional mood disorder.  The client started with her foot on her right side that tends to want to turn in.  As the client processed she noticed that her leg became more tired.  We also focused on her right arm using eye-movement while she held it up.  She stated she felt more tired with it and heavier.  I asked the client what her leg would say if it could talk?  She stated, "I want it to listen to me."  As the client repeated this over in her head I used rapid eye-movement with her.  At the end of the set she stated it changed to "rest and relax."  I asked the client to continue with that and did another set.   The client stated that the sentence changed to "trust and obey".  As the client focused on this it changed to "trust to obey ".  As we continue the client felt much more relaxed.  She stated she probably needed to start doing her physical therapy and occupational therapy exercises again.  I suggested to the client that this was a good idea that she needed to have faith in her exercises and pursue that.  She will evaluate over the next 2 weeks work on her progress that she has made.  Interventions: Motivational Interviewing, Solution-Oriented/Positive Psychology, Comptroller and Reprocessing (EMDR) and Insight-Oriented  Diagnosis:   ICD-10-CM   1. Mild episode of recurrent major depressive disorder ([place])  F33.0     Plan: Restart PT and OT exercises, positive self talk, self-care, evaluate progress.  Gina Washington, [facility]

## 2020-09-05 ENCOUNTER — Telehealth (INDEPENDENT_AMBULATORY_CARE_PROVIDER_SITE_OTHER): Payer: BC Managed Care – PPO | Admitting: Psychiatry

## 2020-09-05 ENCOUNTER — Telehealth: Payer: Self-pay | Admitting: Psychiatry

## 2020-09-05 ENCOUNTER — Encounter: Payer: Self-pay | Admitting: Psychiatry

## 2020-09-05 DIAGNOSIS — F330 Major depressive disorder, recurrent, mild: Secondary | ICD-10-CM | POA: Diagnosis not present

## 2020-09-05 DIAGNOSIS — F902 Attention-deficit hyperactivity disorder, combined type: Secondary | ICD-10-CM | POA: Diagnosis not present

## 2020-09-05 MED ORDER — LISDEXAMFETAMINE DIMESYLATE 50 MG PO CAPS: 50.0000 mg | ORAL_CAPSULE | Freq: Every day | ORAL | 0 refills | Status: DC

## 2020-09-05 MED ORDER — BUPROPION HCL ER (XL) 150 MG PO TB24: 150.0000 mg | ORAL_TABLET | Freq: Every day | ORAL | 1 refills | Status: DC

## 2020-09-05 NOTE — Telephone Encounter (Signed)
[name], stege are scheduled for a virtual visit with your provider today.    Just as we do with appointments in the office, we must obtain your consent to participate.  Your consent will be active for this visit and any virtual visit you may have with one of our providers in the next 365 days.    If you have a MyChart account, I can also send a copy of this consent to you electronically.  All virtual visits are billed to your insurance company just like a traditional visit in the office.  As this is a virtual visit, video technology does not allow for your provider to perform a traditional examination.  This may limit your provider's ability to fully assess your condition.  If your provider identifies any concerns that need to be evaluated in person or the need to arrange testing such as labs, EKG, etc, we will make arrangements to do so.    Although advances in technology are sophisticated, we cannot ensure that it will always work on either your end or our end.  If the connection with a video visit is poor, we may have to switch to a telephone visit.  With either a video or telephone visit, we are not always able to ensure that we have a secure connection.   I need to obtain your verbal consent now.   Are you willing to proceed with your visit today?   Gina Washington has provided verbal consent on 09/05/2020 for a virtual visit (video or telephone).   Thayer Headings, PMHNP 09/05/2020  3:38 PM

## 2020-09-05 NOTE — Progress Notes (Signed)
Gina Washington [id number] [birth year] 54 y.o.  Virtual Visit via Video Note  I connected with pt @ on 09/05/20 at  3:30 PM EST by a video enabled telemedicine application and verified that I am speaking with the correct person using two identifiers.   I discussed the limitations of evaluation and management by telemedicine and the availability of in person appointments. The patient expressed understanding and agreed to proceed.  I discussed the assessment and treatment plan with the patient. The patient was provided an opportunity to ask questions and all were answered. The patient agreed with the plan and demonstrated an understanding of the instructions.   The patient was advised to call back or seek an in-person evaluation if the symptoms worsen or if the condition fails to improve as anticipated.  I provided 30 minutes of non-face-to-face time during this encounter.  The patient was located at home.  The provider was located at [place].   Thayer Headings, PMHNP   Subjective:   Patient ID:  Gina Washington is a 54 y.o. (DOB September 25, 1966) female.  Chief Complaint:  Chief Complaint  Patient presents with  . Depression  . Anxiety  . ADD    HPI Gina Washington presents for follow-up of depression, anxiety, and ADD. She reports that she has been very busy with work. She has started EMDR with Georgana Curio, [facility]. She has noticed some slight improvement in some of her functional movement s/s. She reports that her anxiety s/s have been "good." Denies any recent depression. She reports that her concentration has been adequate and Vyvanse has been helpful. She reports that Vyvanse has been effective throughout her work day. Sleeping well. Appetite has been the same. Energy and motivation have been good. Has been looking forward to decorating for Christmas. Denies SI.   She reports that her husband is doing well. She bought a new car. She reports that she has a good group of coworkers and this  has helped her work stress.   Review of Systems:  Review of Systems  Cardiovascular: Negative for palpitations.  Musculoskeletal: Negative for gait problem.  Neurological:       Reports improved functional movement s/s.   Psychiatric/Behavioral:       Please refer to HPI    Medications: I have reviewed the patient's current medications.  Current Outpatient Medications  Medication Sig Dispense Refill  . amLODipine (NORVASC) 5 MG tablet Take 5 mg by mouth daily.    . B Complex Vitamins (B COMPLEX 1 PO) Take by mouth.    [name] Kitchen BIOTIN PO Take by mouth.    [name] Kitchen buPROPion (WELLBUTRIN XL) 150 MG 24 hr tablet Take 1 tablet (150 mg total) by mouth daily. 90 tablet 1  . [START ON 09/19/2020] lisdexamfetamine (VYVANSE) 50 MG capsule Take 1 capsule (50 mg total) by mouth daily. 30 capsule 0  . metoprolol tartrate (LOPRESSOR) 100 MG tablet Take 100 mg by mouth daily.     . valsartan (DIOVAN) 160 MG tablet Take 160 mg by mouth daily.    . carbidopa-levodopa (SINEMET IR) 25-100 MG tablet Take 1 tablet by mouth 3 (three) times daily. (Patient not taking: Reported on 09/05/2020) 90 tablet 6  . [START ON 10/17/2020] lisdexamfetamine (VYVANSE) 50 MG capsule Take 1 capsule (50 mg total) by mouth daily. 30 capsule 0  . [START ON 11/14/2020] lisdexamfetamine (VYVANSE) 50 MG capsule Take 1 capsule (50 mg total) by mouth daily. 30 capsule 0   No current facility-administered medications for this visit.  Medication Side Effects: None  Allergies:  Allergies  Allergen Reactions  . Bactrim [Sulfamethoxazole-Trimethoprim] Hives  . Codeine Nausea And Vomiting  . Latex Hives    Past Medical History:  Diagnosis Date  . ADHD   . Basal cell carcinoma    leg  . Basal cell carcinoma   . Functional movement disorder   . Herpes zoster without complication   . Hypertension   . Migraine   . Vaginal Pap smear, abnormal    age 66 yr- cryo done    Family History  Problem Relation Age of Onset  . Diabetes Maternal  Grandmother   . Cancer Maternal Grandfather        lung  . Hypertension Father   . Stroke Father   . Hypertension Mother   . Stroke Mother   . Dementia Paternal Grandmother     Social History   Socioeconomic History  . Marital status: Married    Spouse name: Not on file  . Number of children: 1  . Years of education: college  . Highest education level: Bachelor's degree (e.g., BA, AB, BS)  Occupational History  . Occupation: Pharmacist, hospital  Tobacco Use  . Smoking status: Former Research scientist (life sciences)  . Smokeless tobacco: Never Used  Vaping Use  . Vaping Use: Never used  Substance and Sexual Activity  . Alcohol use: Yes    Comment: 5 drinks per week  . Drug use: Never  . Sexual activity: Yes    Birth control/protection: I.U.D.  Other Topics Concern  . Not on file  Social History Narrative   Lives at home with her husband.   Right-handed.   One cup coffee per day.   Social Determinants of Health   Financial Resource Strain:   . Difficulty of Paying Living Expenses: Not on file  Food Insecurity:   . Worried About Charity fundraiser in the Last Year: Not on file  . Ran Out of Food in the Last Year: Not on file  Transportation Needs:   . Lack of Transportation (Medical): Not on file  . Lack of Transportation (Non-Medical): Not on file  Physical Activity:   . Days of Exercise per Week: Not on file  . Minutes of Exercise per Session: Not on file  Stress:   . Feeling of Stress : Not on file  Social Connections:   . Frequency of Communication with Friends and Family: Not on file  . Frequency of Social Gatherings with Friends and Family: Not on file  . Attends Religious Services: Not on file  . Active Member of Clubs or Organizations: Not on file  . Attends Archivist Meetings: Not on file  . Marital Status: Not on file  Intimate Partner Violence:   . Fear of Current or Ex-Partner: Not on file  . Emotionally Abused: Not on file  . Physically Abused: Not on file  . Sexually  Abused: Not on file    Past Medical History, Surgical history, Social history, and Family history were reviewed and updated as appropriate.   Please see review of systems for further details on the patient's review from today.   Objective:   Physical Exam:  BP 125/75   Physical Exam Neurological:     Mental Status: She is alert and oriented to person, place, and time.     Cranial Nerves: No dysarthria.  Psychiatric:        Attention and Perception: Attention and perception normal.        Mood and  Affect: Mood normal.        Speech: Speech normal.        Behavior: Behavior is cooperative.        Thought Content: Thought content normal. Thought content is not paranoid or delusional. Thought content does not include homicidal or suicidal ideation. Thought content does not include homicidal or suicidal plan.        Cognition and Memory: Cognition and memory normal.     Comments: Insight intact     Lab Review:  No results found for: NA, K, CL, CO2, GLUCOSE, BUN, CREATININE, CALCIUM, PROT, ALBUMIN, AST, ALT, ALKPHOS, BILITOT, GFRNONAA, GFRAA  No results found for: WBC, RBC, HGB, HCT, PLT, MCV, MCH, MCHC, RDW, LYMPHSABS, MONOABS, EOSABS, BASOSABS  No results found for: POCLITH, LITHIUM   No results found for: PHENYTOIN, PHENOBARB, VALPROATE, CBMZ   .res Assessment: Plan:   Will continue Wellbutin XL 150 mg po qd for depression.  Continue Vyvanse 50 mg po qd for ADHD.  Pt to follow-up in 3 months or sooner if clinically indicated.  Patient advised to contact office with any questions, adverse effects, or acute worsening in signs and symptoms.  Hermila was seen today for depression, anxiety and add.  Diagnoses and all orders for this visit:  Attention deficit hyperactivity disorder, combined type -     lisdexamfetamine (VYVANSE) 50 MG capsule; Take 1 capsule (50 mg total) by mouth daily. -     lisdexamfetamine (VYVANSE) 50 MG capsule; Take 1 capsule (50 mg total) by mouth  daily. -     lisdexamfetamine (VYVANSE) 50 MG capsule; Take 1 capsule (50 mg total) by mouth daily.  Mild episode of recurrent major depressive disorder (HCC) -     buPROPion (WELLBUTRIN XL) 150 MG 24 hr tablet; Take 1 tablet (150 mg total) by mouth daily.     Please see After Visit Summary for patient specific instructions.  Future Appointments  Date Time Provider [place]  10/16/2020  8:00 AM [facility] INJ 1 [facility]  10/16/2020  1:00 PM MC-NM 1 [facility]  10/26/2020  3:00 PM Marcial Pacas, MD GNA-[facility]NA None  11/02/2020  9:00 AM May, [place], [facility] CP-CP None  11/07/2020  3:00 PM May, [place], [facility] CP-CP None  11/16/2020  4:00 PM May, [place], [facility] CP-CP None  12/07/2020  3:30 PM Thayer Headings, PMHNP CP-CP None    No orders of the defined types were placed in this encounter.     -------------------------------

## 2020-09-11 ENCOUNTER — Ambulatory Visit: Payer: BC Managed Care – PPO | Admitting: Psychiatry

## 2020-10-16 ENCOUNTER — Other Ambulatory Visit (HOSPITAL_COMMUNITY): Payer: BC Managed Care – PPO

## 2020-10-16 ENCOUNTER — Ambulatory Visit: Payer: BC Managed Care – PPO | Admitting: Neurology

## 2020-10-17 ENCOUNTER — Other Ambulatory Visit (HOSPITAL_COMMUNITY): Payer: BC Managed Care – PPO

## 2020-10-25 ENCOUNTER — Other Ambulatory Visit (HOSPITAL_COMMUNITY): Payer: BC Managed Care – PPO

## 2020-10-26 ENCOUNTER — Ambulatory Visit: Payer: BC Managed Care – PPO | Admitting: Neurology

## 2020-11-02 ENCOUNTER — Ambulatory Visit: Payer: BC Managed Care – PPO | Admitting: Psychiatry

## 2020-11-07 ENCOUNTER — Ambulatory Visit (INDEPENDENT_AMBULATORY_CARE_PROVIDER_SITE_OTHER): Payer: BC Managed Care – PPO | Admitting: Psychiatry

## 2020-11-07 ENCOUNTER — Encounter: Payer: Self-pay | Admitting: Psychiatry

## 2020-11-07 ENCOUNTER — Other Ambulatory Visit: Payer: Self-pay

## 2020-11-07 DIAGNOSIS — F330 Major depressive disorder, recurrent, mild: Secondary | ICD-10-CM | POA: Diagnosis not present
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
## 2020-11-10 ENCOUNTER — Telehealth: Payer: Self-pay | Admitting: Neurology

## 2020-11-10 NOTE — Telephone Encounter (Signed)
Patient was approved back in Sept. I will have get this approved again . I will CX her DAT Scan apt . Patient states she didn't have because she was waiting on her flex spending .  I will Start approval today may take over a week to get get approval back . I have called [facility] .  Thanks [place] .

## 2020-11-10 NOTE — Telephone Encounter (Signed)
Tillie Rung from [facility] called for a PA for outpatient procedure.  Best contact: [phone] ext. [phone]

## 2020-11-15 ENCOUNTER — Encounter (HOSPITAL_COMMUNITY): Payer: BC Managed Care – PPO

## 2020-11-15 ENCOUNTER — Encounter (HOSPITAL_COMMUNITY): Payer: Self-pay

## 2020-11-16 ENCOUNTER — Ambulatory Visit (INDEPENDENT_AMBULATORY_CARE_PROVIDER_SITE_OTHER): Payer: BC Managed Care – PPO | Admitting: Family Medicine

## 2020-11-16 ENCOUNTER — Other Ambulatory Visit: Payer: Self-pay

## 2020-11-16 ENCOUNTER — Ambulatory Visit: Payer: BC Managed Care – PPO | Admitting: Psychiatry

## 2020-11-16 ENCOUNTER — Encounter (INDEPENDENT_AMBULATORY_CARE_PROVIDER_SITE_OTHER): Payer: Self-pay | Admitting: Family Medicine

## 2020-11-16 VITALS — BP 139/76 | HR 63 | Temp 98.0°F | Ht 63.0 in | Wt 182.0 lb

## 2020-11-16 DIAGNOSIS — R0602 Shortness of breath: Secondary | ICD-10-CM | POA: Diagnosis not present

## 2020-11-16 DIAGNOSIS — I10 Essential (primary) hypertension: Secondary | ICD-10-CM | POA: Diagnosis not present

## 2020-11-16 DIAGNOSIS — E538 Deficiency of other specified B group vitamins: Secondary | ICD-10-CM | POA: Diagnosis not present

## 2020-11-16 DIAGNOSIS — F411 Generalized anxiety disorder: Secondary | ICD-10-CM

## 2020-11-16 DIAGNOSIS — Z9189 Other specified personal risk factors, not elsewhere classified: Secondary | ICD-10-CM

## 2020-11-16 DIAGNOSIS — E559 Vitamin D deficiency, unspecified: Secondary | ICD-10-CM | POA: Diagnosis not present

## 2020-11-16 DIAGNOSIS — Z6832 Body mass index (BMI) 32.0-32.9, adult: Secondary | ICD-10-CM

## 2020-11-16 DIAGNOSIS — F908 Attention-deficit hyperactivity disorder, other type: Secondary | ICD-10-CM

## 2020-11-16 DIAGNOSIS — G249 Dystonia, unspecified: Secondary | ICD-10-CM

## 2020-11-16 DIAGNOSIS — R5383 Other fatigue: Secondary | ICD-10-CM | POA: Diagnosis not present

## 2020-11-16 DIAGNOSIS — Z975 Presence of (intrauterine) contraceptive device: Secondary | ICD-10-CM

## 2020-11-16 DIAGNOSIS — R7301 Impaired fasting glucose: Secondary | ICD-10-CM

## 2020-11-16 DIAGNOSIS — F3289 Other specified depressive episodes: Secondary | ICD-10-CM

## 2020-11-16 DIAGNOSIS — Z0289 Encounter for other administrative examinations: Secondary | ICD-10-CM

## 2020-11-16 DIAGNOSIS — E669 Obesity, unspecified: Secondary | ICD-10-CM

## 2020-11-17 LAB — CBC WITH DIFFERENTIAL/PLATELET
Basophils Absolute: 0.1 10*3/uL (ref 0.0–0.2)
Basos: 1 %
EOS (ABSOLUTE): 0.1 10*3/uL (ref 0.0–0.4)
Eos: 1 %
Hemoglobin: 16 g/dL — ABNORMAL HIGH (ref 11.1–15.9)
Immature Grans (Abs): 0 10*3/uL (ref 0.0–0.1)
Immature Granulocytes: 0 %
Lymphocytes Absolute: 1.9 10*3/uL (ref 0.7–3.1)
Lymphs: 30 %
MCH: 34.3 pg — ABNORMAL HIGH (ref 26.6–33.0)
MCHC: 34.6 g/dL (ref 31.5–35.7)
MCV: 99 fL — ABNORMAL HIGH (ref 79–97)
Monocytes Absolute: 0.4 10*3/uL (ref 0.1–0.9)
Monocytes: 7 %
Neutrophils Absolute: 3.9 10*3/uL (ref 1.4–7.0)
Neutrophils: 61 %
Platelets: 272 10*3/uL (ref 150–450)
RBC: 4.67 x10E6/uL (ref 3.77–5.28)
RDW: 12.9 % (ref 11.7–15.4)
WBC: 6.3 10*3/uL (ref 3.4–10.8)

## 2020-11-17 LAB — COMPREHENSIVE METABOLIC PANEL
ALT: 13 IU/L (ref 0–32)
AST: 22 IU/L (ref 0–40)
Albumin/Globulin Ratio: 1.3 (ref 1.2–2.2)
Albumin: 4.3 g/dL (ref 3.8–4.9)
Alkaline Phosphatase: 99 IU/L (ref 44–121)
BUN/Creatinine Ratio: 16 (ref 9–23)
BUN: 15 mg/dL (ref 6–24)
Bilirubin Total: 0.6 mg/dL (ref 0.0–1.2)
CO2: 23 mmol/L (ref 20–29)
Calcium: 9.9 mg/dL (ref 8.7–10.2)
Chloride: 98 mmol/L (ref 96–106)
Creatinine, Ser: 0.96 mg/dL (ref 0.57–1.00)
GFR calc Af Amer: 77 mL/min/{1.73_m2} (ref >59)
GFR calc non Af Amer: 67 mL/min/{1.73_m2} (ref >59)
Globulin, Total: 3.2 g/dL (ref 1.5–4.5)
Glucose: 86 mg/dL (ref 65–99)
Potassium: 5 mmol/L (ref 3.5–5.2)
Sodium: 138 mmol/L (ref 134–144)
Total Protein: 7.5 g/dL (ref 6.0–8.5)

## 2020-11-17 LAB — ANEMIA PANEL
Ferritin: 449 ng/mL — ABNORMAL HIGH (ref 15–150)
Folate, Hemolysate: 303 ng/mL
Folate, RBC: 654 ng/mL (ref >498)
Hematocrit: 46.3 % (ref 34.0–46.6)
Iron Saturation: 68 % — ABNORMAL HIGH (ref 15–55)
Iron: 233 ug/dL — ABNORMAL HIGH (ref 27–159)
Retic Ct Pct: 2 % (ref 0.6–2.6)
Total Iron Binding Capacity: 344 ug/dL (ref 250–450)
UIBC: 111 ug/dL — ABNORMAL LOW (ref 131–425)
Vitamin B-12: 295 pg/mL (ref 232–1245)

## 2020-11-17 LAB — LIPID PANEL
Chol/HDL Ratio: 2.2 ratio (ref 0.0–4.4)
Cholesterol, Total: 228 mg/dL — ABNORMAL HIGH (ref 100–199)
HDL: 106 mg/dL (ref >39)
LDL Chol Calc (NIH): 107 mg/dL — ABNORMAL HIGH (ref 0–99)
Triglycerides: 88 mg/dL (ref 0–149)
VLDL Cholesterol Cal: 15 mg/dL (ref 5–40)

## 2020-11-17 LAB — VITAMIN D 25 HYDROXY (VIT D DEFICIENCY, FRACTURES): Vit D, 25-Hydroxy: 21.5 ng/mL — ABNORMAL LOW (ref 30.0–100.0)

## 2020-11-17 LAB — T4, FREE: Free T4: 0.99 ng/dL (ref 0.82–1.77)

## 2020-11-17 LAB — HEMOGLOBIN A1C
Est. average glucose Bld gHb Est-mCnc: 111 mg/dL
Hgb A1c MFr Bld: 5.5 % (ref 4.8–5.6)

## 2020-11-17 LAB — TSH: TSH: 2.5 u[IU]/mL (ref 0.450–4.500)

## 2020-11-17 LAB — INSULIN, RANDOM: INSULIN: 4.2 u[IU]/mL (ref 2.6–24.9)

## 2020-11-20 ENCOUNTER — Ambulatory Visit: Payer: BC Managed Care – PPO | Admitting: Neurology

## 2020-11-21 NOTE — Progress Notes (Signed)
Chief Complaint:   OBESITY Gina Washington (MR# [id number]) is a 55 y.o. female who presents for evaluation and treatment of obesity and related comorbidities. Current BMI is Body mass index is 32.24 kg/m. Gina Washington has been struggling with her weight for many years and has been unsuccessful in either losing weight, maintaining weight loss, or reaching her healthy weight goal.  Gina Washington is currently in the action stage of change and ready to dedicate time achieving and maintaining a healthier weight. Gina Washington is interested in becoming our patient and working on intensive lifestyle modifications including (but not limited to) diet and exercise for weight loss.  Gina Washington drinks 2 glasses of wine, 5 days per week.  Gina Washington habits were reviewed today and are as follows: Her family eats meals together, she thinks her family will eat healthier with her, her desired weight loss is 40 pounss, she started gaining excessive weight at age 54, her heaviest weight ever was 206 pounds, she craves carbs, she is frequently drinking liquids with calories, she has problems with excessive hunger, she frequently eats larger portions than normal and she struggles with emotional eating.  Depression Screen Gina Washington (modified PHQ-9) score was 14.  Depression screen Gina Washington 2/9 11/16/2020  Decreased Interest 3  Down, Depressed, Hopeless 1  PHQ - 2 Score 4  Altered sleeping 2  Tired, decreased energy 3  Change in appetite 2  Feeling bad or failure about yourself  1  Trouble concentrating 2  Moving slowly or fidgety/restless 0  Suicidal thoughts 0  PHQ-9 Score 14  Difficult doing work/chores Not difficult at all   Assessment/Plan:   1. Other fatigue Gina Washington denies daytime somnolence and denies waking up still tired.  Gina Washington generally gets 7 hours of sleep per night, and states that she has generally restful sleep. Snoring is not present. Apneic episodes are not present. Epworth Sleepiness Score is  4.  Gina Washington does feel that her weight is causing her energy to be lower than it should be. Fatigue may be related to obesity, depression or many other causes. Labs will be ordered, and in the meanwhile, Gina Washington will focus on self care including making healthy food choices, increasing physical activity and focusing on stress reduction.  - EKG 12-Lead - Anemia panel - CBC with Differential/Platelet - Comprehensive metabolic panel - Hemoglobin A1c - Insulin, random - Lipid panel - TSH - T4, free  2. SOB (shortness of breath) on exertion Gina Washington notes increasing shortness of breath with exercising and seems to be worsening over time with weight gain. She notes getting out of breath sooner with activity than she used to. This has gotten worse recently. Gina Washington denies shortness of breath at rest or orthopnea.  Gina Washington does feel that she gets out of breath more easily that she used to when she exercises. Gina Washington shortness of breath appears to be obesity related and exercise induced. She has agreed to work on weight loss and gradually increase exercise to treat her exercise induced shortness of breath. Will continue to monitor closely.  - EKG 12-Lead - Anemia panel - CBC with Differential/Platelet - Comprehensive metabolic panel - Hemoglobin A1c - Insulin, random - Lipid panel - TSH - T4, free  3. Essential hypertension At goal. Medications: Norvasc 5 mg daily, valsartan 160 mg daily, Toprol XL 100 mg daily.   Plan: Avoid buying foods that are: processed, frozen, or prepackaged to avoid excess salt. We will continue to monitor closely alongside her PCP and/or Specialist.  Regular  follow up with PCP and specialists was also encouraged.   BP Readings from Last 3 Encounters:  11/16/20 139/76  07/11/20 126/89  12/23/19 127/70   Lab Results  Component Value Date   CREATININE 0.96 11/16/2020   - CBC with Differential/Platelet - Comprehensive metabolic panel - Lipid panel - TSH -  T4, free  4. B12 deficiency Supplementation: Vitamin B12 1000 mcg/mL IM monthly.   Plan:  Continue current treatment.  Will check vitamin B12 level and anemia panel today.   - Anemia panel - CBC with Differential/Platelet  5. Vitamin D deficiency Optimal goal > 50 ng/dL.   Plan:  Will check vitamin D level today, as per below.  - VITAMIN D 25 Hydroxy (Vit-D Deficiency, Fractures)  6. Neurological movement disorder Diagnosed 10 years ago.  MS was ruled out.  She is followed by Neurology.  7. IUD (intrauterine device) in place Gina Washington has a Mirena in place.  8. Elevated fasting glucose Will check A1c and insulin level today, as per below.  - Hemoglobin A1c - Insulin, random  9. Attention deficit hyperactivity disorder (ADHD), other type Gina Washington is taking Vyvanse 50 mg daily.  Plan:  Continue Vyvanse at current dose.  10. GAD (generalized anxiety disorder) Behavior modification techniques were discussed today to help Gina Washington deal with her anxiety.    11. Other depression, with emotional eating Medication: Wellbutrin XL 150 mg daily.  Gina Washington tends to eat when stressed, bored, and for reward.  Depression is mild, recurrent.  She has a therapist and Psychiatric NP.  Status post EMDR.  Plan:  Continue medication and follow-up with specialists.  Behavior modification techniques were discussed today to help deal with emotional/non-hunger eating behaviors.  12. At risk for heart disease Due to Gina Washington current state of health and medical condition(s), she is at a higher risk for heart disease.  This puts the patient at much greater risk to subsequently develop cardiopulmonary conditions that can significantly affect patient's quality of life in a negative manner.    At least 10 minutes were spent on counseling Gina Washington about these concerns today, and I stressed the importance of reversing risks factors of obesity, especially truncal and visceral fat, hypertension,  hyperlipidemia, and pre-diabetes.  The initial goal is to lose at least 5-10% of starting weight to help reduce these risk factors.  Counseling:  Intensive lifestyle modifications were discussed with Gina Washington as the most appropriate first line of treatment.  she will continue to work on diet, exercise, and weight loss efforts.  We will continue to reassess these conditions on a fairly regular basis in an attempt to decrease the patient's overall morbidity and mortality.  Evidence-based interventions for health behavior change were utilized today including the discussion of self monitoring techniques, problem-solving barriers, and SMART goal setting techniques.  Specifically, regarding patient's less desirable eating habits and patterns, we employed the technique of small changes when Gina Washington has not been able to fully commit to her prudent nutritional plan.  13. Class 1 obesity with serious comorbidity and body mass index (BMI) of 32.0 to 32.9 in adult, unspecified obesity type  Gina Washington is currently in the action stage of change and her goal is to continue with weight loss efforts. I recommend Gina Washington begin the structured treatment plan as follows:  She has agreed to the Category 2 Plan +200 calories if needed.  Exercise goals: No exercise has been prescribed at this time.   Behavioral modification strategies: increasing lean protein intake, decreasing simple carbohydrates, increasing vegetables, increasing  water intake, decreasing liquid calories, decreasing alcohol intake, decreasing sodium intake and increasing high fiber foods.  She was informed of the importance of frequent follow-up visits to maximize her success with intensive lifestyle modifications for her multiple health conditions. She was informed we would discuss her lab results at her next visit unless there is a critical issue that needs to be addressed sooner. Gina Washington agreed to keep her next visit at the agreed upon time to discuss these  results.  Objective:   Blood pressure 139/76, pulse 63, temperature 98 F (36.7 C), temperature source Oral, height 5\' 3"  (1.6 m), weight 182 lb (82.6 kg), SpO2 96 %. Body mass index is 32.24 kg/m.  EKG: Normal sinus rhythm, rate 64 bpm.  Indirect Calorimeter completed today shows a VO2 of 273 and a REE of 1899.  Her calculated basal metabolic rate is 7628 thus her basal metabolic rate is better than expected.  General: Cooperative, alert, well developed, in no acute distress. HEENT: Conjunctivae and lids unremarkable. Cardiovascular: Regular rhythm.  Lungs: Normal work of breathing. Neurologic: No focal deficits.   Lab Results  Component Value Date   CREATININE 0.96 11/16/2020   BUN 15 11/16/2020   NA 138 11/16/2020   K 5.0 11/16/2020   CL 98 11/16/2020   CO2 23 11/16/2020   Lab Results  Component Value Date   ALT 13 11/16/2020   AST 22 11/16/2020   ALKPHOS 99 11/16/2020   BILITOT 0.6 11/16/2020   Lab Results  Component Value Date   HGBA1C 5.5 11/16/2020   Lab Results  Component Value Date   INSULIN 4.2 11/16/2020   Lab Results  Component Value Date   TSH 2.500 11/16/2020   Lab Results  Component Value Date   CHOL 228 (H) 11/16/2020   HDL 106 11/16/2020   LDLCALC 107 (H) 11/16/2020   TRIG 88 11/16/2020   CHOLHDL 2.2 11/16/2020   Lab Results  Component Value Date   WBC 6.3 11/16/2020   HGB 16.0 (H) 11/16/2020   HCT 46.3 11/16/2020   MCV 99 (H) 11/16/2020   PLT 272 11/16/2020   Lab Results  Component Value Date   IRON 233 (H) 11/16/2020   TIBC 344 11/16/2020   FERRITIN 449 (H) 11/16/2020   Attestation Statements:   This is the patient's first visit at [facility]. The patient's NEW PATIENT PACKET was reviewed at length. Included in the packet: current and past health history, medications, allergies, ROS, gynecologic history (women only), surgical history, family history, social history, weight history, weight loss surgery history  (for those that have had weight loss surgery), nutritional evaluation, [place] and food questionnaire, PHQ9, Epworth questionnaire, sleep habits questionnaire, patient life and health improvement goals questionnaire. These will all be scanned into the patient's chart under media.   During the visit, I independently reviewed the patient's EKG, bioimpedance scale results, and indirect calorimeter results. I used this information to tailor a meal plan for the patient that will help her to lose weight and will improve her obesity-related conditions going forward. I performed a medically necessary appropriate examination and/or evaluation. I discussed the assessment and treatment plan with the patient. The patient was provided an opportunity to ask questions and all were answered. The patient agreed with the plan and demonstrated an understanding of the instructions. Labs were ordered at this visit and will be reviewed at the next visit unless more critical results need to be addressed immediately. Clinical information was updated and documented in the  EMR.   I, Water quality scientist, CMA, am acting as transcriptionist for Briscoe Deutscher, DO  I have reviewed the above documentation for accuracy and completeness, and I agree with the above. Briscoe Deutscher, DO

## 2020-11-30 ENCOUNTER — Encounter (INDEPENDENT_AMBULATORY_CARE_PROVIDER_SITE_OTHER): Payer: Self-pay | Admitting: Family Medicine

## 2020-11-30 ENCOUNTER — Ambulatory Visit (INDEPENDENT_AMBULATORY_CARE_PROVIDER_SITE_OTHER): Payer: BC Managed Care – PPO | Admitting: Family Medicine

## 2020-11-30 ENCOUNTER — Other Ambulatory Visit: Payer: Self-pay

## 2020-11-30 VITALS — BP 118/81 | HR 59 | Temp 98.0°F | Ht 63.0 in | Wt 183.0 lb

## 2020-11-30 DIAGNOSIS — I10 Essential (primary) hypertension: Secondary | ICD-10-CM

## 2020-11-30 DIAGNOSIS — Z6832 Body mass index (BMI) 32.0-32.9, adult: Secondary | ICD-10-CM

## 2020-11-30 DIAGNOSIS — E66811 Obesity, class 1: Secondary | ICD-10-CM

## 2020-11-30 DIAGNOSIS — E669 Obesity, unspecified: Secondary | ICD-10-CM

## 2020-11-30 DIAGNOSIS — E538 Deficiency of other specified B group vitamins: Secondary | ICD-10-CM | POA: Diagnosis not present

## 2020-11-30 DIAGNOSIS — E559 Vitamin D deficiency, unspecified: Secondary | ICD-10-CM | POA: Diagnosis not present

## 2020-11-30 DIAGNOSIS — F908 Attention-deficit hyperactivity disorder, other type: Secondary | ICD-10-CM

## 2020-11-30 DIAGNOSIS — Z9189 Other specified personal risk factors, not elsewhere classified: Secondary | ICD-10-CM

## 2020-11-30 DIAGNOSIS — F3289 Other specified depressive episodes: Secondary | ICD-10-CM

## 2020-12-05 NOTE — Progress Notes (Signed)
Chief Complaint:   OBESITY Gina Washington is here to discuss her progress with her obesity treatment plan along with follow-up of her obesity related diagnoses.   Today's visit was #: 2 Starting weight: 182 lbs Starting date: 11/16/2020 Today's weight: 183 lbs Today's date: 11/30/2020 Total lbs lost to date: +1 lb Body mass index is 32.42 kg/m.   Interim History:  Gina Washington says there is too much food on the plan.  Today's bioimpedance results indicate that Gina Washington has gained 3.5 pounds of water weight since her last visit.  She has decreased to 1/2 of what she was previously drinking.  Nutrition Plan: Category 2 Plan +200 calories for 99% of the time. Activity: None at this time.  Assessment/Plan:   1. Vitamin D deficiency Discussed labs with patient today.  Not at goal. Current vitamin D is 21.5, tested on 11/16/2020. Optimal goal > 50 ng/dL. She started taking vitamin D 50,000 IU weekly yesterday.  Plan: Continue to take prescription Vitamin D @50 ,000 IU every week as prescribed.  Follow-up for routine testing of Vitamin D, at least 2-3 times per year to avoid over-replacement.  2. B12 deficiency Lab Results  Component Value Date   VITAMINB12 295 11/16/2020   Discussed labs with patient today.  Supplementation:  Vitamin B12 1000 mcg/mL IM monthly.   Plan:  Will continue to monitor.  3. Essential hypertension Discussed labs with patient today.  At goal.  Medications: Norvasc 5 mg daily, metoprolol 100 mg daily, Diovan 150 mg daily.   Plan:  Avoid buying foods that are: processed, frozen, or prepackaged to avoid excess salt. We will continue to monitor closely alongside her PCP and/or Specialist.  Regular follow up with PCP and specialists was also encouraged.   BP Readings from Last 3 Encounters:  11/30/20 118/81  11/16/20 139/76  07/11/20 126/89   Lab Results  Component Value Date   CREATININE 0.96 11/16/2020   4. Attention deficit hyperactivity disorder (ADHD)  Gina Washington  is taking Vyvanse 50 mg daily.  She is followed by her PCP. We will continue to monitor symptoms as they relate to her weight loss journey.  5. Other depression, with emotional eating Medication: Wellbutrin XL 150 mg daily.  Plan:  Continue Wellbutrin.  Behavior modification techniques were discussed today to help deal with emotional/non-hunger eating behaviors.  6. At risk for heart disease Due to Gina Washington's current state of health and medical condition(s), she is at a higher risk for heart disease.  This puts the patient at much greater risk to subsequently develop cardiopulmonary conditions that can significantly affect patient's quality of life in a negative manner.    At least 9 minutes were spent on counseling Gina Washington about these concerns today. Counseling:  Intensive lifestyle modifications were discussed with Gina Washington as the most appropriate first line of treatment.  she will continue to work on diet, exercise, and weight loss efforts.  We will continue to reassess these conditions on a fairly regular basis in an attempt to decrease the patient's overall morbidity and mortality.  Evidence-based interventions for health behavior change were utilized today including the discussion of self monitoring techniques, problem-solving barriers, and SMART goal setting techniques.  Specifically, regarding patient's less desirable eating habits and patterns, we employed the technique of small changes when Gina Washington has not been able to fully commit to her prudent nutritional plan.  7. Class 1 obesity with serious comorbidity and body mass index (BMI) of 32.0 to 32.9 in adult, unspecified obesity type  Course:  Gina Washington is currently in the action stage of change. As such, her goal is to continue with weight loss efforts.   Nutrition goals: She has agreed to the Category 2 Plan +200 calories.   Exercise goals: No exercise has been prescribed at this time.  Behavioral modification strategies: increasing lean  protein intake, decreasing simple carbohydrates, increasing vegetables and increasing water intake.  Gina Washington has agreed to follow-up with our clinic in 2 weeks. She was informed of the importance of frequent follow-up visits to maximize her success with intensive lifestyle modifications for her multiple health conditions.   Objective:   Blood pressure 118/81, pulse (!) 59, temperature 98 F (36.7 C), temperature source Oral, height 5\' 3"  (1.6 m), weight 183 lb (83 kg), SpO2 96 %. Body mass index is 32.42 kg/m.  General: Cooperative, alert, well developed, in no acute distress. HEENT: Conjunctivae and lids unremarkable. Cardiovascular: Regular rhythm.  Lungs: Normal work of breathing. Neurologic: No focal deficits.   Lab Results  Component Value Date   CREATININE 0.96 11/16/2020   BUN 15 11/16/2020   NA 138 11/16/2020   K 5.0 11/16/2020   CL 98 11/16/2020   CO2 23 11/16/2020   Lab Results  Component Value Date   ALT 13 11/16/2020   AST 22 11/16/2020   ALKPHOS 99 11/16/2020   BILITOT 0.6 11/16/2020   Lab Results  Component Value Date   HGBA1C 5.5 11/16/2020   Lab Results  Component Value Date   INSULIN 4.2 11/16/2020   Lab Results  Component Value Date   TSH 2.500 11/16/2020   Lab Results  Component Value Date   CHOL 228 (H) 11/16/2020   HDL 106 11/16/2020   LDLCALC 107 (H) 11/16/2020   TRIG 88 11/16/2020   CHOLHDL 2.2 11/16/2020   Lab Results  Component Value Date   WBC 6.3 11/16/2020   HGB 16.0 (H) 11/16/2020   HCT 46.3 11/16/2020   MCV 99 (H) 11/16/2020   PLT 272 11/16/2020   Lab Results  Component Value Date   IRON 233 (H) 11/16/2020   TIBC 344 11/16/2020   FERRITIN 449 (H) 11/16/2020   Attestation Statements:   Reviewed by clinician on day of visit: allergies, medications, problem list, medical history, surgical history, family history, social history, and previous encounter notes.  I, Water quality scientist, CMA, am acting as transcriptionist for Briscoe Deutscher, DO  I have reviewed the above documentation for accuracy and completeness, and I agree with the above. Briscoe Deutscher, DO

## 2020-12-07 ENCOUNTER — Telehealth (INDEPENDENT_AMBULATORY_CARE_PROVIDER_SITE_OTHER): Payer: BC Managed Care – PPO | Admitting: Psychiatry

## 2020-12-07 ENCOUNTER — Other Ambulatory Visit: Payer: Self-pay

## 2020-12-07 ENCOUNTER — Encounter: Payer: Self-pay | Admitting: Psychiatry

## 2020-12-07 ENCOUNTER — Encounter (HOSPITAL_COMMUNITY): Payer: BC Managed Care – PPO

## 2020-12-07 ENCOUNTER — Ambulatory Visit (HOSPITAL_COMMUNITY)
Admission: RE | Admit: 2020-12-07 | Discharge: 2020-12-07 | Disposition: A | Discharge status: Home / Self Care | Payer: BC Managed Care – PPO | Source: Ambulatory Visit | Attending: Neurology | Admitting: Neurology

## 2020-12-07 DIAGNOSIS — F330 Major depressive disorder, recurrent, mild: Secondary | ICD-10-CM | POA: Diagnosis not present

## 2020-12-07 DIAGNOSIS — F902 Attention-deficit hyperactivity disorder, combined type: Secondary | ICD-10-CM | POA: Diagnosis not present

## 2020-12-07 DIAGNOSIS — E538 Deficiency of other specified B group vitamins: Secondary | ICD-10-CM

## 2020-12-07 DIAGNOSIS — R29898 Other symptoms and signs involving the musculoskeletal system: Secondary | ICD-10-CM

## 2020-12-07 MED ORDER — LISDEXAMFETAMINE DIMESYLATE 50 MG PO CAPS: 50.0000 mg | ORAL_CAPSULE | Freq: Every day | ORAL | 0 refills | Status: DC

## 2020-12-07 MED ORDER — BUPROPION HCL ER (XL) 150 MG PO TB24
150.0000 mg | ORAL_TABLET | Freq: Every day | ORAL | 1 refills | Status: DC
Start: 2020-12-07 — End: 2021-05-22
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
## 2020-12-14 ENCOUNTER — Encounter (HOSPITAL_COMMUNITY): Payer: BC Managed Care – PPO

## 2020-12-14 ENCOUNTER — Encounter (HOSPITAL_COMMUNITY): Payer: Self-pay

## 2020-12-14 ENCOUNTER — Ambulatory Visit (HOSPITAL_COMMUNITY): Admission: RE | Admit: 2020-12-14 | Payer: BC Managed Care – PPO | Source: Ambulatory Visit

## 2020-12-18 ENCOUNTER — Other Ambulatory Visit: Payer: Self-pay

## 2020-12-18 ENCOUNTER — Ambulatory Visit (INDEPENDENT_AMBULATORY_CARE_PROVIDER_SITE_OTHER): Payer: BC Managed Care – PPO | Admitting: Family Medicine

## 2020-12-18 ENCOUNTER — Encounter (INDEPENDENT_AMBULATORY_CARE_PROVIDER_SITE_OTHER): Payer: Self-pay | Admitting: Family Medicine

## 2020-12-18 VITALS — BP 108/75 | HR 74 | Temp 97.9°F | Ht 63.0 in | Wt 178.0 lb

## 2020-12-18 DIAGNOSIS — E559 Vitamin D deficiency, unspecified: Secondary | ICD-10-CM

## 2020-12-18 DIAGNOSIS — E669 Obesity, unspecified: Secondary | ICD-10-CM | POA: Diagnosis not present

## 2020-12-18 DIAGNOSIS — Z6831 Body mass index (BMI) 31.0-31.9, adult: Secondary | ICD-10-CM

## 2020-12-19 ENCOUNTER — Encounter (INDEPENDENT_AMBULATORY_CARE_PROVIDER_SITE_OTHER): Payer: Self-pay | Admitting: Family Medicine

## 2020-12-19 DIAGNOSIS — E669 Obesity, unspecified: Secondary | ICD-10-CM | POA: Insufficient documentation

## 2020-12-19 DIAGNOSIS — E559 Vitamin D deficiency, unspecified: Secondary | ICD-10-CM | POA: Insufficient documentation

## 2020-12-19 NOTE — Progress Notes (Signed)
Chief Complaint:   OBESITY Gina Washington is here to discuss her progress with her obesity treatment plan along with follow-up of her obesity related diagnoses. Gina Washington is on the Category 1 Plan and states she is following her eating plan approximately 90% of the time. Gina Washington states she is not exercising regularly at this time.  Today's visit was #: 3 Starting weight: 182 lbs Starting date: 11/16/2020 Today's weight: 178 lbs Today's date: 12/18/2020 Total lbs lost to date: 4 lbs Total lbs lost since last in-office visit: 5 lbs  Interim History: Gina Washington was switched at last office visit to Category 1 from Category 2.  She felt Category 2 was too much food.  She likes to cook and would like some recipes.  Hunger is satisfied.  She does well with water intake.  Subjective:   1. Vitamin D deficiency Vitamin D low at 21.5.  On weekly prescription vitamin D.  Assessment/Plan:   1. Vitamin D deficiency Continue prescription vitamin D.  2. Class 1 obesity with serious comorbidity and body mass index (BMI) of 31.0 to 31.9 in adult, unspecified obesity type  Gina Washington is currently in the action stage of change. As such, her goal is to continue with weight loss efforts. She has agreed to the Category 1 Plan and keeping a food journal and adhering to recommended goals of 400-500 calories and 35 grams of protein at supper.   Exercise goals: No exercise has been prescribed at this time.  Behavioral modification strategies: meal planning and cooking strategies.  Handouts given:  Lunch and Breakfast Options, Recipes.  Gina Washington has agreed to follow-up with our clinic in 2 weeks with Dr. Juleen China and in 4 weeks with Jake Bathe, [place].   Objective:   Blood pressure 108/75, pulse 74, temperature 97.9 F (36.6 C), height 5\' 3"  (1.6 m), weight 178 lb (80.7 kg), SpO2 99 %. Body mass index is 31.53 kg/m.  General: Cooperative, alert, well developed, in no acute distress. HEENT: Conjunctivae and lids  unremarkable. Cardiovascular: Regular rhythm.  Lungs: Normal work of breathing. Neurologic: No focal deficits.   Lab Results  Component Value Date   CREATININE 0.96 11/16/2020   BUN 15 11/16/2020   NA 138 11/16/2020   K 5.0 11/16/2020   CL 98 11/16/2020   CO2 23 11/16/2020   Lab Results  Component Value Date   ALT 13 11/16/2020   AST 22 11/16/2020   ALKPHOS 99 11/16/2020   BILITOT 0.6 11/16/2020   Lab Results  Component Value Date   HGBA1C 5.5 11/16/2020   Lab Results  Component Value Date   INSULIN 4.2 11/16/2020   Lab Results  Component Value Date   TSH 2.500 11/16/2020   Lab Results  Component Value Date   CHOL 228 (H) 11/16/2020   HDL 106 11/16/2020   LDLCALC 107 (H) 11/16/2020   TRIG 88 11/16/2020   CHOLHDL 2.2 11/16/2020   Lab Results  Component Value Date   WBC 6.3 11/16/2020   HGB 16.0 (H) 11/16/2020   HCT 46.3 11/16/2020   MCV 99 (H) 11/16/2020   PLT 272 11/16/2020   Lab Results  Component Value Date   IRON 233 (H) 11/16/2020   TIBC 344 11/16/2020   FERRITIN 449 (H) 11/16/2020   Attestation Statements:   Reviewed by clinician on day of visit: allergies, medications, problem list, medical history, surgical history, family history, social history, and previous encounter notes.  I, Water quality scientist, CMA, am acting as Location manager for [employer], [place].  I have reviewed the above documentation for accuracy and completeness, and I agree with the above. -  Georgianne Fick, FNP

## 2021-01-03 ENCOUNTER — Ambulatory Visit (INDEPENDENT_AMBULATORY_CARE_PROVIDER_SITE_OTHER): Payer: BC Managed Care – PPO | Admitting: Family Medicine

## 2021-01-04 ENCOUNTER — Encounter (INDEPENDENT_AMBULATORY_CARE_PROVIDER_SITE_OTHER): Payer: Self-pay

## 2021-01-04 ENCOUNTER — Ambulatory Visit (INDEPENDENT_AMBULATORY_CARE_PROVIDER_SITE_OTHER): Payer: BC Managed Care – PPO | Admitting: Bariatrics

## 2021-01-04 ENCOUNTER — Encounter (INDEPENDENT_AMBULATORY_CARE_PROVIDER_SITE_OTHER): Payer: Self-pay | Admitting: Bariatrics

## 2021-01-04 ENCOUNTER — Other Ambulatory Visit: Payer: Self-pay

## 2021-01-04 VITALS — BP 134/84 | HR 72 | Temp 97.7°F | Ht 63.0 in | Wt 180.0 lb

## 2021-01-04 DIAGNOSIS — E559 Vitamin D deficiency, unspecified: Secondary | ICD-10-CM

## 2021-01-04 DIAGNOSIS — E669 Obesity, unspecified: Secondary | ICD-10-CM

## 2021-01-04 DIAGNOSIS — I10 Essential (primary) hypertension: Secondary | ICD-10-CM | POA: Diagnosis not present

## 2021-01-04 DIAGNOSIS — Z6831 Body mass index (BMI) 31.0-31.9, adult: Secondary | ICD-10-CM | POA: Diagnosis not present

## 2021-01-10 ENCOUNTER — Other Ambulatory Visit (HOSPITAL_COMMUNITY)
Admission: RE | Admit: 2021-01-10 | Discharge: 2021-01-10 | Disposition: A | Discharge status: Home / Self Care | Payer: BC Managed Care – PPO | Source: Ambulatory Visit | Attending: Obstetrics & Gynecology | Admitting: Obstetrics & Gynecology

## 2021-01-10 ENCOUNTER — Encounter (INDEPENDENT_AMBULATORY_CARE_PROVIDER_SITE_OTHER): Payer: Self-pay | Admitting: Bariatrics

## 2021-01-10 ENCOUNTER — Ambulatory Visit (INDEPENDENT_AMBULATORY_CARE_PROVIDER_SITE_OTHER): Payer: BC Managed Care – PPO | Admitting: Obstetrics & Gynecology

## 2021-01-10 ENCOUNTER — Other Ambulatory Visit: Payer: Self-pay

## 2021-01-10 ENCOUNTER — Encounter: Payer: Self-pay | Admitting: Obstetrics & Gynecology

## 2021-01-10 VITALS — BP 126/81 | HR 69 | Ht 63.5 in | Wt 182.0 lb

## 2021-01-10 DIAGNOSIS — Z01419 Encounter for gynecological examination (general) (routine) without abnormal findings: Secondary | ICD-10-CM | POA: Diagnosis not present

## 2021-01-10 DIAGNOSIS — T8332XA Displacement of intrauterine contraceptive device, initial encounter: Secondary | ICD-10-CM

## 2021-01-10 DIAGNOSIS — Z1231 Encounter for screening mammogram for malignant neoplasm of breast: Secondary | ICD-10-CM

## 2021-01-10 MED ORDER — MISOPROSTOL 200 MCG PO TABS: ORAL_TABLET | ORAL | 0 refills | Status: DC

## 2021-01-10 MED ORDER — DIAZEPAM 5 MG PO TABS: ORAL_TABLET | ORAL | 0 refills | Status: DC

## 2021-01-10 NOTE — Progress Notes (Signed)
Patient states it has been six years since her IUD was placed. Would like it taken out and not replaced. Kathrene Alu RN

## 2021-01-10 NOTE — Progress Notes (Signed)
Chief Complaint:   OBESITY Gina Washington is here to discuss her progress with her obesity treatment plan along with follow-up of her obesity related diagnoses. Gina Washington is on the Category 1 Plan and keeping a food journal and adhering to recommended goals of 400-500 calories and 35 grams of protein at supper daily and states she is following her eating plan approximately 65% of the time. Gina Washington states she is doing 0 minutes 0 times per week.  Today's visit was #: 4 Starting weight: 182 lbs Starting date: 11/16/2020 Today's weight: 180 lbs Today's date: 01/04/2021 Total lbs lost to date: 2 Total lbs lost since last in-office visit: 0  Interim History: Gina Washington is up 2 lbs since her last visit. She has struggled over the last week. Her appetite has increased. She is up 4 lbs in water per our bioimpedance scale.  Subjective:   1. Essential hypertension Gina Washington is taking Norvasc, and her blood pressure is reasonably controlled.  2. Vitamin D deficiency Gina Washington is taking Vit D currently.  Assessment/Plan:   1. Essential hypertension Gina Washington will continue Norvasc, and will continue working on healthy weight loss and exercise to improve blood pressure control. We will watch for signs of hypotension as she continues her lifestyle modifications.   2. Vitamin D deficiency Low Vitamin D level contributes to fatigue and are associated with obesity, breast, and colon cancer. Gina Washington agreed to continue taking prescription Vitamin D 50,000 IU every week and will follow-up for routine testing of Vitamin D, at least 2-3 times per year to avoid over-replacement.  3. Class 1 obesity with serious comorbidity and body mass index (BMI) of 31.0 to 31.9 in adult, unspecified obesity type Gina Washington is currently in the action stage of change. As such, her goal is to continue with weight loss efforts. She has agreed to the Category 1 Plan.   Mindful eating was discussed. She may eat raw vegetables. Breakfast,  Lunch, and Dinner options were given.  Exercise goals: No exercise has been prescribed at this time.  Behavioral modification strategies: increasing lean protein intake, decreasing simple carbohydrates, increasing vegetables, increasing water intake, decreasing eating out, no skipping meals, meal planning and cooking strategies, keeping healthy foods in the home and planning for success.  Gina Washington has agreed to follow-up with our clinic in 2 weeks with Dr. Juleen China. She was informed of the importance of frequent follow-up visits to maximize her success with intensive lifestyle modifications for her multiple health conditions.   Objective:   Blood pressure 134/84, pulse 72, temperature 97.7 F (36.5 C), height 5\' 3"  (1.6 m), weight 180 lb (81.6 kg), SpO2 97 %. Body mass index is 31.89 kg/m.  General: Cooperative, alert, well developed, in no acute distress. HEENT: Conjunctivae and lids unremarkable. Cardiovascular: Regular rhythm.  Lungs: Normal work of breathing. Neurologic: No focal deficits.   Lab Results  Component Value Date   CREATININE 0.96 11/16/2020   BUN 15 11/16/2020   NA 138 11/16/2020   K 5.0 11/16/2020   CL 98 11/16/2020   CO2 23 11/16/2020   Lab Results  Component Value Date   ALT 13 11/16/2020   AST 22 11/16/2020   ALKPHOS 99 11/16/2020   BILITOT 0.6 11/16/2020   Lab Results  Component Value Date   HGBA1C 5.5 11/16/2020   Lab Results  Component Value Date   INSULIN 4.2 11/16/2020   Lab Results  Component Value Date   TSH 2.500 11/16/2020   Lab Results  Component Value Date  CHOL 228 (H) 11/16/2020   HDL 106 11/16/2020   LDLCALC 107 (H) 11/16/2020   TRIG 88 11/16/2020   CHOLHDL 2.2 11/16/2020   Lab Results  Component Value Date   WBC 6.3 11/16/2020   HGB 16.0 (H) 11/16/2020   HCT 46.3 11/16/2020   MCV 99 (H) 11/16/2020   PLT 272 11/16/2020   Lab Results  Component Value Date   IRON 233 (H) 11/16/2020   TIBC 344 11/16/2020   FERRITIN  449 (H) 11/16/2020   Attestation Statements:   Reviewed by clinician on day of visit: allergies, medications, problem list, medical history, surgical history, family history, social history, and previous encounter notes.  Time spent on visit including pre-visit chart review and post-visit care and charting was 20 minutes.    Wilhemena Durie, am acting as Location manager for [employer], [place].  I have reviewed the above documentation for accuracy and completeness, and I agree with the above. Jearld Lesch, DO

## 2021-01-10 NOTE — Progress Notes (Signed)
Subjective:     Gina Washington is a 55 y.o. female here for a routine exam.  Current complaints: none. Pt has had ID for 6 years. She had only 1 episode of very light bleeding in the year. She has no menopausal sx. Would like to have the IDU removed.    Gynecologic History No LMP recorded. (Menstrual status: IUD). Contraception: post menopausal status; IUD Last Pap: 12/23/2019 Results were: abnormal: ASCUS with neg hrHPV  Last mammogram: 01/04/2020. Results were: normal  Obstetric History OB History  Gravida Para Term Preterm AB Living  1 1 1  0 0 0  SAB IAB Ectopic Multiple Live Births  0 0 0 0 1    # Outcome Date GA Lbr Len/2nd Weight Sex Delivery Anes PTL Lv  1 Term      CS-Unspec        The following portions of the patient's history were reviewed and updated as appropriate: allergies, current medications, past family history, past medical history, past social history, past surgical history and problem list.  Review of Systems Pertinent items are noted in HPI.    Objective:  BP 126/81   Pulse 69   Ht 5' 3.5" (1.613 m)   Wt 182 lb (82.6 kg)   BMI 31.73 kg/m  General Appearance:    Alert, cooperative, no distress, appears stated age  Head:    Normocephalic, without obvious abnormality, atraumatic  Eyes:    conjunctiva/corneas clear, EOM's intact, both eyes  Ears:    Normal external ear canals, both ears  Nose:   Nares normal, septum midline, mucosa normal, no drainage    or sinus tenderness  Throat:   Lips, mucosa, and tongue normal; teeth and gums normal  Neck:   Supple, symmetrical, trachea midline, no adenopathy;    thyroid:  no enlargement/tenderness/nodules  Back:     Symmetric, no curvature, ROM normal, no CVA tenderness  Lungs:     respirations unlabored  Chest Wall:    No tenderness or deformity   Heart:    Regular rate and rhythm  Breast Exam:    No tenderness, masses, or nipple abnormality  Abdomen:     Soft, non-tender, bowel sounds active all four quadrants,     no masses, no organomegaly  Genitalia:    Normal female without lesion, discharge or tenderness   IUD strings not seen.   Extremities:   Extremities normal, atraumatic, no cyanosis or edema  Pulses:   2+ and symmetric all extremities  Skin:   Skin color, texture, turgor normal, no rashes or lesions   IUD removal (failed) Patient was in the dorsal lithotomy position, normal external genitalia was noted.  A speculum was placed in the patient's vagina, normal discharge was noted, no lesions. The multiparous cervix was visualized, no lesions, no abnormal discharge;  and the cervix was swabbed with Betadine using scopettes. The strings of the IUD were not visualized, so Kelly forceps were introduced into the endocervical cavity/ Several attempts were made to enter the endocervical cavity with no success. The pt was in pain and could not tolerate the procedure so the removal was aborted.   Assessment:    Healthy female exam.   Lost IUD strings. Failed attempts at removal   Menopausal state   Plan:   F/u PAP schedule mammogram Schedule hysteroscopic removal of IUD   [place] L. Harraway-Smith, M.D., Cherlynn June

## 2021-01-12 ENCOUNTER — Ambulatory Visit (HOSPITAL_BASED_OUTPATIENT_CLINIC_OR_DEPARTMENT_OTHER): Payer: BC Managed Care – PPO

## 2021-01-12 LAB — CYTOLOGY - PAP
Comment: NEGATIVE
Diagnosis: NEGATIVE
High risk HPV: NEGATIVE

## 2021-01-15 ENCOUNTER — Telehealth (INDEPENDENT_AMBULATORY_CARE_PROVIDER_SITE_OTHER): Payer: BC Managed Care – PPO | Admitting: Family Medicine

## 2021-01-15 ENCOUNTER — Encounter (INDEPENDENT_AMBULATORY_CARE_PROVIDER_SITE_OTHER): Payer: Self-pay | Admitting: Family Medicine

## 2021-01-15 ENCOUNTER — Other Ambulatory Visit: Payer: Self-pay

## 2021-01-15 DIAGNOSIS — R632 Polyphagia: Secondary | ICD-10-CM | POA: Diagnosis not present

## 2021-01-15 DIAGNOSIS — E559 Vitamin D deficiency, unspecified: Secondary | ICD-10-CM

## 2021-01-15 DIAGNOSIS — Z6832 Body mass index (BMI) 32.0-32.9, adult: Secondary | ICD-10-CM | POA: Diagnosis not present

## 2021-01-15 DIAGNOSIS — E669 Obesity, unspecified: Secondary | ICD-10-CM | POA: Diagnosis not present

## 2021-01-15 MED ORDER — WEGOVY 0.25 MG/0.5ML ~~LOC~~ SOAJ: 0.2500 mg | SUBCUTANEOUS | 0 refills | Status: DC

## 2021-01-16 ENCOUNTER — Encounter (INDEPENDENT_AMBULATORY_CARE_PROVIDER_SITE_OTHER): Payer: Self-pay

## 2021-01-17 NOTE — Progress Notes (Signed)
TeleHealth Visit:  Due to the COVID-19 pandemic, this visit was completed with telemedicine (audio/video) technology to reduce patient and provider exposure as well as to preserve personal protective equipment.   Gina Washington has verbally consented to this TeleHealth visit. The patient is located at home, the provider is located at the [facility] office. The participants in this visit include the listed provider and patient. The visit was conducted today via MyChart video.  Chief Complaint: OBESITY Gina Washington is here to discuss her progress with her obesity treatment plan along with follow-up of her obesity related diagnoses. Gina Washington is on the Category 1 Plan. Gina Washington states she is not exercising regularly at this time.  Today's visit was #: 5 Starting weight: 182 lbs Starting date: 11/16/2020  Interim History: Gina Washington says she has maintained her weight since last office visit.  Weight today is 180 pounds.  She feels her weight is plateaued.  She notes snacking between meals.  She feels she does not always eat all of the protein.  She feels somewhat bored with the food.  Subjective:   1. Vitamin D deficiency Vitamin D low at 21.5.  On weekly prescription vitamin D.  2. Polyphagia Notes cravings and polyphagia.  Assessment/Plan:   1. Vitamin D deficiency Continue prescription vitamin D.  2. Polyphagia New prescription provided for Wegovy 0.25 mg subcutaneously weekly.  3. Obesity: BMI 31  - Start Semaglutide-Weight Management (WEGOVY) 0.25 MG/0.5ML SOAJ; Inject 0.25 mg into the skin once a week.  Dispense: 2 mL; Refill: 0  Gina Washington is currently in the action stage of change. As such, her goal is to continue with weight loss efforts. She has agreed to the Category 1 Plan and keeping a food journal and adhering to recommended goals of 300-450 calories and 35 grams of protein at supper.   Handout provided via MyChart:  Recipes.  Information about My Fitness Pal provided as  well.  Exercise goals: No exercise has been prescribed at this time.  Behavioral modification strategies: increasing lean protein intake and meal planning and cooking strategies.  Gina Washington has agreed to follow-up with our clinic in 3 weeks.   Objective:   VITALS: Per patient if applicable, see vitals. GENERAL: Alert and in no acute distress. CARDIOPULMONARY: No increased WOB. Speaking in clear sentences.  PSYCH: Pleasant and cooperative. Speech normal rate and rhythm. Affect is appropriate. Insight and judgement are appropriate. Attention is focused, linear, and appropriate.  NEURO: Oriented as arrived to appointment on time with no prompting.   Lab Results  Component Value Date   CREATININE 0.96 11/16/2020   BUN 15 11/16/2020   NA 138 11/16/2020   K 5.0 11/16/2020   CL 98 11/16/2020   CO2 23 11/16/2020   Lab Results  Component Value Date   ALT 13 11/16/2020   AST 22 11/16/2020   ALKPHOS 99 11/16/2020   BILITOT 0.6 11/16/2020   Lab Results  Component Value Date   HGBA1C 5.5 11/16/2020   Lab Results  Component Value Date   INSULIN 4.2 11/16/2020   Lab Results  Component Value Date   TSH 2.500 11/16/2020   Lab Results  Component Value Date   CHOL 228 (H) 11/16/2020   HDL 106 11/16/2020   LDLCALC 107 (H) 11/16/2020   TRIG 88 11/16/2020   CHOLHDL 2.2 11/16/2020   Lab Results  Component Value Date   WBC 6.3 11/16/2020   HGB 16.0 (H) 11/16/2020   HCT 46.3 11/16/2020   MCV 99 (H)  11/16/2020   PLT 272 11/16/2020   Lab Results  Component Value Date   IRON 233 (H) 11/16/2020   TIBC 344 11/16/2020   FERRITIN 449 (H) 11/16/2020   Attestation Statements:   Reviewed by clinician on day of visit: allergies, medications, problem list, medical history, surgical history, family history, social history, and previous encounter notes.  I, Water quality scientist, CMA, am acting as Location manager for [employer], [place].  I have reviewed the above documentation for accuracy and  completeness, and I agree with the above. - Georgianne Fick, FNP

## 2021-01-18 ENCOUNTER — Encounter (HOSPITAL_BASED_OUTPATIENT_CLINIC_OR_DEPARTMENT_OTHER): Payer: Self-pay

## 2021-01-18 ENCOUNTER — Ambulatory Visit (HOSPITAL_BASED_OUTPATIENT_CLINIC_OR_DEPARTMENT_OTHER)
Admission: RE | Admit: 2021-01-18 | Discharge: 2021-01-18 | Disposition: A | Discharge status: Home / Self Care | Payer: BC Managed Care – PPO | Source: Ambulatory Visit | Attending: Obstetrics & Gynecology | Admitting: Obstetrics & Gynecology

## 2021-01-18 ENCOUNTER — Other Ambulatory Visit: Payer: Self-pay

## 2021-01-18 DIAGNOSIS — Z1231 Encounter for screening mammogram for malignant neoplasm of breast: Secondary | ICD-10-CM | POA: Diagnosis present

## 2021-02-06 ENCOUNTER — Ambulatory Visit (INDEPENDENT_AMBULATORY_CARE_PROVIDER_SITE_OTHER): Payer: BC Managed Care – PPO | Admitting: Family Medicine

## 2021-02-06 ENCOUNTER — Encounter (INDEPENDENT_AMBULATORY_CARE_PROVIDER_SITE_OTHER): Payer: Self-pay | Admitting: Family Medicine

## 2021-02-06 ENCOUNTER — Other Ambulatory Visit: Payer: Self-pay

## 2021-02-06 VITALS — BP 101/72 | HR 84 | Temp 97.9°F | Ht 63.0 in | Wt 176.0 lb

## 2021-02-06 DIAGNOSIS — E559 Vitamin D deficiency, unspecified: Secondary | ICD-10-CM

## 2021-02-06 DIAGNOSIS — E669 Obesity, unspecified: Secondary | ICD-10-CM

## 2021-02-06 DIAGNOSIS — Z9189 Other specified personal risk factors, not elsewhere classified: Secondary | ICD-10-CM

## 2021-02-06 DIAGNOSIS — Z6832 Body mass index (BMI) 32.0-32.9, adult: Secondary | ICD-10-CM | POA: Diagnosis not present

## 2021-02-06 DIAGNOSIS — R632 Polyphagia: Secondary | ICD-10-CM | POA: Diagnosis not present

## 2021-02-06 MED ORDER — WEGOVY 0.25 MG/0.5ML ~~LOC~~ SOAJ: 0.2500 mg | SUBCUTANEOUS | 0 refills | Status: DC

## 2021-02-07 ENCOUNTER — Encounter (INDEPENDENT_AMBULATORY_CARE_PROVIDER_SITE_OTHER): Payer: Self-pay | Admitting: Family Medicine

## 2021-02-07 NOTE — Progress Notes (Signed)
Chief Complaint:   OBESITY Gina Washington is here to discuss Gina Washington progress with Gina Washington obesity treatment plan along with follow-up of Gina Washington obesity related diagnoses. Gina Washington is on the Category 1 Plan and keeping a food journal and adhering to recommended goals of 300-450 calories and 35 grams of protein at supper daily and states she is following Gina Washington eating plan approximately 80% of the time. Gina Washington states she is doing 0 minutes 0 times per week.  Today's visit was #: 6 Starting weight: 182 lbs Starting date: 11/16/2020 Today's weight: 176 lbs Today's date: 02/06/2021 Total lbs lost to date: 6 Total lbs lost since last in-office visit: 4  Interim History: Gina Washington has been journaling at supper.  She notes she needs to increase Gina Washington water intake. Gina Washington appetite is well controlled with Wegovy.  Subjective:   1. Polyphagia [name] notes some heartburn at night, constipation,  and mild nausea. She is on Wegovy 0.25 mg weekly.  2. Vitamin D deficiency [name]'s last Vit D level was low at 21.5. She is on weekly prescription Vit D per Gina Washington primary care physician.  3. At risk for side effect of medication [name] is at risk for drug side effects due to Gina Washington.  Assessment/Plan:   1. Polyphagia We will refill Wegovy 0.25 mg for 1 month. [name] is to try OTC Pepcid or Zantac, and take miralax OTC for constipation.    - Semaglutide-Weight Management (WEGOVY) 0.25 MG/0.5ML SOAJ; Inject 0.25 mg into the skin once a week.  Dispense: 2 mL; Refill: 0  2. Vitamin D deficiency  [name] agreed to continue taking prescription Vitamin D 50,000 IU every week and will follow-up for routine testing of Vitamin D, at least 2-3 times per year to avoid over-replacement.  3. At risk for side effect of medication [name] was given approximately 15 minutes of drug side effect counseling today.  We discussed side effect possibility and risk versus benefits. [name] agreed to the medication and will contact this office  if these side effects are intolerable.  Repetitive spaced learning was employed today to elicit superior memory formation and behavioral change.  4. Obesity: BMI 31.18 Gina Washington is currently in the action stage of change. As such, Gina Washington goal is to continue with weight loss efforts. She has agreed to the Category 1 Plan.   Exercise goals: No exercise has been prescribed at this time.  Behavioral modification strategies: meal planning and cooking strategies.  [name] has agreed to follow-up with our clinic in 3 weeks.   Objective:   Blood pressure 101/72, pulse 84, temperature 97.9 F (36.6 C), height 5\' 3"  (1.6 m), weight 176 lb (79.8 kg), SpO2 98 %. Body mass index is 31.18 kg/m.  General: Cooperative, alert, well developed, in no acute distress. HEENT: Conjunctivae and lids unremarkable. Cardiovascular: Regular rhythm.  Lungs: Normal work of breathing. Neurologic: No focal deficits.   Lab Results  Component Value Date   CREATININE 0.96 11/16/2020   BUN 15 11/16/2020   NA 138 11/16/2020   K 5.0 11/16/2020   CL 98 11/16/2020   CO2 23 11/16/2020   Lab Results  Component Value Date   ALT 13 11/16/2020   AST 22 11/16/2020   ALKPHOS 99 11/16/2020   BILITOT 0.6 11/16/2020   Lab Results  Component Value Date   HGBA1C 5.5 11/16/2020   Lab Results  Component Value Date   INSULIN 4.2 11/16/2020   Lab Results  Component Value Date   TSH 2.500 11/16/2020   Lab Results  Component Value Date   CHOL 228 (H) 11/16/2020   HDL 106 11/16/2020   LDLCALC 107 (H) 11/16/2020   TRIG 88 11/16/2020   CHOLHDL 2.2 11/16/2020   Lab Results  Component Value Date   WBC 6.3 11/16/2020   HGB 16.0 (H) 11/16/2020   HCT 46.3 11/16/2020   MCV 99 (H) 11/16/2020   PLT 272 11/16/2020   Lab Results  Component Value Date   IRON 233 (H) 11/16/2020   TIBC 344 11/16/2020   FERRITIN 449 (H) 11/16/2020   Attestation Statements:   Reviewed by clinician on day of visit: allergies,  medications, problem list, medical history, surgical history, family history, social history, and previous encounter notes.   Wilhemena Durie, am acting as Location manager for [employer], FNP-C.  I have reviewed the above documentation for accuracy and completeness, and I agree with the above. -  Georgianne Fick, FNP

## 2021-02-08 ENCOUNTER — Encounter: Payer: Self-pay | Admitting: Neurology

## 2021-02-08 ENCOUNTER — Ambulatory Visit: Payer: BC Managed Care – PPO | Admitting: Neurology

## 2021-02-08 VITALS — BP 110/74 | HR 68 | Ht 63.0 in | Wt 178.5 lb

## 2021-02-08 DIAGNOSIS — R29898 Other symptoms and signs involving the musculoskeletal system: Secondary | ICD-10-CM

## 2021-02-08 DIAGNOSIS — E538 Deficiency of other specified B group vitamins: Secondary | ICD-10-CM

## 2021-02-08 NOTE — Progress Notes (Signed)
Chief Complaint  Patient presents with  . Follow-up    She took Sinemet for three weeks. She stopped it because it did not improve her symptoms.  MRI brain was normal. She held off on the DaTscan to discuss the need for the test today. Her co-pay will be $2000.00.    HISTORICAL  Gina Washington is a 55 year old female, seen in request by her primary care physician Dr. Bea Graff, Marya Amsler for evaluation of right side difficulty, initial evaluation was on July 11, 2020  I reviewed and summarized the referring note.  Past medical history Hypertension Depression  About 10 years ago, around 2011, she began to noticed mild gait abnormality, dragging her right leg across the floor, at the same time she noticed changed sense of smell, she can smell smoke when there was no cigarette around, later her sense of smell slightly decreased, but she never lost the taste,  She works as a Product/process development scientist, over the years, she noticed gradual worsening right leg stiffness, she described tendency of her right leg to rollover to her ankle plantarflexion position, stinging sensation radiating down right lateral ankle, right lateral leg  She also describes loss of dexterity of her right hand, difficult to have neat hand writing, tends to hold her right hand in clawed position, difficulty brushing her teeth, has to change to electronic toothbrush   Over the years, she was seen by different neurologist, most recent evaluation was by Dr. Kyra Searles at [facility] in May 2020, reported normal MRI of brain in the past, was diagnosed with functional movement disorder, had received a physical therapy and occupational therapy with only temporary improvement  She never received any medication treatment in the past per patient,   Laboratory evaluations in August 2021 showed B12 deficiency level was 173, normal CMP, hemoglobin 14.6.  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  ALLERGIES: Allergies  Allergen Reactions  . Bactrim [Sulfamethoxazole-Trimethoprim] Hives  . Codeine Nausea And Vomiting  . Latex Hives    HOME MEDICATIONS: Current Outpatient Medications  Medication Sig Dispense Refill  . amLODipine (NORVASC) 5 MG tablet Take 5 mg by mouth daily.    [name] Kitchen b complex vitamins capsule Take 1 capsule by mouth daily.    [name] Kitchen buPROPion (WELLBUTRIN XL) 150 MG 24 hr tablet Take 1 tablet (150 mg total) by mouth daily. 90 tablet 1  . cyanocobalamin (,VITAMIN B-12,) 1000 MCG/ML injection Inject 1,000 mcg into the muscle every 28 (twenty-eight) days.    . diazepam (VALIUM) 5 MG tablet Bring Valium tablet to office on day of procedure. You will be instructed on when to take by  mouth 2 tablet 0  . metoprolol succinate (TOPROL-XL) 100 MG 24 hr tablet Take 100 mg by mouth daily.    . misoprostol (CYTOTEC) 200 MCG tablet Break up one  tablet and place in the vagina the night before procedure. 3 tablet 0  . Semaglutide-Weight Management (WEGOVY) 0.25 MG/0.5ML SOAJ Inject 0.25 mg into the skin once a week. 2 mL 0  . valsartan (DIOVAN) 160 MG tablet Take 160  mg by mouth daily.    . Vitamin D, Ergocalciferol, (DRISDOL) 1.25 MG (50000 UNIT) CAPS capsule Take 50,000 Units by mouth once a week.     No current facility-administered medications for this visit.    PAST MEDICAL HISTORY: Past Medical History:  Diagnosis Date  . ADHD   . Anxiety   . B12 deficiency   . Back pain   . Basal cell carcinoma    leg  . Basal cell carcinoma   . Constipation   . Functional movement disorder   . GERD (gastroesophageal reflux disease)   . Herpes zoster without complication   . Hypertension   . Kidney problem   . Lower extremity edema   . Migraine   . Neurological movement disorder   . Vaginal Pap smear, abnormal    age 53 yr- cryo done    PAST SURGICAL HISTORY: Past Surgical History:  Procedure Laterality Date  . ANKLE ARTHROSCOPY    . CESAREAN SECTION    . CRYOTHERAPY      FAMILY HISTORY: Family History  Problem Relation Age of Onset  . Diabetes Maternal Grandmother   . Cancer Maternal Grandfather        lung  . Hypertension Father   . Stroke Father   . Obesity Father   . Hypertension Mother   . Stroke Mother   . Hyperlipidemia Mother   . Kidney disease Mother   . Obesity Mother   . Dementia Paternal Grandmother     SOCIAL HISTORY: Social History   Socioeconomic History  . Marital status: Married    Spouse name: Not on file  . Number of children: 1  . Years of education: college  . Highest education level: Bachelor's degree (e.g., BA, AB, BS)  Occupational History  . Occupation: Pharmacist, hospital  Tobacco Use  . Smoking status: Former Research scientist (life sciences)  . Smokeless tobacco: Never Used  Vaping Use  . Vaping Use: Never used  Substance and Sexual Activity  . Alcohol use: Yes    Comment: 5 drinks per week  . Drug use: Never  . Sexual activity: Yes    Birth control/protection: I.U.D.  Other Topics Concern  . Not on file  Social History Narrative   Lives at home with her husband.   Right-handed.   One cup coffee per day.   Social  Determinants of Health   Financial Resource Strain: Not on file  Food Insecurity: Not on file  Transportation Needs: Not on file  Physical Activity: Not on file  Stress: Not on file  Social Connections: Not on file  Intimate Partner Violence: Not on file     PHYSICAL EXAM   Vitals:   02/08/21 0737  BP: 110/74  Pulse: 68  Weight: 178 lb 8 oz (81 kg)  Height: 5\' 3"  (1.6 m)   Not recorded     Body mass index is 31.62 kg/m.  PHYSICAL EXAMNIATION:  Gen: NAD, conversant, well nourised, well groomed       NEUROLOGICAL EXAM:  MENTAL STATUS: Speech:    Speech is normal;  fluent and spontaneous with normal comprehension.  Cognition:     Orientation to time, place and person     Normal recent and remote memory     Normal Attention span and concentration     Normal Language, naming, repeating,spontaneous speech     Fund of knowledge   CRANIAL NERVES: CN II: Visual fields are full to confrontation. Pupils are round equal and briskly reactive to light. CN III, IV, VI: extraocular movement are normal. No ptosis. CN V: Facial sensation is intact to light touch CN VII: Face is symmetric with normal eye closure  CN VIII: Hearing is normal to causal conversation. CN IX, X: Phonation is normal. CN XI: Head turning and shoulder shrug are intact  MOTOR: Muscle strength is normal, she has inconsistent right arm and foot bradykinesia, rigidity, slower movement  REFLEXES: Reflexes are 2+ and symmetric at the biceps, triceps, knees, and ankles. Plantar responses are flexor.  SENSORY: Intact to light touch, pinprick and vibratory sensation are intact in fingers and toes.  COORDINATION: There is no trunk or limb dysmetria noted.  GAIT/STANCE: She can get up from seated position arm crossed, decreased right arm swing, moderate stride, dragging right leg occasionally  DIAGNOSTIC DATA (LABS, IMAGING, TESTING) - I reviewed patient records, labs, notes, testing and imaging myself  where available.   ASSESSMENT AND PLAN  Gina Washington is a 55 y.o. female   Long history of gradual onset slowly progressive right arm and leg rigidity, also reported loss sense of smell,  She does have mild parkinsonian features on examination, but inconsistent, was diagnosed with functional movement disorder by movement specialist at [facility] in the past, received physical therapy occupational therapy which was helpful,   Reported no significant improvement with a trial of Sinemet 25/100 mg up to 3 times a day  MRI of the brain was normal  She did not proceed with DaTSCAN for concerning of the high cost  Her more than 10 years clinical history, variable degree of difficulty throughout the day, lack of significant improvement, or progression, normal MRI of the brain, all argue against a central nervous system degenerative disorder  After discussed with patient, decided proceed with physical therapy occupational therapy,  She desires continued follow-up, return in 6 months with Thea Alken, M.D. Ph.D.  [facility] [address], [place], [place] [address] Ph: [phone] Fax: [phone]  CC:  Raina Mina., MD [address] [place],  [place] [address]

## 2021-02-27 ENCOUNTER — Ambulatory Visit (INDEPENDENT_AMBULATORY_CARE_PROVIDER_SITE_OTHER): Payer: BC Managed Care – PPO | Admitting: Family Medicine

## 2021-03-14 ENCOUNTER — Ambulatory Visit: Payer: BC Managed Care – PPO | Admitting: Obstetrics & Gynecology

## 2021-03-19 ENCOUNTER — Other Ambulatory Visit: Payer: Self-pay

## 2021-03-19 ENCOUNTER — Encounter (INDEPENDENT_AMBULATORY_CARE_PROVIDER_SITE_OTHER): Payer: Self-pay | Admitting: Family Medicine

## 2021-03-19 ENCOUNTER — Ambulatory Visit (INDEPENDENT_AMBULATORY_CARE_PROVIDER_SITE_OTHER): Payer: BC Managed Care – PPO | Admitting: Family Medicine

## 2021-03-19 VITALS — BP 108/75 | HR 77 | Temp 98.2°F | Ht 63.0 in | Wt 171.0 lb

## 2021-03-19 DIAGNOSIS — I10 Essential (primary) hypertension: Secondary | ICD-10-CM

## 2021-03-19 DIAGNOSIS — Z9189 Other specified personal risk factors, not elsewhere classified: Secondary | ICD-10-CM

## 2021-03-19 DIAGNOSIS — Z6832 Body mass index (BMI) 32.0-32.9, adult: Secondary | ICD-10-CM | POA: Diagnosis not present

## 2021-03-19 DIAGNOSIS — M546 Pain in thoracic spine: Secondary | ICD-10-CM

## 2021-03-19 DIAGNOSIS — E669 Obesity, unspecified: Secondary | ICD-10-CM

## 2021-03-19 MED ORDER — SAXENDA 18 MG/3ML ~~LOC~~ SOPN: 3.0000 mg | PEN_INJECTOR | Freq: Every day | SUBCUTANEOUS | 0 refills | Status: DC

## 2021-03-19 MED ORDER — BD PEN NEEDLE NANO 2ND GEN 32G X 4 MM MISC: 0 refills | Status: DC

## 2021-03-21 NOTE — Progress Notes (Signed)
Chief Complaint:   OBESITY Gina Washington is here to discuss her progress with her obesity treatment plan along with follow-up of her obesity related diagnoses. Gina Washington is on the Category 1 Plan and states she is following her eating plan approximately 60-70% of the time. [name] states she is doing 0 minutes 0 times per week.  Today's visit was #: 7 Starting weight: 182 lbs Starting date: 11/16/2020 Today's weight: 171 lbs Today's date: 03/19/2021 Total lbs lost to date: 11 Total lbs lost since last in-office visit: 5  Interim History: Gina Washington's last office visit was 02/06/2021 and she is down 5 lbs. She has been having good appetite suppression. She is getting in her protein.  Subjective:   1. Acute right-sided thoracic back pain Gina Washington notes pain in her right upper back. She has tried a massage, Tylenol, and ibuprofen without much relief. Tylenol and OTC pain patch have helped some. She has no radiation down her arm or leg. She is prescribed physical therapy for motion issues.  2. HTN (hypertension), benign Jamita's blood pressure is well controlled on Norvasc and metoprolol.  BP Readings from Last 3 Encounters:  03/19/21 108/75  02/08/21 110/74  02/06/21 101/72   Lab Results  Component Value Date   CREATININE 0.96 11/16/2020   3. At risk for side effect of medication Bree is at risk for drug side effects due to switching to [place].  Assessment/Plan:   1. Acute right-sided thoracic back pain Gina Washington will go to physical therapy as prescribed. We discussed good ergonomics when working on computer.  2. HTN (hypertension), benign Gina Washington will continue her anti-hypertensives.  3. At risk for side effect of medication Gina Washington was given approximately 15 minutes of drug side effect counseling today. We discussed side effect possibility and risk versus benefits. [name] agreed to the medication and will contact this office if these side effects are intolerable.  Repetitive  spaced learning was employed today to elicit superior memory formation and behavioral change.  4. Obesity: BMI 30.3 [name] is currently in the action stage of change. As such, her goal is to continue with weight loss efforts. She has agreed to the Category 1 Plan.   Start Saxenda 0.6 mg SubQ daily with no refills, and pen needles #100 with no refills. Switch due to non-availability of Wegovy.  - Liraglutide -Weight Management (SAXENDA) 18 MG/3ML SOPN; Inject 3 mg into the skin daily.  Dispense: 15 mL; Refill: 0 - Insulin Pen Needle (BD PEN NEEDLE NANO 2ND GEN) 32G X 4 MM MISC; Use 1 needle daily to inject Saxenda.  Dispense: 100 each; Refill: 0  Exercise goals: No exercise has been prescribed at this time.  Behavioral modification strategies: planning for success.  Gina Washington has agreed to follow-up with our clinic in 3 to 4 weeks.  Objective:   Blood pressure 108/75, pulse 77, temperature 98.2 F (36.8 C), height 5\' 3"  (1.6 m), weight 171 lb (77.6 kg), SpO2 97 %. Body mass index is 30.29 kg/m.  General: Cooperative, alert, well developed, in no acute distress. HEENT: Conjunctivae and lids unremarkable. Cardiovascular: Regular rhythm.  Lungs: Normal work of breathing. Neurologic: No focal deficits.   Lab Results  Component Value Date   CREATININE 0.96 11/16/2020   BUN 15 11/16/2020   NA 138 11/16/2020   K 5.0 11/16/2020   CL 98 11/16/2020   CO2 23 11/16/2020   Lab Results  Component Value Date   ALT 13 11/16/2020   AST 22 11/16/2020   ALKPHOS 99  11/16/2020   BILITOT 0.6 11/16/2020   Lab Results  Component Value Date   HGBA1C 5.5 11/16/2020   Lab Results  Component Value Date   INSULIN 4.2 11/16/2020   Lab Results  Component Value Date   TSH 2.500 11/16/2020   Lab Results  Component Value Date   CHOL 228 (H) 11/16/2020   HDL 106 11/16/2020   LDLCALC 107 (H) 11/16/2020   TRIG 88 11/16/2020   CHOLHDL 2.2 11/16/2020   Lab Results  Component Value Date   WBC  6.3 11/16/2020   HGB 16.0 (H) 11/16/2020   HCT 46.3 11/16/2020   MCV 99 (H) 11/16/2020   PLT 272 11/16/2020   Lab Results  Component Value Date   IRON 233 (H) 11/16/2020   TIBC 344 11/16/2020   FERRITIN 449 (H) 11/16/2020   Attestation Statements:   Reviewed by clinician on day of visit: allergies, medications, problem list, medical history, surgical history, family history, social history, and previous encounter notes.   Wilhemena Durie, am acting as Location manager for [employer], FNP-C.  I have reviewed the above documentation for accuracy and completeness, and I agree with the above. - Georgianne Fick, FNP

## 2021-03-23 ENCOUNTER — Ambulatory Visit: Payer: BC Managed Care – PPO | Admitting: Obstetrics & Gynecology

## 2021-03-25 DIAGNOSIS — I10 Essential (primary) hypertension: Secondary | ICD-10-CM | POA: Insufficient documentation

## 2021-03-25 DIAGNOSIS — E1159 Type 2 diabetes mellitus with other circulatory complications: Secondary | ICD-10-CM | POA: Insufficient documentation

## 2021-03-28 ENCOUNTER — Telehealth (INDEPENDENT_AMBULATORY_CARE_PROVIDER_SITE_OTHER): Payer: Self-pay

## 2021-03-28 NOTE — Telephone Encounter (Signed)
Prior Auth for Gina Washington has been submitted and approved.

## 2021-04-11 ENCOUNTER — Ambulatory Visit (INDEPENDENT_AMBULATORY_CARE_PROVIDER_SITE_OTHER): Payer: BC Managed Care – PPO | Admitting: Family Medicine

## 2021-04-12 ENCOUNTER — Other Ambulatory Visit: Payer: Self-pay | Admitting: Psychiatry

## 2021-04-12 DIAGNOSIS — F902 Attention-deficit hyperactivity disorder, combined type: Secondary | ICD-10-CM

## 2021-04-13 NOTE — Telephone Encounter (Signed)
Please schedule appt

## 2021-04-13 NOTE — Telephone Encounter (Signed)
Pt has an appt on 8/9

## 2021-04-13 NOTE — Telephone Encounter (Signed)
Last filled 3/26

## 2021-05-01 ENCOUNTER — Other Ambulatory Visit: Payer: Self-pay

## 2021-05-01 ENCOUNTER — Ambulatory Visit (INDEPENDENT_AMBULATORY_CARE_PROVIDER_SITE_OTHER): Payer: BC Managed Care – PPO | Admitting: Family Medicine

## 2021-05-01 ENCOUNTER — Encounter (INDEPENDENT_AMBULATORY_CARE_PROVIDER_SITE_OTHER): Payer: Self-pay | Admitting: Family Medicine

## 2021-05-01 VITALS — BP 120/84 | HR 79 | Temp 98.0°F | Ht 63.0 in | Wt 175.0 lb

## 2021-05-01 DIAGNOSIS — E669 Obesity, unspecified: Secondary | ICD-10-CM

## 2021-05-01 DIAGNOSIS — Z6832 Body mass index (BMI) 32.0-32.9, adult: Secondary | ICD-10-CM | POA: Diagnosis not present

## 2021-05-01 DIAGNOSIS — I152 Hypertension secondary to endocrine disorders: Secondary | ICD-10-CM

## 2021-05-01 DIAGNOSIS — E1159 Type 2 diabetes mellitus with other circulatory complications: Secondary | ICD-10-CM

## 2021-05-08 NOTE — Progress Notes (Signed)
Chief Complaint:   OBESITY Gina Washington is here to discuss her progress with her obesity treatment plan along with follow-up of her obesity related diagnoses. Gina Washington is on the Category 1 Plan and states she is following her eating plan approximately 33% of the time. Gina Washington states she is doing 0 minutes 0 times per week.  Today's visit was #: 8 Starting weight: 182 lbs Starting date: 11/16/2020 Today's weight: 175 lbs Today's date: 05/01/2021 Total lbs lost to date: 7 lbs Total lbs lost since last in-office visit: 0  Interim History: Gina Washington did not start [place] because she is afraid of Medullary Thyroid Cancer. She went on vacation for a few weeks and has been back on plan. She notes she tends to snack more a night.  Subjective:   1. Hypertension associated with type 2 diabetes mellitus ([name]) Gina Washington's hypertension is well controlled. She is on Diovan.  BP Readings from Last 3 Encounters:  05/01/21 120/84  03/19/21 108/75  02/08/21 110/74      Assessment/Plan:   1. Hypertension associated with type 2 diabetes mellitus (Gina Washington) Gina Washington will continue taking Diovan and she is working on healthy weight loss and exercise to improve blood pressure control. We will watch for signs of hypotension as she continues her lifestyle modifications.   2. Obesity: BMI 31.01 Gina Washington is currently in the action stage of change. As such, her goal is to continue with weight loss efforts. She has agreed to the Category 1 Plan.   Gina Washington will start Gina Washington today.We discussed her concerns about MTC and I reassured her that this is very unlikely to occur.   Exercise goals: No exercise has been prescribed at this time.  Behavioral modification strategies: increasing lean protein intake and decreasing simple carbohydrates.  Gina Washington has agreed to follow-up with our clinic in 3 weeks.  Objective:   Blood pressure 120/84, pulse 79, temperature 98 F (36.7 C), height '5\' 3"'$  (1.6 m), weight 175 lb (79.4  kg), SpO2 96 %. Body mass index is 31 kg/m.  General: Cooperative, alert, well developed, in no acute distress. HEENT: Conjunctivae and lids unremarkable. Cardiovascular: Regular rhythm.  Lungs: Normal work of breathing. Neurologic: No focal deficits.   Lab Results  Component Value Date   CREATININE 0.96 11/16/2020   BUN 15 11/16/2020   NA 138 11/16/2020   K 5.0 11/16/2020   CL 98 11/16/2020   CO2 23 11/16/2020   Lab Results  Component Value Date   ALT 13 11/16/2020   AST 22 11/16/2020   ALKPHOS 99 11/16/2020   BILITOT 0.6 11/16/2020   Lab Results  Component Value Date   HGBA1C 5.5 11/16/2020   Lab Results  Component Value Date   INSULIN 4.2 11/16/2020   Lab Results  Component Value Date   TSH 2.500 11/16/2020   Lab Results  Component Value Date   CHOL 228 (H) 11/16/2020   HDL 106 11/16/2020   LDLCALC 107 (H) 11/16/2020   TRIG 88 11/16/2020   CHOLHDL 2.2 11/16/2020   Lab Results  Component Value Date   VD25OH 21.5 (L) 11/16/2020   Lab Results  Component Value Date   WBC 6.3 11/16/2020   HGB 16.0 (H) 11/16/2020   HCT 46.3 11/16/2020   MCV 99 (H) 11/16/2020   PLT 272 11/16/2020   Lab Results  Component Value Date   IRON 233 (H) 11/16/2020   TIBC 344 11/16/2020   FERRITIN 449 (H) 11/16/2020    Attestation Statements:   Reviewed by clinician  on day of visit: allergies, medications, problem list, medical history, surgical history, family history, social history, and previous encounter notes.  I, Lizbeth Bark, RMA, am acting as Location manager for [employer], [place].  I have reviewed the above documentation for accuracy and completeness, and I agree with the above. -  Georgianne Fick, FNP

## 2021-05-14 ENCOUNTER — Encounter: Payer: Self-pay | Admitting: General Practice

## 2021-05-14 ENCOUNTER — Encounter: Payer: Self-pay | Admitting: Obstetrics & Gynecology

## 2021-05-14 ENCOUNTER — Other Ambulatory Visit: Payer: Self-pay

## 2021-05-14 ENCOUNTER — Ambulatory Visit (INDEPENDENT_AMBULATORY_CARE_PROVIDER_SITE_OTHER): Payer: BC Managed Care – PPO | Admitting: Obstetrics & Gynecology

## 2021-05-14 VITALS — BP 114/56 | HR 67 | Wt 172.0 lb

## 2021-05-14 DIAGNOSIS — T8339XA Other mechanical complication of intrauterine contraceptive device, initial encounter: Secondary | ICD-10-CM

## 2021-05-14 DIAGNOSIS — T8332XD Displacement of intrauterine contraceptive device, subsequent encounter: Secondary | ICD-10-CM

## 2021-05-14 DIAGNOSIS — Z30432 Encounter for removal of intrauterine contraceptive device: Secondary | ICD-10-CM | POA: Diagnosis not present

## 2021-05-14 DIAGNOSIS — N912 Amenorrhea, unspecified: Secondary | ICD-10-CM

## 2021-05-14 MED ORDER — KETOROLAC TROMETHAMINE 30 MG/ML IJ SOLN
30.0000 mg | Freq: Once | INTRAMUSCULAR | Status: AC
  Administered 2021-05-14: 30 mg via INTRAMUSCULAR

## 2021-05-14 NOTE — Progress Notes (Signed)
Patient presents for hysteroscopy for lost IUD strings. Gina Alu RN

## 2021-05-14 NOTE — Progress Notes (Signed)
INDICATIONS: 55 y.o. [name]000  here for scheduled surgery for removal of retained IUD.   Risks of surgery were discussed with the patient including but not limited to: bleeding which may require transfusion; infection which may require antibiotics; injury to uterus or surrounding organs; intrauterine scarring which may impair future fertility; need for additional procedures including laparotomy or laparoscopy; and other postoperative/anesthesia complications. Written informed consent was obtained.    FINDINGS:  IUD strings noted in the lower uterine segment. The int os was not dilated.     ANESTHESIA:   General, paracervical block. FLUID DEFICITS:  <50cc of Normal saline ESTIMATED BLOOD LOSS:  Less than 10 ml SPECIMENS: IUD- discarded.  COMPLICATIONS:  None immediate.  PROCEDURE DETAILS:  The patient was taken to the procedure room where she received Toradol 10 mg IM. She also took Valium '10mg'$  in the waiting area just prior to the procedure and cytotec 433mg ~ hours prior to the visit.  After an adequate timeout was performed, she was placed in the dorsal lithotomy position and examined; then prepped and draped in the sterile manner.   A speculum was then placed in the patient's vagina.  A paracervical block of 10cc of 2% lidocaine with epinephrine was placed with 10cc at both 5 and 7 o'clock.  A single tooth tenaculum was applied to the anterior lip of the cervix.   A 574mhysteroscope was inserted under direct visualization using normal saline as a distending medium.  The int os would not allow the scope entry but, as I was seeking to remove the scope to further dilate the os, I noted the IUD strings. There were grasped with the hysteroscopic graspers and the IUD was removed in its entirety. At this point the tenaculum was removed from the anterior lip of the cervix and the vaginal speculum was removed after noting good hemostasis.  The patient tolerated the procedure well.   There were no immediate  complications.  FSParkinoday  The patient will be discharged to home. Routine postoperative instructions given.    Quinzell Malcomb L. Harraway-Smith, M.D., FACherlynn June

## 2021-05-14 NOTE — Patient Instructions (Signed)
Hysteroscopy Hysteroscopy is a procedure used to look inside a woman's womb (uterus). This may be done for various reasons, including: To look for tumors and other growths in the uterus. To evaluate abnormal bleeding, fibroid tumors, polyps, scar tissue, or uterine cancer. To determine why a woman is unable to get pregnant or has had repeated pregnancy losses. To locate an IUD (intrauterine device). To place a birth control device into the fallopian tubes. During this procedure, a thin, flexible tube with a small light and camera (hysteroscope) is used to examine the uterus. The camera sends images to a monitor in the room so that your health care provider can view the inside of your uterus. Ahysteroscopy should be done right after a menstrual period. Tell a health care provider about: Any allergies you have. All medicines you are taking, including vitamins, herbs, eye drops, creams, and over-the-counter medicines. Any problems you or family members have had with anesthetic medicines. Any blood disorders you have. Any surgeries you have had. Any medical conditions you have. Whether you are pregnant or may be pregnant. Whether you have been diagnosed with an STI (sexually transmitted infection) or you think you have an STI. What are the risks? Generally, this is a safe procedure. However, problems may occur, including: Excessive bleeding. Infection. Damage to the uterus or other structures or organs. Allergic reaction to medicines or fluids that are used in the procedure. What happens before the procedure? Staying hydrated Follow instructions from your health care provider about hydration, which may include: Up to 2 hours before the procedure - you may continue to drink clear liquids, such as water, clear fruit juice, black coffee, and plain tea. Eating and drinking restrictions Follow instructions from your health care provider about eating and drinking, which may include: 8 hours before  the procedure - stop eating solid foods and drink clear liquids only. 2 hours before the procedure - stop drinking clear liquids. Medicines Ask your health care provider about: Changing or stopping your regular medicines. This is especially important if you are taking diabetes medicines or blood thinners. Taking medicines such as aspirin and ibuprofen. These medicines can thin your blood. Do not take these medicines unless your health care provider tells you to take them. Taking over-the-counter medicines, vitamins, herbs, and supplements. Medicine may be placed in your cervix the day before the procedure. This medicine causes the cervix to open (dilate). The larger opening makes it easier for the hysteroscope to be inserted into the uterus during the procedure. General instructions Ask your health care provider: What steps will be taken to help prevent infection. These steps may include: Washing skin with a germ-killing soap. Taking antibiotic medicine. Do not use any products that contain nicotine or tobacco for at least 4 weeks before the procedure. These products include cigarettes, chewing tobacco, and vaping devices, such as e-cigarettes. If you need help quitting, ask your health care provider. Plan to have a responsible adult take you home from the hospital or clinic. Plan to have a responsible adult care for you for the time you are told after you leave the hospital or clinic. This is important. Empty your bladder before the procedure begins. What happens during the procedure? An IV will be inserted into one of your veins. You may be given: A medicine to help you relax (sedative). A medicine that numbs the area around the cervix (local anesthetic). A medicine to make you fall asleep (general anesthetic). A hysteroscope will be inserted through your vagina and   will be inserted into one of your veins.  You may be given:  A medicine to help you relax (sedative).  A medicine that numbs the area around the cervix (local anesthetic).  A medicine to make you fall asleep (general anesthetic).  A hysteroscope will be inserted through your vagina and into your uterus.  Air or fluid will be used to enlarge your uterus to allow your health care provider to see it better.  The amount of fluid used will be carefully checked throughout the procedure.  In some cases, tissue may be gently scraped from inside the uterus and sent to a lab for testing (biopsy).  The procedure may vary among health care providers and hospitals.  What can I expect after the procedure?  Your blood pressure, heart rate, breathing rate, and blood oxygen level will be monitored until you leave the hospital or clinic.  You may have cramps. You may be given medicines for this.  You may have bleeding, which may vary from light spotting to menstrual-like bleeding. This is normal.  If you had a biopsy, it is up to you to get the results. Ask your health care provider, or the department that is doing the procedure, when your results will be ready.  Follow these instructions at home:  Activity  Rest as told by your health care provider.  Return to your normal activities as told by your health care provider. Ask your health care provider what activities are safe for you.  If you were given a sedative during the procedure, it can affect you for several hours. Do not drive or operate machinery until your health care provider says that it is safe.  Medicines  Do not take aspirin or other NSAIDs during recovery, as told by your healthcare provider. It can increase the risk of bleeding.  Ask your health care provider if the medicine prescribed to you:  Requires you to avoid driving or using machinery.  Can cause constipation. You may need to take these actions to prevent or treat constipation:  Drink enough fluid to keep your urine pale yellow.  Take over-the-counter or prescription medicines.  Eat foods that are high in fiber, such as beans, whole grains, and fresh fruits and vegetables.  Limit foods that are high in fat and processed sugars, such as fried or sweet foods.  General instructions  Do not douche, use tampons, or have sex for 2 weeks after the procedure, or until your health care provider approves.  Do not take  baths, swim, or use a hot tub until your health care provider approves. Take showers instead of baths for 2 weeks, or for as long as told by your health care provider.  Keep all follow-up visits. This is important.  Contact a health care provider if:  You feel dizzy or lightheaded.  You feel nauseous.  You have abnormal vaginal discharge.  You have a rash.  You have pain that does not get better with medicine.  You have chills.  Get help right away if:  You have bleeding that is heavier than a normal menstrual period.  You have a fever.  You have pain or cramps that get worse.  You develop new abdominal pain.  You faint.  You have pain in your shoulder.  You are short of breath.  Summary  Hysteroscopy is a procedure that is used to look inside a woman's womb (uterus).  After the procedure, you may have bleeding, which varies from light spotting to

## 2021-05-15 LAB — FOLLICLE STIMULATING HORMONE: FSH: 48.2 m[IU]/mL

## 2021-05-21 ENCOUNTER — Other Ambulatory Visit: Payer: Self-pay | Admitting: Psychiatry

## 2021-05-21 DIAGNOSIS — F902 Attention-deficit hyperactivity disorder, combined type: Secondary | ICD-10-CM

## 2021-05-22 ENCOUNTER — Encounter: Payer: Self-pay | Admitting: Psychiatry

## 2021-05-22 ENCOUNTER — Ambulatory Visit (INDEPENDENT_AMBULATORY_CARE_PROVIDER_SITE_OTHER): Payer: BC Managed Care – PPO | Admitting: Family Medicine

## 2021-05-22 ENCOUNTER — Ambulatory Visit: Payer: BC Managed Care – PPO | Admitting: Psychiatry

## 2021-05-22 ENCOUNTER — Other Ambulatory Visit: Payer: Self-pay

## 2021-05-22 VITALS — BP 118/83 | HR 77

## 2021-05-22 DIAGNOSIS — F411 Generalized anxiety disorder: Secondary | ICD-10-CM | POA: Diagnosis not present

## 2021-05-22 DIAGNOSIS — F902 Attention-deficit hyperactivity disorder, combined type: Secondary | ICD-10-CM | POA: Diagnosis not present

## 2021-05-22 DIAGNOSIS — F3342 Major depressive disorder, recurrent, in full remission: Secondary | ICD-10-CM | POA: Diagnosis not present

## 2021-05-22 MED ORDER — LISDEXAMFETAMINE DIMESYLATE 50 MG PO CAPS: 50.0000 mg | ORAL_CAPSULE | Freq: Every day | ORAL | 0 refills | Status: DC

## 2021-05-22 MED ORDER — BUPROPION HCL ER (XL) 150 MG PO TB24: 150.0000 mg | ORAL_TABLET | Freq: Every day | ORAL | 1 refills | Status: DC

## 2021-05-22 MED ORDER — LISDEXAMFETAMINE DIMESYLATE 50 MG PO CAPS: ORAL_CAPSULE | ORAL | 0 refills | Status: DC

## 2021-05-22 NOTE — Telephone Encounter (Signed)
Last filled 7/5 appt on 8/9

## 2021-05-22 NOTE — Progress Notes (Signed)
[name] 01-05-1966 55 y.o.  Subjective:   Patient ID:  Gina Washington is a 55 y.o. (DOB [birth year]) female.  Chief Complaint:  Chief Complaint  Patient presents with   Follow-up    ADHD, anxiety, depression    HPI Gina Washington presents to the office today for follow-up of ADHD, depression, and anxiety. She reports that her anxiety has been ok with only brief, fleeting episodes of anxiety. Denies panic s/s. She reports that she continues to notice foot drag. Denies excessive worry. Denies depressed mood. Energy and motivation have been good. She reports that she has been doing more things around the house. 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 She has been spending time with family. She reports that her sleep has been poor the last couple of weeks. She has been experiencing middle of the night awakenings. She has been napping the last couple of weeks while she is on a break. Appetite has been good. She repots that she has lost 12 lbs intentionally. She has been trying to increase intake of protein and lowering carb intake. She reports that concentration has been adequate. Denies SI.   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 She has a new job. She will working at [employer] near her house and has worked with the principal in the past. Her commute will be 20 minutes compared to 45 minutes.   Husband has been doing ok. They took a vacation.   [facility] Office Visit from 11/16/2020 in [place]  PHQ-2 Total Score 4  PHQ-9 Total Score 14        Review of Systems:  Review of Systems  Medications: I have reviewed the patient's current medications.  Current Outpatient Medications  Medication Sig Dispense Refill   amLODipine (NORVASC) 5 MG tablet Take 5 mg by mouth daily.     b complex vitamins capsule Take 1 capsule by mouth daily.     cyanocobalamin (,VITAMIN B-12,) 1000 MCG/ML injection Inject 1,000 mcg into the  muscle every 28 (twenty-eight) days.     Liraglutide -Weight Management (SAXENDA) 18 MG/3ML SOPN Inject 3 mg into the skin daily. 15 mL 0   [START ON 06/19/2021] lisdexamfetamine (VYVANSE) 50 MG capsule Take 1 capsule (50 mg total) by mouth daily. 30 capsule 0   [START ON 07/17/2021] lisdexamfetamine (VYVANSE) 50 MG capsule Take 1 capsule (50 mg total) by mouth daily. 30 capsule 0   metoprolol succinate (TOPROL-XL) 100 MG 24 hr tablet Take 100 mg by mouth daily.     valsartan (DIOVAN) 160 MG tablet Take 160 mg by mouth daily.     Vitamin D, Ergocalciferol, (DRISDOL) 1.25 MG (50000 UNIT) CAPS capsule Take 50,000 Units by mouth once a week.     buPROPion (WELLBUTRIN XL) 150 MG 24 hr tablet Take 1 tablet (150 mg total) by mouth daily. 90 tablet 1   diazepam (VALIUM) 5 MG tablet Bring Valium tablet to office on day of procedure. You will be instructed on when to take by mouth (Patient not taking: Reported on 05/22/2021) 2 tablet 0   Insulin Pen Needle (BD PEN NEEDLE NANO 2ND GEN) 32G X 4 MM MISC Use 1 needle daily to inject Saxenda. 100 each 0   lisdexamfetamine (VYVANSE) 50 MG capsule TAKE 1 CAPSULE BY MOUTH EVERY DAY 30 capsule 0   misoprostol (CYTOTEC) 200 MCG tablet Break up one  tablet and place in the vagina the night before procedure. (  Patient not taking: Reported on 05/22/2021) 3 tablet 0   No current facility-administered medications for this visit.    Medication Side Effects: None  Allergies:  Allergies  Allergen Reactions   Bactrim [Sulfamethoxazole-Trimethoprim] Hives   Codeine Nausea And Vomiting   Latex Hives    Past Medical History:  Diagnosis Date   ADHD    Anxiety    B12 deficiency    Back pain    Basal cell carcinoma    leg   Basal cell carcinoma    Constipation    Functional movement disorder    GERD (gastroesophageal reflux disease)    Herpes zoster without complication    Hypertension    Kidney problem    Lower extremity edema    Migraine    Neurological movement  disorder    Vaginal Pap smear, abnormal    age 62 yr- cryo done    Past Medical History, Surgical history, Social history, and Family history were reviewed and updated as appropriate.   Please see review of systems for further details on the patient's review from today.   Objective:   Physical Exam:  BP 118/83   Pulse 77   Physical Exam  Lab Review:     Component Value Date/Time   NA 138 11/16/2020 1005   K 5.0 11/16/2020 1005   CL 98 11/16/2020 1005   CO2 23 11/16/2020 1005   GLUCOSE 86 11/16/2020 1005   BUN 15 11/16/2020 1005   CREATININE 0.96 11/16/2020 1005   CALCIUM 9.9 11/16/2020 1005   PROT 7.5 11/16/2020 1005   ALBUMIN 4.3 11/16/2020 1005   AST 22 11/16/2020 1005   ALT 13 11/16/2020 1005   ALKPHOS 99 11/16/2020 1005   BILITOT 0.6 11/16/2020 1005   GFRNONAA 67 11/16/2020 1005   GFRAA 77 11/16/2020 1005       Component Value Date/Time   WBC 6.3 11/16/2020 1005   RBC 4.67 11/16/2020 1005   HGB 16.0 (H) 11/16/2020 1005   HCT 46.3 11/16/2020 1005   PLT 272 11/16/2020 1005   MCV 99 (H) 11/16/2020 1005   MCH 34.3 (H) 11/16/2020 1005   MCHC 34.6 11/16/2020 1005   RDW 12.9 11/16/2020 1005   LYMPHSABS 1.9 11/16/2020 1005   EOSABS 0.1 11/16/2020 1005   BASOSABS 0.1 11/16/2020 1005    No results found for: POCLITH, LITHIUM   No results found for: PHENYTOIN, PHENOBARB, VALPROATE, CBMZ   .res Assessment: Plan:    Will continue Vyvanse 50 mg daily for ADHD. Continue Wellbutrin XL 150 mg daily for depression. Discussed that anxiety signs and symptoms are currently manageable and she has experienced an overall improvement in psychosocial stressors. Treatment indicated at this time for anxiety. Patient to follow-up in 3 months or sooner if clinically indicated. Patient advised to contact office with any questions, adverse effects, or acute worsening in signs and symptoms.   [name] was seen today for follow-up.  Diagnoses and all orders for this  visit:  Attention deficit hyperactivity disorder, combined type -     lisdexamfetamine (VYVANSE) 50 MG capsule; TAKE 1 CAPSULE BY MOUTH EVERY DAY -     lisdexamfetamine (VYVANSE) 50 MG capsule; Take 1 capsule (50 mg total) by mouth daily. -     lisdexamfetamine (VYVANSE) 50 MG capsule; Take 1 capsule (50 mg total) by mouth daily.  Recurrent major depressive disorder, in full remission ([place]) -     buPROPion (WELLBUTRIN XL) 150 MG 24 hr tablet; Take 1 tablet (150 mg total)  by mouth daily.  Generalized anxiety disorder    Please see After Visit Summary for patient specific instructions.  Future Appointments  Date Time Provider [place]  05/29/2021 10:00 AM Georgia Lopes, DO [facility] None  08/06/2021  7:30 AM Marcial Pacas, MD [facility] None  08/23/2021  3:30 PM Thayer Headings, PMHNP [facility] None    No orders of the defined types were placed in this encounter.   -------------------------------

## 2021-05-29 ENCOUNTER — Other Ambulatory Visit: Payer: Self-pay

## 2021-05-29 ENCOUNTER — Ambulatory Visit (INDEPENDENT_AMBULATORY_CARE_PROVIDER_SITE_OTHER): Payer: BC Managed Care – PPO | Admitting: Bariatrics

## 2021-05-29 ENCOUNTER — Encounter (INDEPENDENT_AMBULATORY_CARE_PROVIDER_SITE_OTHER): Payer: Self-pay | Admitting: Bariatrics

## 2021-05-29 VITALS — BP 120/80 | HR 68 | Temp 97.7°F | Ht 63.0 in | Wt 171.0 lb

## 2021-05-29 DIAGNOSIS — I10 Essential (primary) hypertension: Secondary | ICD-10-CM

## 2021-05-29 DIAGNOSIS — Z9189 Other specified personal risk factors, not elsewhere classified: Secondary | ICD-10-CM

## 2021-05-29 DIAGNOSIS — E559 Vitamin D deficiency, unspecified: Secondary | ICD-10-CM

## 2021-05-29 DIAGNOSIS — E538 Deficiency of other specified B group vitamins: Secondary | ICD-10-CM | POA: Diagnosis not present

## 2021-05-29 DIAGNOSIS — Z6832 Body mass index (BMI) 32.0-32.9, adult: Secondary | ICD-10-CM

## 2021-05-29 DIAGNOSIS — D582 Other hemoglobinopathies: Secondary | ICD-10-CM

## 2021-05-29 DIAGNOSIS — E7800 Pure hypercholesterolemia, unspecified: Secondary | ICD-10-CM | POA: Diagnosis not present

## 2021-05-29 DIAGNOSIS — E669 Obesity, unspecified: Secondary | ICD-10-CM

## 2021-05-30 LAB — LIPID PANEL WITH LDL/HDL RATIO
Cholesterol, Total: 211 mg/dL — ABNORMAL HIGH (ref 100–199)
HDL: 84 mg/dL (ref >39)
LDL Chol Calc (NIH): 115 mg/dL — ABNORMAL HIGH (ref 0–99)
LDL/HDL Ratio: 1.4 ratio (ref 0.0–3.2)
Triglycerides: 67 mg/dL (ref 0–149)
VLDL Cholesterol Cal: 12 mg/dL (ref 5–40)

## 2021-05-30 LAB — CBC WITH DIFFERENTIAL/PLATELET
Basophils Absolute: 0 10*3/uL (ref 0.0–0.2)
Basos: 1 %
EOS (ABSOLUTE): 0.1 10*3/uL (ref 0.0–0.4)
Eos: 2 %
Hematocrit: 46.4 % (ref 34.0–46.6)
Hemoglobin: 15.4 g/dL (ref 11.1–15.9)
Immature Grans (Abs): 0 10*3/uL (ref 0.0–0.1)
Immature Granulocytes: 0 %
Lymphocytes Absolute: 1.8 10*3/uL (ref 0.7–3.1)
Lymphs: 34 %
MCH: 32.8 pg (ref 26.6–33.0)
MCHC: 33.2 g/dL (ref 31.5–35.7)
MCV: 99 fL — ABNORMAL HIGH (ref 79–97)
Monocytes Absolute: 0.4 10*3/uL (ref 0.1–0.9)
Monocytes: 7 %
Neutrophils Absolute: 3 10*3/uL (ref 1.4–7.0)
Neutrophils: 56 %
Platelets: 251 10*3/uL (ref 150–450)
RBC: 4.7 x10E6/uL (ref 3.77–5.28)
RDW: 13 % (ref 11.7–15.4)
WBC: 5.3 10*3/uL (ref 3.4–10.8)

## 2021-05-30 LAB — COMPREHENSIVE METABOLIC PANEL
ALT: 18 IU/L (ref 0–32)
AST: 14 IU/L (ref 0–40)
Albumin/Globulin Ratio: 1.8 (ref 1.2–2.2)
Albumin: 4.4 g/dL (ref 3.8–4.9)
Alkaline Phosphatase: 88 IU/L (ref 44–121)
BUN/Creatinine Ratio: 11 (ref 9–23)
BUN: 10 mg/dL (ref 6–24)
Bilirubin Total: 0.3 mg/dL (ref 0.0–1.2)
CO2: 24 mmol/L (ref 20–29)
Calcium: 9.6 mg/dL (ref 8.7–10.2)
Chloride: 101 mmol/L (ref 96–106)
Creatinine, Ser: 0.89 mg/dL (ref 0.57–1.00)
Globulin, Total: 2.4 g/dL (ref 1.5–4.5)
Glucose: 83 mg/dL (ref 65–99)
Potassium: 4.8 mmol/L (ref 3.5–5.2)
Sodium: 141 mmol/L (ref 134–144)
Total Protein: 6.8 g/dL (ref 6.0–8.5)
eGFR: 77 mL/min/{1.73_m2} (ref >59)

## 2021-05-30 LAB — VITAMIN B12: Vitamin B-12: 494 pg/mL (ref 232–1245)

## 2021-05-30 LAB — VITAMIN D 25 HYDROXY (VIT D DEFICIENCY, FRACTURES): Vit D, 25-Hydroxy: 74.3 ng/mL (ref 30.0–100.0)

## 2021-05-30 NOTE — Progress Notes (Signed)
Chief Complaint:   OBESITY Gina Washington is here to discuss her progress with her obesity treatment plan along with follow-up of her obesity related diagnoses. Youa is on the Category 1 Plan and states she is following her eating plan approximately 70% of the time. Abia states she is doing 0 minutes 0 times per week.  Today's visit was #: 9 Starting weight: 182 lbs Starting date: 11/16/2020 Today's weight: 171 lbs Today's date: 05/29/2021 Total lbs lost to date: 11 lbs Total lbs lost since last in-office visit: 4 lbs  Interim History: Gina Washington is down an additional 4 lbs and doing well overall. She is taking Saxenda 1.2 mg and switched to 1.8 mg yesterday.  Subjective:   1. Essential hypertension Review: taking medications as instructed, no medication side effects noted, no chest pain on exertion, no dyspnea on exertion, no swelling of ankles.   2. Vitamin D deficiency She is currently taking prescription vitamin D 50,000 IU each week. She denies nausea, vomiting or muscle weakness.  3. B12 deficiency Nina is currently taking B12.  4. Elevated cholesterol Medication(s) reviewed. Patient denies myalgias.   5. Elevated hemoglobin (HCC) Jayline's hemoglobin was elevated today.  6. At risk for heart disease Gina Washington is at risk for heart disease due to hypertension.  Assessment/Plan:   1. Essential hypertension Eknoor is working on healthy weight loss and exercise to improve blood pressure control. We will check labs. We will watch for signs of hypotension as she continues her lifestyle modifications.  - Comprehensive metabolic panel  2. Vitamin D deficiency Low Vitamin D level contributes to fatigue and are associated with obesity, breast, and colon cancer. Theresea agrees to continue to take prescription Vitamin D 50,000 IU every week and will follow-up for routine testing of Vitamin D, at least 2-3 times per year to avoid over-replacement. We will check  labs  today.  - VITAMIN D 25 Hydroxy (Vit-D Deficiency, Fractures)  3. B12 deficiency We will check B12 today.   - Vitamin B12  4. Elevated cholesterol Cardiovascular risk and specific lipid/LDL goals reviewed.  We discussed several lifestyle modifications today and Rifka will continue to work on diet, exercise and weight loss efforts. We will check labs today. Orders and follow up as documented in patient record.   Counseling Intensive lifestyle modifications are the first line treatment for this issue. Dietary changes: Increase soluble fiber. Decrease simple carbohydrates. Exercise changes: Moderate to vigorous-intensity aerobic activity 150 minutes per week if tolerated. Lipid-lowering medications: see documented in medical record.   - Lipid Panel With LDL/HDL Ratio  5. Elevated hemoglobin (HCC) We will check labs today.  - CBC with Differential/Platelet  6. At risk for heart disease Gina Washington was given approximately 15 minutes of coronary artery disease prevention counseling today. She is 55 y.o. female and has risk factors for heart disease including obesity. We discussed intensive lifestyle modifications today with an emphasis on specific weight loss instructions and strategies.   Repetitive spaced learning was employed today to elicit superior memory formation and behavioral change.   7. Obesity: current BMI 30.3 Gina Washington is currently in the action stage of change. As such, her goal is to continue with weight loss efforts. She has agreed to the Category 1 Plan.   Gina Washington will continue meal planning. She will adhere closely to the plan. She will continue Saxenda.  Exercise goals: No exercise has been prescribed at this time.  Behavioral modification strategies: increasing lean protein intake, decreasing simple carbohydrates, increasing vegetables, increasing  water intake, decreasing eating out, no skipping meals, meal planning and cooking strategies, keeping healthy foods in the  home, and planning for success.  Gina Washington has agreed to follow-up with our clinic in 2-3 weeks. She was informed of the importance of frequent follow-up visits to maximize her success with intensive lifestyle modifications for her multiple health conditions.   Joelle was informed we would discuss her lab results at her next visit unless there is a critical issue that needs to be addressed sooner. Biviana agreed to keep her next visit at the agreed upon time to discuss these results.  Objective:   Blood pressure 120/80, pulse 68, temperature 97.7 F (36.5 C), height '5\' 3"'$  (1.6 m), weight 171 lb (77.6 kg), SpO2 97 %. Body mass index is 30.29 kg/m.  General: Cooperative, alert, well developed, in no acute distress. HEENT: Conjunctivae and lids unremarkable. Cardiovascular: Regular rhythm.  Lungs: Normal work of breathing. Neurologic: No focal deficits.   Lab Results  Component Value Date   CREATININE 0.89 05/29/2021   BUN 10 05/29/2021   NA 141 05/29/2021   K 4.8 05/29/2021   CL 101 05/29/2021   CO2 24 05/29/2021   Lab Results  Component Value Date   ALT 18 05/29/2021   AST 14 05/29/2021   ALKPHOS 88 05/29/2021   BILITOT 0.3 05/29/2021   Lab Results  Component Value Date   HGBA1C 5.5 11/16/2020   Lab Results  Component Value Date   INSULIN 4.2 11/16/2020   Lab Results  Component Value Date   TSH 2.500 11/16/2020   Lab Results  Component Value Date   CHOL 211 (H) 05/29/2021   HDL 84 05/29/2021   LDLCALC 115 (H) 05/29/2021   TRIG 67 05/29/2021   CHOLHDL 2.2 11/16/2020   Lab Results  Component Value Date   VD25OH 74.3 05/29/2021   VD25OH 21.5 (L) 11/16/2020   Lab Results  Component Value Date   WBC 5.3 05/29/2021   HGB 15.4 05/29/2021   HCT 46.4 05/29/2021   MCV 99 (H) 05/29/2021   PLT 251 05/29/2021   Lab Results  Component Value Date   IRON 233 (H) 11/16/2020   TIBC 344 11/16/2020   FERRITIN 449 (H) 11/16/2020   Attestation Statements:    Reviewed by clinician on day of visit: allergies, medications, problem list, medical history, surgical history, family history, social history, and previous encounter notes.  I, Lizbeth Bark, RMA, am acting as Location manager for [employer], [place].   I have reviewed the above documentation for accuracy and completeness, and I agree with the above. Jearld Lesch, DO

## 2021-05-31 ENCOUNTER — Encounter (INDEPENDENT_AMBULATORY_CARE_PROVIDER_SITE_OTHER): Payer: Self-pay | Admitting: Bariatrics

## 2021-06-14 ENCOUNTER — Other Ambulatory Visit: Payer: Self-pay | Admitting: Psychiatry

## 2021-06-14 DIAGNOSIS — F3342 Major depressive disorder, recurrent, in full remission: Secondary | ICD-10-CM

## 2021-06-21 ENCOUNTER — Encounter (INDEPENDENT_AMBULATORY_CARE_PROVIDER_SITE_OTHER): Payer: Self-pay | Admitting: Family Medicine

## 2021-06-21 ENCOUNTER — Ambulatory Visit (INDEPENDENT_AMBULATORY_CARE_PROVIDER_SITE_OTHER): Payer: BC Managed Care – PPO | Admitting: Family Medicine

## 2021-06-21 ENCOUNTER — Other Ambulatory Visit: Payer: Self-pay

## 2021-06-21 VITALS — BP 106/72 | HR 68 | Temp 97.5°F | Ht 63.0 in | Wt 164.0 lb

## 2021-06-21 DIAGNOSIS — E669 Obesity, unspecified: Secondary | ICD-10-CM | POA: Diagnosis not present

## 2021-06-21 DIAGNOSIS — E559 Vitamin D deficiency, unspecified: Secondary | ICD-10-CM | POA: Diagnosis not present

## 2021-06-21 DIAGNOSIS — E7849 Other hyperlipidemia: Secondary | ICD-10-CM

## 2021-06-21 DIAGNOSIS — Z9189 Other specified personal risk factors, not elsewhere classified: Secondary | ICD-10-CM

## 2021-06-21 DIAGNOSIS — Z6832 Body mass index (BMI) 32.0-32.9, adult: Secondary | ICD-10-CM

## 2021-06-21 MED ORDER — SAXENDA 18 MG/3ML ~~LOC~~ SOPN: 3.0000 mg | PEN_INJECTOR | Freq: Every day | SUBCUTANEOUS | 0 refills | Status: DC

## 2021-06-21 MED ORDER — VITAMIN D 125 MCG (5000 UT) PO CAPS: 1.0000 | ORAL_CAPSULE | Freq: Every day | ORAL | 0 refills | Status: DC

## 2021-06-21 MED ORDER — BD PEN NEEDLE NANO 2ND GEN 32G X 4 MM MISC: 0 refills | Status: DC

## 2021-06-21 NOTE — Progress Notes (Signed)
The 10-year ASCVD risk score (Arnett DK, et al., 2019) is: 2.5%   Values used to calculate the score:     Age: 55 years     Sex: Female     Is Non-Hispanic African American: No     Diabetic: Yes     Tobacco smoker: No     Systolic Blood Pressure: A999333 mmHg     Is BP treated: Yes     HDL Cholesterol: 84 mg/dL     Total Cholesterol: 211 mg/dL

## 2021-06-21 NOTE — Progress Notes (Signed)
Chief Complaint:   OBESITY Gina Washington is here to discuss her progress with her obesity treatment plan along with follow-up of her obesity related diagnoses. Gina Washington is on the Category 1 Plan and states she is following her eating plan approximately 50% of the time. Gina Washington states she is doing 0 minutes 0 times per week.  Today's visit was #: 10 Starting weight: 182 lbs Starting date: 11/16/2020 Today's weight: 164 lbs Today's date: 06/21/2021 Total lbs lost to date: 18 lbs Total lbs lost since last in-office visit: -7  Interim History: Gina Washington is doing very well on plan. However she does not getting in lunch due to not having time- she is a Pharmacist, hospital.  Subjective:   1. Other hyperlipidemia Gina Washington's last LDL was elevated at 115. Her HDL very good at 84. Her Triglycerides were within normal limits. She has no family history of CAD.   Lab Results  Component Value Date   CHOL 211 (H) 05/29/2021   HDL 84 05/29/2021   LDLCALC 115 (H) 05/29/2021   TRIG 67 05/29/2021   CHOLHDL 2.2 11/16/2020   Lab Results  Component Value Date   ALT 18 05/29/2021   AST 14 05/29/2021   ALKPHOS 88 05/29/2021   BILITOT 0.3 05/29/2021   The 10-year ASCVD risk score (Arnett DK, et al., 2019) is: 2.5%   Values used to calculate the score:     Age: 55 years     Sex: Female     Is Non-Hispanic African American: No     Diabetic: Yes     Tobacco smoker: No     Systolic Blood Pressure: A999333 mmHg     Is BP treated: Yes     HDL Cholesterol: 84 mg/dL     Total Cholesterol: 211 mg/dL   2. Vitamin D deficiency Gina Washington's Vitamin D is now at goal (74.3). She is currently on prescription Vitamin D weekly.  Lab Results  Component Value Date   VD25OH 74.3 05/29/2021   VD25OH 21.5 (L) 11/16/2020    3. At risk for heart disease Gina Washington is at risk for heart disease due to HLD.  Assessment/Plan:   1. Other hyperlipidemia  No statin indicated. Meloni will continue meal plan. We discussed labs today.    2. Vitamin D deficiency  She agrees to discontinue prescription Vitamin D 50,000 IU every week and start OTC 5000 IU daily. She will follow-up for routine testing of Vitamin D, at least 2-3 times per year to avoid over-replacement. We discussed labs today.  - Cholecalciferol (VITAMIN D) 125 MCG (5000 UT) CAPS; Take 1 capsule by mouth daily.  Dispense: 30 capsule; Refill: 0  3. At risk for heart disease Gina Washington was given approximately 15 minutes of coronary artery disease prevention counseling today. She is 55 y.o. female and has risk factors for heart disease including obesity. We discussed intensive lifestyle modifications today with an emphasis on specific weight loss instructions and strategies.   Repetitive spaced learning was employed today to elicit superior memory formation and behavioral change.   4. Overweight: Current BMI 29.06 Gina Washington is currently in the action stage of change. As such, her goal is to continue with weight loss efforts. She has agreed to the Category 1 Plan.   Handouts: Protein content of Food.   We will refill Gina Washington's Saxenda 3.0 mg weekly. She is to take 2.4 or 3.0 mg daily. We will also refill Insulin Pen Needles.   - Liraglutide -Weight Management (SAXENDA) 18 MG/3ML SOPN; Inject 3  mg into the skin daily.  Dispense: 15 mL; Refill: 0  - Insulin Pen Needle (BD PEN NEEDLE NANO 2ND GEN) 32G X 4 MM MISC; Use 1 needle daily to inject Saxenda.  Dispense: 100 each; Refill: 0  Exercise goals: No exercise has been prescribed at this time.  Behavioral modification strategies: increasing lean protein intake and meal planning and cooking strategies.  Gina Washington has agreed to follow-up with our clinic in 3-4 weeks. She was informed of the importance of frequent follow-up visits to maximize her success with intensive lifestyle modifications for her multiple health conditions.   Objective:   Blood pressure 106/72, pulse 68, temperature (!) 97.5 F (36.4 C), height 5'  3" (1.6 m), weight 164 lb (74.4 kg), SpO2 95 %. Body mass index is 29.05 kg/m.  General: Cooperative, alert, well developed, in no acute distress. HEENT: Conjunctivae and lids unremarkable. Cardiovascular: Regular rhythm.  Lungs: Normal work of breathing. Neurologic: No focal deficits.   Lab Results  Component Value Date   CREATININE 0.89 05/29/2021   BUN 10 05/29/2021   NA 141 05/29/2021   K 4.8 05/29/2021   CL 101 05/29/2021   CO2 24 05/29/2021   Lab Results  Component Value Date   ALT 18 05/29/2021   AST 14 05/29/2021   ALKPHOS 88 05/29/2021   BILITOT 0.3 05/29/2021   Lab Results  Component Value Date   HGBA1C 5.5 11/16/2020   Lab Results  Component Value Date   INSULIN 4.2 11/16/2020   Lab Results  Component Value Date   TSH 2.500 11/16/2020   Lab Results  Component Value Date   CHOL 211 (H) 05/29/2021   HDL 84 05/29/2021   LDLCALC 115 (H) 05/29/2021   TRIG 67 05/29/2021   CHOLHDL 2.2 11/16/2020   Lab Results  Component Value Date   VD25OH 74.3 05/29/2021   VD25OH 21.5 (L) 11/16/2020   Lab Results  Component Value Date   WBC 5.3 05/29/2021   HGB 15.4 05/29/2021   HCT 46.4 05/29/2021   MCV 99 (H) 05/29/2021   PLT 251 05/29/2021   Lab Results  Component Value Date   IRON 233 (H) 11/16/2020   TIBC 344 11/16/2020   FERRITIN 449 (H) 11/16/2020    Attestation Statements:   Reviewed by clinician on day of visit: allergies, medications, problem list, medical history, surgical history, family history, social history, and previous encounter notes.   I, Gina Washington, RMA, am acting as Location manager for [employer], [place].   I have reviewed the above documentation for accuracy and completeness, and I agree with the above. -  Gina Fick, FNP

## 2021-06-23 DIAGNOSIS — E7849 Other hyperlipidemia: Secondary | ICD-10-CM | POA: Insufficient documentation

## 2021-07-12 ENCOUNTER — Ambulatory Visit (INDEPENDENT_AMBULATORY_CARE_PROVIDER_SITE_OTHER): Payer: BC Managed Care – PPO | Admitting: Family Medicine

## 2021-07-12 ENCOUNTER — Other Ambulatory Visit: Payer: Self-pay

## 2021-07-12 ENCOUNTER — Encounter (INDEPENDENT_AMBULATORY_CARE_PROVIDER_SITE_OTHER): Payer: Self-pay | Admitting: Family Medicine

## 2021-07-12 VITALS — BP 116/83 | HR 77 | Temp 97.8°F | Ht 63.0 in | Wt 166.0 lb

## 2021-07-12 DIAGNOSIS — Z6832 Body mass index (BMI) 32.0-32.9, adult: Secondary | ICD-10-CM | POA: Diagnosis not present

## 2021-07-12 DIAGNOSIS — E669 Obesity, unspecified: Secondary | ICD-10-CM

## 2021-07-12 DIAGNOSIS — K5900 Constipation, unspecified: Secondary | ICD-10-CM

## 2021-07-12 NOTE — Progress Notes (Signed)
Chief Complaint:   OBESITY Gina Washington is here to discuss her progress with her obesity treatment plan along with follow-up of her obesity related diagnoses. Gina Washington is on the Category 1 Plan and states she is following her eating plan approximately 40% of the time. Gina Washington states she is counting steps 10,000-15,000 daily 5 times per week.  Today's visit was #: 11 Starting weight: 182 lbs Starting date: 11/16/2020 Today's weight: 166 lbs Today's date: 07/12/2021 Total lbs lost to date: 16 lbs Total lbs lost since last in-office visit: 0  Interim History: Gina Washington is on Saxenda 3.0 mg daily. Her appetite is well controlled. She is having a protein bar at lunch. She has no time for lunch or drinking water at school. 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 She gets blue cheese dressing on her side salad. She also has 1 glass of wine per day. She is not tracking snack calories and is exceeding 100 snack calories/day. Her weight goal is 150-155 lbs (26-27 BMI).   Subjective:   1. Constipation, unspecified constipation type Gina Washington has been on Miralax for constipation but ran out 5 days ago. She notes constipation.  Assessment/Plan:   1. Constipation, unspecified constipation type . Gina Washington will restart Miralax and increase water intake. Orders and follow up as documented in patient record.   2. Overweight: Current BMI 29.41 Gina Washington is currently in the action stage of change. As such, her goal is to continue with weight loss efforts. She has agreed to the Category 1 Plan.   Gina Washington will keep extra calories < than 100 per day. She will continue Saxenda 3.0 mg daily.  Exercise goals:  As is.  Behavioral modification strategies: decreasing simple carbohydrates, increasing water intake, decreasing alcohol intake, and planning for success.  Gina Washington has agreed to follow-up with our clinic in 3-4 weeks.  Objective:   Blood pressure 116/83, pulse 77, temperature 97.8 F  (36.6 C), height 5\' 3"  (1.6 m), weight 166 lb (75.3 kg), SpO2 97 %. Body mass index is 29.41 kg/m.  General: Cooperative, alert, well developed, in no acute distress. HEENT: Conjunctivae and lids unremarkable. Cardiovascular: Regular rhythm.  Lungs: Normal work of breathing. Neurologic: No focal deficits.   Lab Results  Component Value Date   CREATININE 0.89 05/29/2021   BUN 10 05/29/2021   NA 141 05/29/2021   K 4.8 05/29/2021   CL 101 05/29/2021   CO2 24 05/29/2021   Lab Results  Component Value Date   ALT 18 05/29/2021   AST 14 05/29/2021   ALKPHOS 88 05/29/2021   BILITOT 0.3 05/29/2021   Lab Results  Component Value Date   HGBA1C 5.5 11/16/2020   Lab Results  Component Value Date   INSULIN 4.2 11/16/2020   Lab Results  Component Value Date   TSH 2.500 11/16/2020   Lab Results  Component Value Date   CHOL 211 (H) 05/29/2021   HDL 84 05/29/2021   LDLCALC 115 (H) 05/29/2021   TRIG 67 05/29/2021   CHOLHDL 2.2 11/16/2020   Lab Results  Component Value Date   VD25OH 74.3 05/29/2021   VD25OH 21.5 (L) 11/16/2020   Lab Results  Component Value Date   WBC 5.3 05/29/2021   HGB 15.4 05/29/2021   HCT 46.4 05/29/2021   MCV 99 (H) 05/29/2021   PLT 251 05/29/2021   Lab Results  Component Value Date   IRON 233 (H) 11/16/2020   TIBC 344 11/16/2020   FERRITIN 449 (H) 11/16/2020  Attestation Statements:   Reviewed by clinician on day of visit: allergies, medications, problem list, medical history, surgical history, family history, social history, and previous encounter notes.  I, Lizbeth Bark, RMA, am acting as Location manager for [employer], [place].   I have reviewed the above documentation for accuracy and completeness, and I agree with the above. -  Georgianne Fick, FNP

## 2021-07-24 ENCOUNTER — Encounter (INDEPENDENT_AMBULATORY_CARE_PROVIDER_SITE_OTHER): Payer: Self-pay

## 2021-07-25 ENCOUNTER — Other Ambulatory Visit (INDEPENDENT_AMBULATORY_CARE_PROVIDER_SITE_OTHER): Payer: Self-pay | Admitting: Family Medicine

## 2021-07-25 DIAGNOSIS — E669 Obesity, unspecified: Secondary | ICD-10-CM

## 2021-08-06 ENCOUNTER — Ambulatory Visit: Payer: BC Managed Care – PPO | Admitting: Neurology

## 2021-08-07 ENCOUNTER — Other Ambulatory Visit: Payer: Self-pay

## 2021-08-07 ENCOUNTER — Ambulatory Visit (INDEPENDENT_AMBULATORY_CARE_PROVIDER_SITE_OTHER): Payer: BC Managed Care – PPO | Admitting: Family Medicine

## 2021-08-07 ENCOUNTER — Encounter (INDEPENDENT_AMBULATORY_CARE_PROVIDER_SITE_OTHER): Payer: Self-pay | Admitting: Family Medicine

## 2021-08-07 VITALS — BP 120/86 | HR 79 | Temp 98.1°F | Ht 63.0 in | Wt 162.0 lb

## 2021-08-07 DIAGNOSIS — E669 Obesity, unspecified: Secondary | ICD-10-CM | POA: Diagnosis not present

## 2021-08-07 DIAGNOSIS — E7849 Other hyperlipidemia: Secondary | ICD-10-CM | POA: Diagnosis not present

## 2021-08-07 DIAGNOSIS — Z9189 Other specified personal risk factors, not elsewhere classified: Secondary | ICD-10-CM

## 2021-08-07 DIAGNOSIS — Z6832 Body mass index (BMI) 32.0-32.9, adult: Secondary | ICD-10-CM

## 2021-08-07 DIAGNOSIS — R632 Polyphagia: Secondary | ICD-10-CM | POA: Diagnosis not present

## 2021-08-07 MED ORDER — TIRZEPATIDE 10 MG/0.5ML ~~LOC~~ SOAJ: 10.0000 mg | SUBCUTANEOUS | 0 refills | Status: DC

## 2021-08-07 NOTE — Progress Notes (Signed)
The 10-year ASCVD risk score (Arnett DK, et al., 2019) is: 3.2%   Values used to calculate the score:     Age: 55 years     Sex: Female     Is Non-Hispanic African American: No     Diabetic: Yes     Tobacco smoker: No     Systolic Blood Pressure: 102 mmHg     Is BP treated: Yes     HDL Cholesterol: 84 mg/dL     Total Cholesterol: 211 mg/dL

## 2021-08-08 ENCOUNTER — Encounter (INDEPENDENT_AMBULATORY_CARE_PROVIDER_SITE_OTHER): Payer: Self-pay | Admitting: Family Medicine

## 2021-08-08 NOTE — Progress Notes (Signed)
Chief Complaint:   OBESITY Gina Washington is here to discuss her progress with her obesity treatment plan along with follow-up of her obesity related diagnoses. Gina Washington is on the Category 1 Plan and states she is following her eating plan approximately 60% of the time. Gina Washington states she is not exercising regularly.  Today's visit was #: 12 Starting weight: 182 lbs Starting date: 11/16/2020 Today's weight: 162 lbs Today's date: 08/07/2021 Total lbs lost to date: 20 lbs Total lbs lost since last in-office visit: 4 lbs  Interim History: Gina Washington notices hunger has increased lately.  She is getting in more water and food while at work.  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  She will start PT soon.  She has issues with balance and flexibility.  Subjective:   1. Polyphagia On Saxenda.  Appetite has increased recently.  2. Other hyperlipidemia LDL elevated at 115.  Triglycerides and HDL within normal limits.  She is not on a statin.  Her 10-year ASCVD risk score is 3.2%.  Lab Results  Component Value Date   ALT 18 05/29/2021   AST 14 05/29/2021   ALKPHOS 88 05/29/2021   BILITOT 0.3 05/29/2021   Lab Results  Component Value Date   CHOL 211 (H) 05/29/2021   HDL 84 05/29/2021   LDLCALC 115 (H) 05/29/2021   TRIG 67 05/29/2021   CHOLHDL 2.2 11/16/2020   3. At risk for side effect of medication Gina Washington is at risk for side effect of medication due to starting Gina Washington.  Assessment/Plan:   1. Polyphagia Discontinue Saxenda. Start Mounjaro 10 mg subcutaneously weekly, as per below.  - Start tirzepatide [facility]) 10 MG/0.5ML Pen; Inject 10 mg into the skin once a week.  Dispense: 6 mL; Refill: 0  2. Other hyperlipidemia Continue meal plan. Increase activity.  3. At risk for side effect of medication Gina Washington was given approximately 15 minutes of drug side effect counseling today.  We discussed side effect possibility and risk versus benefits. Gina Washington  agreed to the medication and will contact this office if these side effects are intolerable.  Repetitive spaced learning was employed today to elicit superior memory formation and behavioral change.   4. Overweight: Current BMI 28.7  Gina Washington is currently in the action stage of change. As such, her goal is to continue with weight loss efforts. She has agreed to the Category 1 Plan.   Exercise goals: No exercise has been prescribed at this time.  Behavioral modification strategies: increasing lean protein intake.  Gina Washington has agreed to follow-up with our clinic in 3-4 weeks.  Objective:   Blood pressure 120/86, pulse 79, temperature 98.1 F (36.7 C), height 5\' 3"  (1.6 m), weight 162 lb (73.5 kg), SpO2 98 %. Body mass index is 28.7 kg/m.  General: Cooperative, alert, well developed, in no acute distress. HEENT: Conjunctivae and lids unremarkable. Cardiovascular: Regular rhythm.  Lungs: Normal work of breathing. Neurologic: No focal deficits.   Lab Results  Component Value Date   CREATININE 0.89 05/29/2021   BUN 10 05/29/2021   NA 141 05/29/2021   K 4.8 05/29/2021   CL 101 05/29/2021   CO2 24 05/29/2021   Lab Results  Component Value Date   ALT 18 05/29/2021   AST 14 05/29/2021   ALKPHOS 88 05/29/2021   BILITOT 0.3 05/29/2021   Lab Results  Component Value Date   HGBA1C 5.5 11/16/2020   Lab Results  Component Value Date   INSULIN 4.2  11/16/2020   Lab Results  Component Value Date   TSH 2.500 11/16/2020   Lab Results  Component Value Date   CHOL 211 (H) 05/29/2021   HDL 84 05/29/2021   LDLCALC 115 (H) 05/29/2021   TRIG 67 05/29/2021   CHOLHDL 2.2 11/16/2020   Lab Results  Component Value Date   VD25OH 74.3 05/29/2021   VD25OH 21.5 (L) 11/16/2020   Lab Results  Component Value Date   WBC 5.3 05/29/2021   HGB 15.4 05/29/2021   HCT 46.4 05/29/2021   MCV 99 (H) 05/29/2021   PLT 251 05/29/2021   Lab Results  Component Value Date   IRON 233 (H)  11/16/2020   TIBC 344 11/16/2020   FERRITIN 449 (H) 11/16/2020   Attestation Statements:   Reviewed by clinician on day of visit: allergies, medications, problem list, medical history, surgical history, family history, social history, and previous encounter notes.  I, Water quality scientist, CMA, am acting as Location manager for [employer], [place].  I have reviewed the above documentation for accuracy and completeness, and I agree with the above. -  Georgianne Fick, FNP

## 2021-08-16 ENCOUNTER — Encounter (INDEPENDENT_AMBULATORY_CARE_PROVIDER_SITE_OTHER): Payer: Self-pay

## 2021-08-23 ENCOUNTER — Encounter: Payer: Self-pay | Admitting: Psychiatry

## 2021-08-23 ENCOUNTER — Other Ambulatory Visit: Payer: Self-pay

## 2021-08-23 ENCOUNTER — Ambulatory Visit: Payer: BC Managed Care – PPO | Admitting: Psychiatry

## 2021-08-23 DIAGNOSIS — F902 Attention-deficit hyperactivity disorder, combined type: Secondary | ICD-10-CM | POA: Diagnosis not present

## 2021-08-23 DIAGNOSIS — F3342 Major depressive disorder, recurrent, in full remission: Secondary | ICD-10-CM | POA: Diagnosis not present

## 2021-08-23 MED ORDER — LISDEXAMFETAMINE DIMESYLATE 50 MG PO CAPS: 50.0000 mg | ORAL_CAPSULE | Freq: Every day | ORAL | 0 refills | Status: DC

## 2021-08-23 MED ORDER — BUPROPION HCL ER (XL) 150 MG PO TB24: 150.0000 mg | ORAL_TABLET | Freq: Every day | ORAL | 1 refills | Status: DC

## 2021-08-23 MED ORDER — LISDEXAMFETAMINE DIMESYLATE 50 MG PO CAPS: ORAL_CAPSULE | ORAL | 0 refills | Status: DC

## 2021-08-23 NOTE — Progress Notes (Signed)
Gina Washington [id number] 02/20/1966 55 y.o.  Subjective:   Patient ID:  Gina Washington is a 55 y.o. (DOB 1966/06/09) female.  Chief Complaint:  Chief Complaint  Patient presents with   Follow-up    ADHD, Depression, Anxiety     HPI Gina Washington presents to the office today for follow-up of ADHD, depression, and anxiety. Mood has been "good." She questions if she may be experiencing some menopausal s/s. She reports that she is occ "quick tempered." Has been intentionally losing weight. Has changed food intake and increased activity level. Appetite has been lower. Has not wanted to clean and has hired someone to clean. Energy and motivation is ok overall. Has not had any recent anxiety. She reports that functional movement s/s have been severe and is considering resuming OT and PT. Has not been sleeping normally. Has to awaken to urinate during the night. Waking up a few nights a week thinking about different things, such as what she needs to do at work. Concentration has been adequate. Denies SI.   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   [facility] Office Visit from 11/16/2020 in [place]  PHQ-2 Total Score 4  PHQ-9 Total Score 14        Review of Systems:  Review of Systems  Cardiovascular:  Negative for palpitations.  Musculoskeletal:  Negative for gait problem.  Neurological:  Positive for tremors.  Psychiatric/Behavioral:         Please refer to HPI   Medications: I have reviewed the patient's current medications.  Current Outpatient Medications  Medication Sig Dispense Refill   amLODipine (NORVASC) 5 MG tablet Take 5 mg by mouth daily.     Cholecalciferol (VITAMIN D) 125 MCG (5000 UT) CAPS Take 1 capsule by mouth daily. 30 capsule 0   cyanocobalamin (,VITAMIN B-12,) 1000 MCG/ML injection Inject 1,000 mcg into the muscle every 28 (twenty-eight) days.     metoprolol succinate (TOPROL-XL) 100 MG 24 hr tablet Take  100 mg by mouth daily.     omeprazole (PRILOSEC) 20 MG capsule Take 20 mg by mouth daily.     tirzepatide (MOUNJARO) 10 MG/0.5ML Pen Inject 10 mg into the skin once a week. 6 mL 0   valsartan (DIOVAN) 160 MG tablet Take 160 mg by mouth daily.     buPROPion (WELLBUTRIN XL) 150 MG 24 hr tablet Take 1 tablet (150 mg total) by mouth daily. 90 tablet 1   [START ON 10/22/2021] lisdexamfetamine (VYVANSE) 50 MG capsule TAKE 1 CAPSULE BY MOUTH EVERY DAY 30 capsule 0   [START ON 09/24/2021] lisdexamfetamine (VYVANSE) 50 MG capsule Take 1 capsule (50 mg total) by mouth daily. 30 capsule 0   [START ON 08/27/2021] lisdexamfetamine (VYVANSE) 50 MG capsule Take 1 capsule (50 mg total) by mouth daily. 30 capsule 0   No current facility-administered medications for this visit.    Medication Side Effects: None  Allergies:  Allergies  Allergen Reactions   Bactrim [Sulfamethoxazole-Trimethoprim] Hives   Codeine Nausea And Vomiting   Latex Hives    Past Medical History:  Diagnosis Date   ADHD    Anxiety    B12 deficiency    Back pain    Basal cell carcinoma    leg   Basal cell carcinoma    Constipation    Functional movement disorder    GERD (gastroesophageal reflux disease)    Herpes zoster without complication    Hypertension  Kidney problem    Lower extremity edema    Migraine    Neurological movement disorder    Vaginal Pap smear, abnormal    age 29 yr- cryo done    Past Medical History, Surgical history, Social history, and Family history were reviewed and updated as appropriate.   Please see review of systems for further details on the patient's review from today.   Objective:   Physical Exam:  BP 138/85   Pulse 78   Wt 158 lb (71.7 kg)   BMI 27.99 kg/m   Physical Exam Constitutional:      General: She is not in acute distress. Musculoskeletal:        General: No deformity.  Neurological:     Mental Status: She is alert and oriented to person, place, and time.      Coordination: Coordination normal.  Psychiatric:        Attention and Perception: Attention and perception normal. She does not perceive auditory or visual hallucinations.        Mood and Affect: Mood normal. Mood is not anxious or depressed. Affect is not labile, blunt, angry or inappropriate.        Speech: Speech normal.        Behavior: Behavior normal.        Thought Content: Thought content normal. Thought content is not paranoid or delusional. Thought content does not include homicidal or suicidal ideation. Thought content does not include homicidal or suicidal plan.        Cognition and Memory: Cognition and memory normal.        Judgment: Judgment normal.     Comments: Insight intact    Lab Review:     Component Value Date/Time   NA 141 05/29/2021 1022   K 4.8 05/29/2021 1022   CL 101 05/29/2021 1022   CO2 24 05/29/2021 1022   GLUCOSE 83 05/29/2021 1022   BUN 10 05/29/2021 1022   CREATININE 0.89 05/29/2021 1022   CALCIUM 9.6 05/29/2021 1022   PROT 6.8 05/29/2021 1022   ALBUMIN 4.4 05/29/2021 1022   AST 14 05/29/2021 1022   ALT 18 05/29/2021 1022   ALKPHOS 88 05/29/2021 1022   BILITOT 0.3 05/29/2021 1022   GFRNONAA 67 11/16/2020 1005   GFRAA 77 11/16/2020 1005       Component Value Date/Time   WBC 5.3 05/29/2021 1022   RBC 4.70 05/29/2021 1022   HGB 15.4 05/29/2021 1022   HCT 46.4 05/29/2021 1022   PLT 251 05/29/2021 1022   MCV 99 (H) 05/29/2021 1022   MCH 32.8 05/29/2021 1022   MCHC 33.2 05/29/2021 1022   RDW 13.0 05/29/2021 1022   LYMPHSABS 1.8 05/29/2021 1022   EOSABS 0.1 05/29/2021 1022   BASOSABS 0.0 05/29/2021 1022    No results found for: POCLITH, LITHIUM   No results found for: PHENYTOIN, PHENOBARB, VALPROATE, CBMZ   .res Assessment: Plan:   Continue Vyvanse 50 mg po qd for ADHD.  Continue Wellbutrin XL 150 mg po qd for depression.  Pt to follow-up in 6 months or sooner if clinically indicated.  Requested pt call in 3 months to provide update  and request additional scripts.  Patient advised to contact office with any questions, adverse effects, or acute worsening in signs and symptoms.   Gina Washington was seen today for follow-up.  Diagnoses and all orders for this visit:  Attention deficit hyperactivity disorder, combined type -     lisdexamfetamine (VYVANSE) 50 MG capsule; TAKE 1  CAPSULE BY MOUTH EVERY DAY -     lisdexamfetamine (VYVANSE) 50 MG capsule; Take 1 capsule (50 mg total) by mouth daily. -     lisdexamfetamine (VYVANSE) 50 MG capsule; Take 1 capsule (50 mg total) by mouth daily.  Recurrent major depressive disorder, in full remission (Christmas) -     buPROPion (WELLBUTRIN XL) 150 MG 24 hr tablet; Take 1 tablet (150 mg total) by mouth daily.    Please see After Visit Summary for patient specific instructions.  Future Appointments  Date Time Provider [place]  08/27/2021  3:45 PM [place], Joneen Boers, FNP [facility] None  11/19/2021 11:30 AM Marcial Pacas, MD [facility] None  02/21/2022  3:30 PM Thayer Headings, PMHNP [facility] None    No orders of the defined types were placed in this encounter.   -------------------------------

## 2021-08-27 ENCOUNTER — Ambulatory Visit (INDEPENDENT_AMBULATORY_CARE_PROVIDER_SITE_OTHER): Payer: BC Managed Care – PPO | Admitting: Family Medicine

## 2021-08-29 ENCOUNTER — Ambulatory Visit (INDEPENDENT_AMBULATORY_CARE_PROVIDER_SITE_OTHER): Payer: BC Managed Care – PPO | Admitting: Family Medicine

## 2021-09-10 ENCOUNTER — Ambulatory Visit (INDEPENDENT_AMBULATORY_CARE_PROVIDER_SITE_OTHER): Payer: BC Managed Care – PPO | Admitting: Adult Health

## 2021-09-10 ENCOUNTER — Encounter (INDEPENDENT_AMBULATORY_CARE_PROVIDER_SITE_OTHER): Payer: Self-pay | Admitting: Adult Health

## 2021-09-10 ENCOUNTER — Other Ambulatory Visit: Payer: Self-pay

## 2021-09-10 VITALS — BP 111/68 | HR 88 | Temp 97.4°F | Ht 63.0 in | Wt 150.0 lb

## 2021-09-10 DIAGNOSIS — R251 Tremor, unspecified: Secondary | ICD-10-CM

## 2021-09-10 DIAGNOSIS — Z6832 Body mass index (BMI) 32.0-32.9, adult: Secondary | ICD-10-CM

## 2021-09-10 DIAGNOSIS — E669 Obesity, unspecified: Secondary | ICD-10-CM | POA: Diagnosis not present

## 2021-09-10 DIAGNOSIS — I10 Essential (primary) hypertension: Secondary | ICD-10-CM | POA: Diagnosis not present

## 2021-09-10 DIAGNOSIS — R632 Polyphagia: Secondary | ICD-10-CM | POA: Diagnosis not present

## 2021-09-10 DIAGNOSIS — Z9189 Other specified personal risk factors, not elsewhere classified: Secondary | ICD-10-CM

## 2021-09-10 MED ORDER — TIRZEPATIDE 10 MG/0.5ML ~~LOC~~ SOAJ: 10.0000 mg | SUBCUTANEOUS | 0 refills | Status: DC

## 2021-09-11 NOTE — Progress Notes (Signed)
Chief Complaint:   OBESITY Gina Washington is here to discuss her progress with her obesity treatment plan along with follow-up of her obesity related diagnoses. Gina Washington is on the Category 1 Plan and states she is following her eating plan approximately 80% of the time. Gina Washington states she is not exercising at this time.  Today's visit was #: 58 Starting weight: 182 lbs Starting date: 11/16/2020 Today's weight: 150 lbs Today's date: 09/10/2021 Total lbs lost to date: 32 lbs Total lbs lost since last in-office visit: 12 lbs  Interim History:  Saxenda 3 mg d/c'd and was started on Mounjaro 10 mg at last office visit.   She has had 4 doses at this strength.  Denies mass in neck, dysphagia.   She has had experienced dysphagia or nausea without vomiting.  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  She has been evaluated by 5 neurologists- unable to determine cause. She reports myriad of neurological sx's- Antalgic gait, imbalance, new onset tremor of R hand.  Goal weight - 140-145 pounds and to wear consistent size 10 in clothing, current weight 150 pounds,   Subjective:   1. Polyphagia Saxenda 3 mg d/c'd and was started on Mounjaro 10 mg at last office visit.   She has had 4 doses at this strength.  Denies mass in neck, dysphagia.   She has had experienced dysphagia or nausea without vomiting.  2. HTN (hypertension), benign She is on amlodipine 5 mg daily, metoprolol succinate 100 mg daily, valsartan 100 mg daily.   She denies dizziness with position change or increased fatigue. 05/17/21 PCP note- I told her if the blood pressure gets less than 110 to go ahead and back off the metoprolol to just 50 mg daily.  3. Tremor of right hand She has developed essential tremor in her right hand. Developed anosmia in 2010 - been evaluated by 5 separate neurologists without explanation for cause.  She has imbalance/gait issues.  In formal PT currently.  She denies family history of known neurological  or autoimmune disorders.  4. At risk for constipation Gina Washington is at increased risk for constipation due to starting [facility].  Assessment/Plan:   1. Polyphagia Refill Mounjaro 10 mg once weekly, as per below.  - Refill tirzepatide (MOUNJARO) 10 MG/0.5ML Pen; Inject 10 mg into the skin once a week.  Dispense: 6 mL; Refill: 0  2. HTN (hypertension), benign Monitor for symptoms of hypotension - log provided to patient - bring to follow-up office visit.   Continue current antihypertensive therapy. If SBP trending <110 and hypotensive sx's develop- will reduce Metoprolol from 142m to 512mQD.  3. Tremor of right hand Referral to Dr. JaSondra Comeeurology placed.  - Ambulatory referral to Neurology  4. At risk for constipation Gina Washington was given approximately 15 minutes of counseling today regarding prevention of constipation. She was encouraged to increase water and fiber intake.   5. Overweight: Current BMI 28.7  Gina Washington is currently in the action stage of change. As such, her goal is to continue with weight loss efforts. She has agreed to the Category 1 Plan.   Exercise goals: PT - 1 day per week to assist with gait/strength/flexibiltybalance.  Behavioral modification strategies: increasing lean protein intake, decreasing simple carbohydrates, meal planning and cooking strategies, keeping healthy foods in the home, and planning for success.  Gina Washington has agreed to follow-up with our clinic in 3 weeks. She was informed of the importance of frequent follow-up visits to maximize her success with  intensive lifestyle modifications for her multiple health conditions.   Objective:   Blood pressure 111/68, pulse 88, temperature (!) 97.4 F (36.3 C), height '5\' 3"'  (1.6 m), weight 150 lb (68 kg), SpO2 100 %. Body mass index is 26.57 kg/m.  General: Cooperative, alert, well developed, in no acute distress. HEENT: Conjunctivae and lids unremarkable. Cardiovascular: Regular rhythm.   Lungs: Normal work of breathing. Neurologic: No focal deficits.   Lab Results  Component Value Date   CREATININE 0.89 05/29/2021   BUN 10 05/29/2021   NA 141 05/29/2021   K 4.8 05/29/2021   CL 101 05/29/2021   CO2 24 05/29/2021   Lab Results  Component Value Date   ALT 18 05/29/2021   AST 14 05/29/2021   ALKPHOS 88 05/29/2021   BILITOT 0.3 05/29/2021   Lab Results  Component Value Date   HGBA1C 5.5 11/16/2020   Lab Results  Component Value Date   INSULIN 4.2 11/16/2020   Lab Results  Component Value Date   TSH 2.500 11/16/2020   Lab Results  Component Value Date   CHOL 211 (H) 05/29/2021   HDL 84 05/29/2021   LDLCALC 115 (H) 05/29/2021   TRIG 67 05/29/2021   CHOLHDL 2.2 11/16/2020   Lab Results  Component Value Date   VD25OH 74.3 05/29/2021   VD25OH 21.5 (L) 11/16/2020   Lab Results  Component Value Date   WBC 5.3 05/29/2021   HGB 15.4 05/29/2021   HCT 46.4 05/29/2021   MCV 99 (H) 05/29/2021   PLT 251 05/29/2021   Lab Results  Component Value Date   IRON 233 (H) 11/16/2020   TIBC 344 11/16/2020   FERRITIN 449 (H) 11/16/2020   Attestation Statements:   Reviewed by clinician on day of visit: allergies, medications, problem list, medical history, surgical history, family history, social history, and previous encounter notes.  I, Water quality scientist, CMA, am acting as Location manager for Mina Marble, NP.  I have reviewed the above documentation for accuracy and completeness, and I agree with the above. - Punam Broussard d. Dachelle Molzahn, NP-C

## 2021-09-17 ENCOUNTER — Telehealth: Payer: Self-pay | Admitting: Neurology

## 2021-09-17 DIAGNOSIS — R29898 Other symptoms and signs involving the musculoskeletal system: Secondary | ICD-10-CM

## 2021-09-17 NOTE — Telephone Encounter (Signed)
Orders Placed This Encounter  Procedures   Ambulatory referral to Physical Therapy   Ambulatory referral to Occupational Therapy    

## 2021-09-17 NOTE — Addendum Note (Signed)
Addended by: Marcial Pacas on: 09/17/2021 05:56 PM   Modules accepted: Orders

## 2021-09-17 NOTE — Telephone Encounter (Signed)
Pt called, was told by rehabilitation referral for physical and occupational therapy has expired for October. Would like a call from the nurse to discuss sending another referral.

## 2021-09-17 NOTE — Telephone Encounter (Signed)
I spoke to the patient. She is aware that new orders have been placed to extend her PT and OT.

## 2021-09-20 ENCOUNTER — Encounter (INDEPENDENT_AMBULATORY_CARE_PROVIDER_SITE_OTHER): Payer: Self-pay | Admitting: Adult Health

## 2021-09-24 NOTE — Telephone Encounter (Signed)
Pt last seen by Katy Danford, FNP.  

## 2021-10-02 ENCOUNTER — Ambulatory Visit (INDEPENDENT_AMBULATORY_CARE_PROVIDER_SITE_OTHER): Payer: BC Managed Care – PPO | Admitting: Adult Health

## 2021-10-02 ENCOUNTER — Other Ambulatory Visit: Payer: Self-pay

## 2021-10-02 ENCOUNTER — Encounter (INDEPENDENT_AMBULATORY_CARE_PROVIDER_SITE_OTHER): Payer: Self-pay | Admitting: Adult Health

## 2021-10-02 VITALS — BP 103/64 | HR 70 | Temp 97.8°F | Ht 63.0 in | Wt 146.0 lb

## 2021-10-02 DIAGNOSIS — Z9189 Other specified personal risk factors, not elsewhere classified: Secondary | ICD-10-CM | POA: Diagnosis not present

## 2021-10-02 DIAGNOSIS — E669 Obesity, unspecified: Secondary | ICD-10-CM | POA: Diagnosis not present

## 2021-10-02 DIAGNOSIS — Z6832 Body mass index (BMI) 32.0-32.9, adult: Secondary | ICD-10-CM

## 2021-10-02 DIAGNOSIS — R632 Polyphagia: Secondary | ICD-10-CM | POA: Diagnosis not present

## 2021-10-02 MED ORDER — TIRZEPATIDE 10 MG/0.5ML ~~LOC~~ SOAJ
10.0000 mg | SUBCUTANEOUS | 0 refills | Status: DC
Start: 2021-10-02 — End: 2021-10-29

## 2021-10-03 ENCOUNTER — Encounter: Payer: Self-pay | Admitting: Occupational Therapy

## 2021-10-03 ENCOUNTER — Ambulatory Visit: Payer: BC Managed Care – PPO | Attending: Neurology | Admitting: Occupational Therapy

## 2021-10-03 DIAGNOSIS — M25621 Stiffness of right elbow, not elsewhere classified: Secondary | ICD-10-CM | POA: Insufficient documentation

## 2021-10-03 DIAGNOSIS — R278 Other lack of coordination: Secondary | ICD-10-CM | POA: Diagnosis present

## 2021-10-03 DIAGNOSIS — M25611 Stiffness of right shoulder, not elsewhere classified: Secondary | ICD-10-CM | POA: Diagnosis present

## 2021-10-03 DIAGNOSIS — M25641 Stiffness of right hand, not elsewhere classified: Secondary | ICD-10-CM | POA: Diagnosis not present

## 2021-10-03 DIAGNOSIS — M6281 Muscle weakness (generalized): Secondary | ICD-10-CM | POA: Diagnosis present

## 2021-10-03 NOTE — Progress Notes (Signed)
Chief Complaint:   OBESITY Gina Washington is here to discuss her progress with her obesity treatment plan along with follow-up of her obesity related diagnoses. Gina Washington is on the Category 1 Plan and states she is following her eating plan approximately 60% of the time. Gina Washington states she is walking at work.  Today's visit was #: 14 Starting weight: 182 lbs Starting date: 11/16/2020 Today's weight: 146 lbs Today's date: 10/02/2021 Total lbs lost to date: 36 lbs Total lbs lost since last in-office visit: 4 lbs  Interim History:  Gina Washington was previously on Saxenda 3 mg - converted to Mounjaro 10 mg on 08/07/2021. Down another 4 pounds for a total of 36 pounds. Current weight 146 pounds,goal weight 140-145 lbs- almost there!  Subjective:   1. Polyphagia Gina Washington was previously on Saxenda 3 mg - converted to Mounjaro 10 mg on 08/07/2021. She denies mass in neck, dysphagia, dyspepsia, GI upset. She will experience intermittent hoarseness, however this did also occur with max dose Saxenda.  2. At risk for nausea Gina Washington is at risk for nausea due to taking Mounjaro for polyphagia.  Assessment/Plan:   1. Polyphagia Refill Mounjaro 10 mg once weekly, as per below.  - Refill tirzepatide (MOUNJARO) 10 MG/0.5ML Pen; Inject 10 mg into the skin once a week.  Dispense: 6 mL; Refill: 0  2. At risk for nausea Gina Washington was given approximately 15 minutes of nausea prevention counseling today. Gina Washington is at risk for nausea due to her new or current medication. She was encouraged to titrate her medication slowly, make sure to stay hydrated, eat smaller portions throughout the day, and avoid high fat meals.   3. Overweight: Current BMI 26.0  Gina Washington is currently in the action stage of change. As such, her goal is to continue with weight loss efforts. She has agreed to the Category 1 Plan.   Exercise goals:  As is.  Behavioral modification strategies: increasing lean protein intake, decreasing  simple carbohydrates, meal planning and cooking strategies, keeping healthy foods in the home, and planning for success.  Gina Washington has agreed to follow-up with our clinic in 4 weeks. She was informed of the importance of frequent follow-up visits to maximize her success with intensive lifestyle modifications for her multiple health conditions.   Objective:   Blood pressure 103/64, pulse 70, temperature 97.8 F (36.6 C), height 5\' 3"  (1.6 m), weight 146 lb (66.2 kg), SpO2 98 %. Body mass index is 25.86 kg/m.  General: Cooperative, alert, well developed, in no acute distress. HEENT: Conjunctivae and lids unremarkable. Cardiovascular: Regular rhythm.  Lungs: Normal work of breathing. Neurologic: No focal deficits.   Lab Results  Component Value Date   CREATININE 0.89 05/29/2021   BUN 10 05/29/2021   NA 141 05/29/2021   K 4.8 05/29/2021   CL 101 05/29/2021   CO2 24 05/29/2021   Lab Results  Component Value Date   ALT 18 05/29/2021   AST 14 05/29/2021   ALKPHOS 88 05/29/2021   BILITOT 0.3 05/29/2021   Lab Results  Component Value Date   HGBA1C 5.5 11/16/2020   Lab Results  Component Value Date   INSULIN 4.2 11/16/2020   Lab Results  Component Value Date   TSH 2.500 11/16/2020   Lab Results  Component Value Date   CHOL 211 (H) 05/29/2021   HDL 84 05/29/2021   LDLCALC 115 (H) 05/29/2021   TRIG 67 05/29/2021   CHOLHDL 2.2 11/16/2020   Lab Results  Component Value Date  VD25OH 74.3 05/29/2021   VD25OH 21.5 (L) 11/16/2020   Lab Results  Component Value Date   WBC 5.3 05/29/2021   HGB 15.4 05/29/2021   HCT 46.4 05/29/2021   MCV 99 (H) 05/29/2021   PLT 251 05/29/2021   Lab Results  Component Value Date   IRON 233 (H) 11/16/2020   TIBC 344 11/16/2020   FERRITIN 449 (H) 11/16/2020   Attestation Statements:   Reviewed by clinician on day of visit: allergies, medications, problem list, medical history, surgical history, family history, social history, and  previous encounter notes.  I, Water quality scientist, CMA, am acting as Location manager for Mina Marble, NP.  I have reviewed the above documentation for accuracy and completeness, and I agree with the above. - Shay Jhaveri d. Laurier Jasperson, NP-C

## 2021-10-03 NOTE — Therapy (Signed)
[facility] [address] [place], [place], [address] Phone: [phone]   Fax:  [phone]  Occupational Therapy Evaluation  Patient Details  Name: Gina Washington MRN: [id number] Date of Birth: Jul 17, 1966 Referring Provider (OT): Dr. Krista Blue   Encounter Date: 10/03/2021   OT End of Session - 10/03/21 1029     Visit Number 1    Number of Visits 13    Date for OT Re-Evaluation 12/26/20    Authorization Type BCBS    OT Start Time [phone]    OT Stop Time 0800    OT Time Calculation (min) 34 min             Past Medical History:  Diagnosis Date   ADHD    Anxiety    B12 deficiency    Back pain    Basal cell carcinoma    leg   Basal cell carcinoma    Constipation    Functional movement disorder    GERD (gastroesophageal reflux disease)    Herpes zoster without complication    Hypertension    Kidney problem    Lower extremity edema    Migraine    Neurological movement disorder    Vaginal Pap smear, abnormal    age 27 yr- cryo done    Past Surgical History:  Procedure Laterality Date   ANKLE ARTHROSCOPY     CESAREAN SECTION     CRYOTHERAPY      There were no vitals filed for this visit.   Subjective Assessment - 10/03/21 0813     Subjective  Pt reports difficutly using her RUE    Pertinent History Pt is a 55 year old female  with Parkinsonian features, but no clear PD diagnosis, previous diagnosis of functional movement disorder referred with diagnosis of rigidity.  (Pt's MRI was normal she reports several year history of R foot drag with gait, decreased coordination/use of RUE with  ADLs.    Patient Stated Goals get use of RUE back    Currently in Pain? No/denies               [facility] OT Assessment - 10/03/21 0836       Assessment   Medical Diagnosis rigidity, hx of functional movement disorder, Parkinsonian features    Referring Provider (OT) Dr. Krista Blue    Onset Date/Surgical Date 09/17/21     Hand Dominance Right      Balance Screen   Has the patient fallen in the past 6 months Yes    How many times? 1    Has the patient had a decrease in activity level because of a fear of falling?  Yes    Is the patient reluctant to leave their home because of a fear of falling?  No      Home  Environment   Family/patient expects to be discharged to: Private residence    Lives With Spouse      Prior Function   Level of Independence Independent      ADL   Eating/Feeding Modified independent   using both hands   Grooming Modified independent   Difficulty drying hair   Upper Body Bathing Modified independent    Lower Body Bathing Modified independent    Upper Body Dressing Needs assist for fasteners    Lower Body Dressing Modified independent   difficulty with tying shoes   Toilet Transfer Modified independent    Toileting - Clothing Manipulation Modified independent    ADL comments  Pt reports she only uses RUE  grossly 50% as an assist not dominant hand. Pt reports difficulty with tying shoes, pulling up pants, difficulty using both hands together, difficulty donning earrings and using computer mouse.      IADL   Shopping Takes care of all shopping needs independently    Light Housekeeping Performs light daily tasks such as dishwashing, bed making   has assistance for heavier cleaning   Meal Prep Able to complete simple warm meal prep    Medication Management Is responsible for taking medication in correct dosages at correct time      Mobility   Mobility Status History of falls   modified independent     Observation/Other Assessments   Other Surveys  Select    Physical Performance Test   Yes    Simulated Eating Time (seconds) 23.75      Coordination   Gross Motor Movements are Fluid and Coordinated No    Fine Motor Movements are Fluid and Coordinated No    9 Hole Peg Test Right;Left    Right 9 Hole Peg Test 40.72    Left 9 Hole Peg Test 28.88    Box and Blocks R 29, L 48     Tremors Pt rpeorts tremors in afternoon    Coordination impaired for RUE      ROM / Strength   AROM / PROM / Strength AROM      AROM   Overall AROM  Deficits    Overall AROM Comments RUE shoulder flexion 120, elbow extension -25, decreased supination, maintains R hand in flexion at rest,  LUE shoulder flexion 130, elbow extension -5                                OT Short Term Goals - 10/03/21 0825       OT SHORT TERM GOAL #1   Title I with HEP    Time 6    Period Weeks    Status New    Target Date 11/14/21      OT SHORT TERM GOAL #2   Title Pt will demonstrate improved RUE functional use as evidenced by increasing  box/ blocks score by 4 blocks.    Baseline RUE 29, LUE 48    Time 6    Period Weeks    Status New      OT SHORT TERM GOAL #3   Title Pt will be I with adapted strategies/ AE for ADLS/ IADLS to maximize safety and independence( drying hair, donning earrings, tying shoes, pulling up pants)    Time 6    Period Weeks    Status New               OT Long Term Goals - 10/03/21 8588       OT LONG TERM GOAL #1   Title Pt will resume use of RUE as her dominant hand at least 75% of the time for ADLs/IADLs.-    Time 12    Period Weeks    Status New    Target Date 12/26/21      OT LONG TERM GOAL #2   Title Pt will demonstrate ability to retrieve a lightweight object from overhead shelf at 125 with -20 elbow extension.    Time 12    Period Weeks    Status New      OT LONG TERM GOAL #3   Title  Pt will demonstrate improved RUE fine motor coordination as evidenced by decreasing 9 hole peg test score by 3 secs    Baseline RUE 40.72, LUE 28.88    Time 12    Period Weeks    Status New      OT LONG TERM GOAL #4   Title Pt will demonstrate improved ease with self feeding as evidenced by decreasing PPT#2 to 20 secs or less    Baseline 23.75    Time 12    Period Weeks    Status New                   Plan - 10/03/21 0815      Clinical Impression Statement Pt is a 55 year old female  with Parkinsonian features, but no clear PD diagnosis, previous diagnosis of functional movement disorder. PMH; anxiety, HTN, basal cell carcinoma. (Pt's MRI was normal She reports several year history of R foot drag with gait, decreased coordination/use of RUE with  ADLs.  Pt presents with decreased coordination, RUE weakness, rigidity, decreased RUE functional use,decreased balance which impedes perfromacne of ADLS/ IADLS. Pt works as a Agricultural engineer.She would benefit from skilled OT to address the above stated deficits in order to maximize safety and I with ADLs/    OT Occupational Profile and History Detailed Assessment- Review of Records and additional review of physical, cognitive, psychosocial history related to current functional performance    Occupational performance deficits (Please refer to evaluation for details): IADL's;Work;Leisure;Social Participation;Play    Body Structure / Function / Physical Skills ADL;Balance;Mobility;Strength;Flexibility;FMC;UE functional use;Dexterity;IADL;Decreased knowledge of use of DME;Decreased knowledge of precautions;GMC;Coordination;ROM;Gait;Tone    Rehab Potential Good    Clinical Decision Making Limited treatment options, no task modification necessary    Comorbidities Affecting Occupational Performance: May have comorbidities impacting occupational performance    Modification or Assistance to Complete Evaluation  No modification of tasks or assist necessary to complete eval    OT Frequency 1x / week    OT Duration 12 weeks plus eval, anticipate d/c after 6-8 weeks   OT Treatment/Interventions Self-care/ADL training;Therapeutic exercise;Patient/family education;Passive range of motion;Moist Heat;Gait Training;Fluidtherapy;Cryotherapy;Paraffin;DME and/or AE instruction;Cognitive remediation/compensation;Therapeutic activities;Ultrasound;Neuromuscular education;Manual  Therapy;Splinting;Functional Mobility Training;Balance training    Plan coordination/ functional use HEP for RUE adapted strategeis for ADLS.             Patient will benefit from skilled therapeutic intervention in order to improve the following deficits and impairments:   Body Structure / Function / Physical Skills: ADL, Balance, Mobility, Strength, Flexibility, FMC, UE functional use, Dexterity, IADL, Decreased knowledge of use of DME, Decreased knowledge of precautions, GMC, Coordination, ROM, Gait, Tone       Visit Diagnosis: Stiffness of right hand, not elsewhere classified - Plan: Ot plan of care cert/re-cert  Stiffness of right elbow, not elsewhere classified - Plan: Ot plan of care cert/re-cert  Stiffness of right shoulder, not elsewhere classified - Plan: Ot plan of care cert/re-cert  Other lack of coordination - Plan: Ot plan of care cert/re-cert  Muscle weakness (generalized) - Plan: Ot plan of care cert/re-cert    Problem List Patient Active Problem List   Diagnosis Date Noted   Tremor of right hand 09/10/2021   Other hyperlipidemia 06/23/2021   HTN (hypertension), benign 03/25/2021   Polyphagia 01/15/2021   Class 1 obesity with serious comorbidity and body mass index (BMI) of 32.0 to 32.9 in adult 12/19/2020   Vitamin D deficiency 12/19/2020   Vitamin B12 deficiency 07/11/2020  Rigidity 07/11/2020   Attention deficit hyperactivity disorder, combined type 07/19/2018   Generalized anxiety disorder 07/19/2018    Sammantha Mehlhaff, OT 10/03/2021, 10:34 AM  [facility] [address] [place], [place], [address] Phone: [phone]   Fax:  [phone]  Name: Kingston Guiles MRN: [id number] Date of Birth: 1966/03/25

## 2021-10-04 ENCOUNTER — Encounter (INDEPENDENT_AMBULATORY_CARE_PROVIDER_SITE_OTHER): Payer: Self-pay

## 2021-10-11 ENCOUNTER — Ambulatory Visit: Payer: BC Managed Care – PPO | Admitting: Physical Therapy

## 2021-10-19 ENCOUNTER — Ambulatory Visit: Payer: BC Managed Care – PPO | Admitting: Occupational Therapy

## 2021-10-19 ENCOUNTER — Ambulatory Visit: Payer: BC Managed Care – PPO | Admitting: Physical Therapy

## 2021-10-21 ENCOUNTER — Encounter (INDEPENDENT_AMBULATORY_CARE_PROVIDER_SITE_OTHER): Payer: Self-pay | Admitting: Adult Health

## 2021-10-22 ENCOUNTER — Other Ambulatory Visit: Payer: Self-pay

## 2021-10-22 ENCOUNTER — Ambulatory Visit: Payer: BC Managed Care – PPO | Attending: Internal Medicine | Admitting: Occupational Therapy

## 2021-10-22 ENCOUNTER — Encounter: Payer: Self-pay | Admitting: Occupational Therapy

## 2021-10-22 DIAGNOSIS — R293 Abnormal posture: Secondary | ICD-10-CM | POA: Insufficient documentation

## 2021-10-22 DIAGNOSIS — M25611 Stiffness of right shoulder, not elsewhere classified: Secondary | ICD-10-CM | POA: Diagnosis present

## 2021-10-22 DIAGNOSIS — R2681 Unsteadiness on feet: Secondary | ICD-10-CM | POA: Insufficient documentation

## 2021-10-22 DIAGNOSIS — M25621 Stiffness of right elbow, not elsewhere classified: Secondary | ICD-10-CM

## 2021-10-22 DIAGNOSIS — M6281 Muscle weakness (generalized): Secondary | ICD-10-CM

## 2021-10-22 DIAGNOSIS — R278 Other lack of coordination: Secondary | ICD-10-CM

## 2021-10-22 DIAGNOSIS — R2689 Other abnormalities of gait and mobility: Secondary | ICD-10-CM | POA: Insufficient documentation

## 2021-10-22 DIAGNOSIS — M25641 Stiffness of right hand, not elsewhere classified: Secondary | ICD-10-CM | POA: Diagnosis not present

## 2021-10-22 NOTE — Telephone Encounter (Signed)
Pt last seen by Katy Danford, FNP.  

## 2021-10-22 NOTE — Telephone Encounter (Signed)
Pt was started on Mounjaro for polyphagia. She is unable to use Coupon due to no DM diagnosis.Please advise.

## 2021-10-22 NOTE — Patient Instructions (Addendum)
° °  Coordination Exercises  Perform the following exercises for 20 minutes 1 times per day. Perform with both right hand. Perform using big movements.  Flipping Cards: Place deck of cards on the table. Flip cards over by opening your hand big to grasp and then turn your palm up big, opening hand fully to release. Deal cards: Hold 1/2 or whole deck in your hand. Use thumb to push card off top of deck with one big push. Rotate ball with fingertips: Pick up with fingers/thumb and move as much as you can with each turn/movement (clockwise and counter-clockwise if possible). Toss ball in the air and catch with the same hand: Toss big/high.  Deliberately open with toss and deliberately close hand after catch. Pick up coins and stack one at a time: Open hand big and pick up with big, intentional movements. Do not drag coin to the edge. (5-10 in a stack) Pick up 5-10 coins one at a time and hold in palm. Then, move coins from palm to fingertips one at time and place in coin bank/container. Twist nuts and bolts/bottle caps on/off using big turns of the wrist, open fingers between turns Practice writing: write big, and focus on forming each letter.  Use foam grip.  Hand stretch: Push hands out BIG. Elbows straight, wrists up, fingers open and spread apart BIG.--Perform at least 10 repetitions 1x/day, but perform stretching throughout the day when you are having trouble using your hands (picking up/manipulating small objects, writing, eating, typing, sewing, buttoning, etc.).

## 2021-10-22 NOTE — Therapy (Signed)
[facility] [address] [place], [place], [address] Phone: [phone]   Fax:  [phone]  Occupational Therapy Treatment  Patient Details  Name: Gina Washington MRN: [id number] Date of Birth: 1966-04-07 Referring Provider (OT): Dr. Krista Blue   Encounter Date: 10/22/2021   OT End of Session - 10/22/21 0726     Visit Number 2    Number of Visits 13    Date for OT Re-Evaluation 12/26/20    Authorization Type BCBS--20% coinsurance, no auth required    OT Start Time 0724    OT Stop Time 0802    OT Time Calculation (min) 38 min    Activity Tolerance Patient tolerated treatment well    Behavior During Therapy WFL for tasks assessed/performed             Past Medical History:  Diagnosis Date   ADHD    Anxiety    B12 deficiency    Back pain    Basal cell carcinoma    leg   Basal cell carcinoma    Constipation    Functional movement disorder    GERD (gastroesophageal reflux disease)    Herpes zoster without complication    Hypertension    Kidney problem    Lower extremity edema    Migraine    Neurological movement disorder    Vaginal Pap smear, abnormal    age 56 yr- cryo done    Past Surgical History:  Procedure Laterality Date   ANKLE ARTHROSCOPY     CESAREAN SECTION     CRYOTHERAPY      There were no vitals filed for this visit.   Subjective Assessment - 10/22/21 0725     Subjective  Pt reports difficulty writing, putting her hair up    Pertinent History Pt is a 56 year old female  with Parkinsonian features, but no clear PD diagnosis, previous diagnosis of functional movement disorder referred with diagnosis of rigidity.  (Pt's MRI was normal she reports several year history of R foot drag with gait, decreased coordination/use of RUE with  ADLs.    Patient Stated Goals get use of RUE back    Currently in Pain? No/denies              Writing 2 sentences with built-up foam grip (pt reports that  she has at home from previous therapy).  Pt with significant incr time, but good legibility/size.  Pt reports that grip helps her to write bigger.          OT Education - 10/22/21 0730     Education Details Initial HEP for coorination--see pt instructions; Emphasized stretching hand frequently prior and during to fine motor tasks to help with rigidity and bradykinesia    Person(s) Educated Patient    Methods Explanation;Demonstration;Verbal cues;Handout    Comprehension Verbalized understanding;Returned demonstration;Verbal cues required              OT Short Term Goals - 10/03/21 0825       OT SHORT TERM GOAL #1   Title I with HEP    Time 6    Period Weeks    Status New    Target Date 11/14/21      OT SHORT TERM GOAL #2   Title Pt will demonstrate improved RUE functional use as evidenced by increasing  box/ blocks score by 4 blocks.    Baseline RUE 29, LUE 48    Time 6    Period Weeks  Status New      OT SHORT TERM GOAL #3   Title Pt will be I with adapted strategies/ AE for ADLS/ IADLS to maximize safety and independence( drying hair, donning earrings, tying shoes, pulling up pants)    Time 6    Period Weeks    Status New               OT Long Term Goals - 10/03/21 6237       OT LONG TERM GOAL #1   Title Pt will resume use of RUE as her dominant hand at least 75% of the time for ADLs/IADLs.-    Time 12    Period Weeks    Status New    Target Date 12/26/21      OT LONG TERM GOAL #2   Title Pt will demonstrate ability to retrieve a lightweight object from overhead shelf at 125 with -20 elbow extension.    Time 12    Period Weeks    Status New      OT LONG TERM GOAL #3   Title Pt will demonstrate improved RUE fine motor coordination as evidenced by decreasing 9 hole peg test score by 3 secs    Baseline RUE 40.72, LUE 28.88    Time 12    Period Weeks    Status New      OT LONG TERM GOAL #4   Title Pt will demonstrate improved ease with self  feeding as evidenced by decreasing PPT#2 to 20 secs or less    Baseline 23.75    Time 12    Period Weeks    Status New                   Plan - 10/22/21 0727     Clinical Impression Statement Pt verbalized understanding of initial HEP, but needs cueing for large amplitude.  Pt demo bradykinesia and reports rigidity and will need review and reinforcement for large amplitude.    OT Occupational Profile and History Detailed Assessment- Review of Records and additional review of physical, cognitive, psychosocial history related to current functional performance    Occupational performance deficits (Please refer to evaluation for details): IADL's;Work;Leisure;Social Participation;Play    Body Structure / Function / Physical Skills ADL;Balance;Mobility;Strength;Flexibility;FMC;UE functional use;Dexterity;IADL;Decreased knowledge of use of DME;Decreased knowledge of precautions;GMC;Coordination;ROM;Gait;Tone    Rehab Potential Good    Clinical Decision Making Limited treatment options, no task modification necessary    Comorbidities Affecting Occupational Performance: May have comorbidities impacting occupational performance    Modification or Assistance to Complete Evaluation  No modification of tasks or assist necessary to complete eval    OT Frequency 1x / week    OT Duration 12 weeks    OT Treatment/Interventions Self-care/ADL training;Therapeutic exercise;Patient/family education;Passive range of motion;Moist Heat;Gait Training;Fluidtherapy;Cryotherapy;Paraffin;DME and/or AE instruction;Cognitive remediation/compensation;Therapeutic activities;Ultrasound;Neuromuscular education;Manual Therapy;Splinting;Functional Mobility Training;Balance training    Plan stretching RUE (shoulder/elbow, trunk--?PWR!), adapted strategeis for ADLS.    Consulted and Agree with Plan of Care Patient             Patient will benefit from skilled therapeutic intervention in order to improve the  following deficits and impairments:   Body Structure / Function / Physical Skills: ADL, Balance, Mobility, Strength, Flexibility, FMC, UE functional use, Dexterity, IADL, Decreased knowledge of use of DME, Decreased knowledge of precautions, GMC, Coordination, ROM, Gait, Tone       Visit Diagnosis: Stiffness of right hand, not elsewhere classified  Stiffness of right elbow, not  elsewhere classified  Stiffness of right shoulder, not elsewhere classified  Other lack of coordination  Muscle weakness (generalized)    Problem List Patient Active Problem List   Diagnosis Date Noted   Tremor of right hand 09/10/2021   Other hyperlipidemia 06/23/2021   HTN (hypertension), benign 03/25/2021   Polyphagia 01/15/2021   Class 1 obesity with serious comorbidity and body mass index (BMI) of 32.0 to 32.9 in adult 12/19/2020   Vitamin D deficiency 12/19/2020   Vitamin B12 deficiency 07/11/2020   Rigidity 07/11/2020   Attention deficit hyperactivity disorder, combined type 07/19/2018   Generalized anxiety disorder 07/19/2018    [facility], OT 10/22/2021, 9:31 AM  [facility] [address] [place], [place], [address] Phone: [phone]   Fax:  [phone]  Name: Samyah Bilbo MRN: [id number] Date of Birth: January 31, 1966  Vianne Bulls, OTR/L [facility] [address] [place], [place]  [address] [phone] phone [phone] 10/22/21 9:31 AM

## 2021-10-29 ENCOUNTER — Other Ambulatory Visit: Payer: Self-pay

## 2021-10-29 ENCOUNTER — Telehealth (INDEPENDENT_AMBULATORY_CARE_PROVIDER_SITE_OTHER): Payer: Self-pay

## 2021-10-29 ENCOUNTER — Encounter (INDEPENDENT_AMBULATORY_CARE_PROVIDER_SITE_OTHER): Payer: Self-pay

## 2021-10-29 ENCOUNTER — Encounter (INDEPENDENT_AMBULATORY_CARE_PROVIDER_SITE_OTHER): Payer: Self-pay | Admitting: Family Medicine

## 2021-10-29 ENCOUNTER — Ambulatory Visit (INDEPENDENT_AMBULATORY_CARE_PROVIDER_SITE_OTHER): Payer: BC Managed Care – PPO | Admitting: Family Medicine

## 2021-10-29 VITALS — BP 114/77 | HR 67 | Temp 97.5°F | Ht 63.0 in | Wt 153.0 lb

## 2021-10-29 DIAGNOSIS — I10 Essential (primary) hypertension: Secondary | ICD-10-CM

## 2021-10-29 DIAGNOSIS — Z6827 Body mass index (BMI) 27.0-27.9, adult: Secondary | ICD-10-CM

## 2021-10-29 DIAGNOSIS — E669 Obesity, unspecified: Secondary | ICD-10-CM

## 2021-10-29 DIAGNOSIS — R632 Polyphagia: Secondary | ICD-10-CM

## 2021-10-29 MED ORDER — WEGOVY 1 MG/0.5ML ~~LOC~~ SOAJ
1.0000 mg | SUBCUTANEOUS | 0 refills | Status: DC
Start: 2021-10-29 — End: 2021-12-24
  Filled 2021-11-09: qty 2, 28d supply, fill #0

## 2021-10-29 NOTE — Progress Notes (Signed)
Chief Complaint:   OBESITY Gina Washington is here to discuss her progress with her obesity treatment plan along with follow-up of her obesity related diagnoses. Gina Washington is on the Category 1 Plan and states she is following her eating plan approximately 60% of the time. Gina Washington states she is doing 0 minutes 0 times per week.  Today's visit was #: 15 Starting weight: 182 lbs Starting date: 11/16/2020 Today's weight: 153 lbs Today's date: 10/29/2021 Total lbs lost to date: 29 lbs Total lbs lost since last in-office visit: +7  Interim History: Gina Washington celebrated a birthday and ate more carbohydrate than usual over Christmas. She is surprised with 7 lb weight gain. She did try to portion control. She has been off Mounjaro for 2 weeks due to availability. Her water intake is good.  Subjective:   1. Polyphagia Gina Washington notes increased appetite since being of Mounjaro.  2. Essential hypertension Gina Washington's blood pressure is well controlled. She is on amlodipine and Metoprolol currently.   Assessment/Plan:   1. Polyphagia We switch to Wegovy 1.0 mg weekly since this is more readily available. - Semaglutide-Weight Management (WEGOVY) 1 MG/0.5ML SOAJ; Inject 1 mg into the skin once a week.  Dispense: 2 mL; Refill: 0  2. Essential hypertension Gina Washington will continue antihypertensives.   3. Overweight: Current BMI 27.11 Gina Washington is currently in the action stage of change. As such, her goal is to continue with weight loss efforts. She has agreed to the Category 1 Plan.   Exercise goals:  As is.  Behavioral modification strategies: increasing lean protein intake and decreasing simple carbohydrates.  Gina Washington has agreed to follow-up with our clinic in 4 weeks.  Objective:   Blood pressure 114/77, pulse 67, temperature (!) 97.5 F (36.4 C), height 5\' 3"  (1.6 m), weight 153 lb (69.4 kg), SpO2 100 %. Body mass index is 27.1 kg/m.  General: Cooperative, alert, well developed, in no acute  distress. HEENT: Conjunctivae and lids unremarkable. Cardiovascular: Regular rhythm.  Lungs: Normal work of breathing. Neurologic: No focal deficits.   Lab Results  Component Value Date   CREATININE 0.89 05/29/2021   BUN 10 05/29/2021   NA 141 05/29/2021   K 4.8 05/29/2021   CL 101 05/29/2021   CO2 24 05/29/2021   Lab Results  Component Value Date   ALT 18 05/29/2021   AST 14 05/29/2021   ALKPHOS 88 05/29/2021   BILITOT 0.3 05/29/2021   Lab Results  Component Value Date   HGBA1C 5.5 11/16/2020   Lab Results  Component Value Date   INSULIN 4.2 11/16/2020   Lab Results  Component Value Date   TSH 2.500 11/16/2020   Lab Results  Component Value Date   CHOL 211 (H) 05/29/2021   HDL 84 05/29/2021   LDLCALC 115 (H) 05/29/2021   TRIG 67 05/29/2021   CHOLHDL 2.2 11/16/2020   Lab Results  Component Value Date   VD25OH 74.3 05/29/2021   VD25OH 21.5 (L) 11/16/2020   Lab Results  Component Value Date   WBC 5.3 05/29/2021   HGB 15.4 05/29/2021   HCT 46.4 05/29/2021   MCV 99 (H) 05/29/2021   PLT 251 05/29/2021   Lab Results  Component Value Date   IRON 233 (H) 11/16/2020   TIBC 344 11/16/2020   FERRITIN 449 (H) 11/16/2020   Attestation Statements:   Reviewed by clinician on day of visit: allergies, medications, problem list, medical history, surgical history, family history, social history, and previous encounter notes.  I, Lizbeth Bark,  RMA, am acting as Location manager for [employer], [place].  I have reviewed the above documentation for accuracy and completeness, and I agree with the above. -  Georgianne Fick, FNP

## 2021-10-29 NOTE — Telephone Encounter (Signed)
Your information has been submitted to [employer]. To check for an updated outcome later, reopen this PA request from your dashboard. If [employer] has not responded to your request within 24 hours, contact [employer] at [phone]. If you think there may be a problem with your PA request, use our live chat feature at the bottom right.    

## 2021-10-29 NOTE — Telephone Encounter (Signed)
[facility] [employer]  received a request from your provider for coverage of Wegovy (semaglutide injection). As long as you remain covered by the [facility] and there are no changes to your plan benefits, this request is approved for the following time period: 10/29/2021 - 10/29/2022

## 2021-10-29 NOTE — Telephone Encounter (Signed)
PA has been submitted for [facility] via cover my meds

## 2021-10-31 ENCOUNTER — Other Ambulatory Visit (HOSPITAL_COMMUNITY): Payer: Self-pay

## 2021-11-02 ENCOUNTER — Ambulatory Visit: Payer: BC Managed Care – PPO

## 2021-11-02 ENCOUNTER — Ambulatory Visit: Payer: BC Managed Care – PPO | Admitting: Occupational Therapy

## 2021-11-02 ENCOUNTER — Other Ambulatory Visit: Payer: Self-pay

## 2021-11-02 DIAGNOSIS — M25641 Stiffness of right hand, not elsewhere classified: Secondary | ICD-10-CM | POA: Diagnosis not present

## 2021-11-02 DIAGNOSIS — R278 Other lack of coordination: Secondary | ICD-10-CM

## 2021-11-02 DIAGNOSIS — R293 Abnormal posture: Secondary | ICD-10-CM

## 2021-11-02 DIAGNOSIS — R2681 Unsteadiness on feet: Secondary | ICD-10-CM

## 2021-11-02 DIAGNOSIS — M6281 Muscle weakness (generalized): Secondary | ICD-10-CM

## 2021-11-02 DIAGNOSIS — M25611 Stiffness of right shoulder, not elsewhere classified: Secondary | ICD-10-CM

## 2021-11-02 DIAGNOSIS — R2689 Other abnormalities of gait and mobility: Secondary | ICD-10-CM

## 2021-11-02 DIAGNOSIS — M25621 Stiffness of right elbow, not elsewhere classified: Secondary | ICD-10-CM

## 2021-11-02 NOTE — Therapy (Signed)
[facility] [address] [place], [place], [address] Phone: [phone]   Fax:  [phone]  Physical Therapy Evaluation  Patient Details  Name: Gina Washington MRN: [id number] Date of Birth: [birth year] Referring Provider (PT): Marcial Pacas, MD   Encounter Date: 11/02/2021   PT End of Session - 11/02/21 0756     Visit Number 1    Number of Visits 9    Date for PT Re-Evaluation 12/28/21    Authorization Type BCBS ([place]: [place])    PT Start Time 0800    PT Stop Time 0841    PT Time Calculation (min) 41 min    Activity Tolerance Patient tolerated treatment well    Behavior During Therapy WFL for tasks assessed/performed             Past Medical History:  Diagnosis Date   ADHD    Anxiety    B12 deficiency    Back pain    Basal cell carcinoma    leg   Basal cell carcinoma    Constipation    Functional movement disorder    GERD (gastroesophageal reflux disease)    Herpes zoster without complication    Hypertension    Kidney problem    Lower extremity edema    Migraine    Neurological movement disorder    Vaginal Pap smear, abnormal    age 58 yr- cryo done    Past Surgical History:  Procedure Laterality Date   ANKLE ARTHROSCOPY     CESAREAN SECTION     CRYOTHERAPY      There were no vitals filed for this visit.    Subjective Assessment - 11/02/21 0759     Subjective Patient reports that her R leg wants to drag on her, catching it multiple times a day. Worse when she is tired. Also has tremors in the R Hand. No issues on left side per patient reports. Reports she first started noticing the symptoms approx. 10 years ago. no clear PD diagnosis, previous diagnosis of functional movement disorder referred with diagnosis of rigidity. Has had two falls over the past year, both occured on the stairs. Reports she is catching the foot on the step and has caused her to falls. No using any kind of AD. Reports she does have  some intermittent soreness on the lateral aspect of the R foot.    Pertinent History ADHD, Anxiety, Basal Cell Carcinoma, Functional Movement Disorder, GERD, HTN, Migraine    Limitations Walking;House hold activities    Patient Stated Goals Have more control over the walking    Currently in Pain? No/denies                [facility] PT Assessment - 11/02/21 0001       Assessment   Medical Diagnosis Rigidity    Referring Provider (PT) Marcial Pacas, MD    Onset Date/Surgical Date 09/17/21   referral date   Hand Dominance Right    Prior Therapy [facility]      Precautions   Precautions Fall;Other (comment)    Precaution Comments ADHD, Anxiety, Basal Cell Carcinoma, Functional Movement Disorder, GERD, HTN, Migraine      Balance Screen   Has the patient fallen in the past 6 months Yes    How many times? 2    Has the patient had a decrease in activity level because of a fear of falling?  No    Is the patient reluctant to  leave their home because of a fear of falling?  Yes      [place] Private residence    Living Arrangements Spouse/significant other    Available Help at Discharge Family    Type of [place] to enter    Entrance Stairs-Number of Steps 8   on the back of the house   Entrance Stairs-Rails [place] Two level;Able to live on main level with bedroom/bathroom    Alternate Level Stairs-Number of Steps 12    Home Equipment None      Prior Function   Level of Independence Independent    Vocation Full time employment    Probation officer      Cognition   Overall Cognitive Status Within Functional Limits for tasks assessed      Sensation   Light Touch Appears Intact      Coordination   Gross Motor Movements are Fluid and Coordinated No    Heel Shin Test increased challenge with RLE > LLE      Posture/Postural Control   Posture/Postural Control Postural  limitations    Postural Limitations Flexed trunk;Rounded Shoulders;Forward head      Tone   Assessment Location Right Lower Extremity;Left Lower Extremity      ROM / Strength   AROM / PROM / Strength AROM;Strength      AROM   Overall AROM  Deficits      Strength   Overall Strength Deficits    Strength Assessment Site Hip;Ankle;Knee    Right/Left Hip Right;Left    Right Hip Flexion 4-/5    Left Hip Flexion 4+/5    Right/Left Knee Right;Left    Right Knee Flexion 3+/5    Right Knee Extension 4-/5    Left Knee Flexion 4+/5    Left Knee Extension 4+/5    Right/Left Ankle Right;Left    Right Ankle Dorsiflexion 3+/5    Left Ankle Dorsiflexion 4+/5      Bed Mobility   Bed Mobility Rolling Right;Rolling Left   reports independence with bed mobility.     Transfers   Transfers Sit to Stand;Stand to Sit    Sit to Stand 5: Supervision    Five time sit to stand comments  14.03 secs with one instance of using LUE to push from leg, from standard chair.    Stand to Sit 5: Supervision      Ambulation/Gait   Ambulation/Gait Yes    Ambulation/Gait Assistance 5: Supervision    Ambulation/Gait Assistance Details mild instances of catching R Toe, decreased arm swing    Ambulation Distance (Feet) 115 Feet    Assistive device None    Gait Pattern Decreased arm swing - right;Step-through pattern;Decreased step length - right;Decreased step length - left;Decreased dorsiflexion - right    Ambulation Surface Level;Indoor    Gait velocity 11.44 secs = 2.87 ft/sec    Stairs Yes    Stairs Assistance 5: Supervision;4: Min guard    Stairs Assistance Details (indicate cue type and reason) supervision with use of rails, unsteadiness noted without use of rails CGA required.    Stair Management Technique No rails;Two rails;Alternating pattern;Forwards    Number of Stairs 8    Height of Stairs 6      Standardized Balance Assessment   Standardized Balance Assessment Timed Up and Go Test;Mini-BESTest       Mini-BESTest   Sit To Stand Normal: Comes to  stand without use of hands and stabilizes independently.    Rise to Toes Normal: Stable for 3 s with maximum height.    Stand on one leg (left) Normal: 20 s.    Stand on one leg (right) Moderate: < 20 s    Stand on one leg - lowest score 1    Compensatory Stepping Correction - Forward Normal: Recovers independently with a single, large step (second realignement is allowed).    Compensatory Stepping Correction - Backward Moderate: More than one step is required to recover equilibrium    Compensatory Stepping Correction - Left Lateral Moderate: Several steps to recover equilibrium    Compensatory Stepping Correction - Right Lateral Moderate: Several steps to recover equilibrium    Stepping Corredtion Lateral - lowest score 1    Stance - Feet together, eyes open, firm surface  Normal: 30s    Stance - Feet together, eyes closed, foam surface  Moderate: < 30s    Incline - Eyes Closed Normal: Stands independently 30s and aligns with gravity    Change in Gait Speed Normal: Significantly changes walkling speed without imbalance    Walk with head turns - Horizontal Moderate: performs head turns with reduction in gait speed.    Walk with pivot turns Moderate:Turns with feet close SLOW (>4 steps) with good balance.    Step over obstacles Moderate: Steps over box but touches box OR displays cautious behavior by slowing gait.    Timed UP & GO with Dual Task Moderate: Dual Task affects either counting OR walking (>10%) when compared to the TUG without Dual Task.    Mini-BEST total score 20      Timed Up and Go Test   TUG Normal TUG;Manual TUG;Cognitive TUG    Normal TUG (seconds) 9.28    Manual TUG (seconds) 12.59    Cognitive TUG (seconds) 14.81      RLE Tone   RLE Tone Mild      LLE Tone   LLE Tone Within Functional Limits                        Objective measurements completed on examination: See above findings.                 PT Education - 11/02/21 0759     Education Details Educated on [employer]) Educated Patient    Methods Explanation    Comprehension Verbalized understanding              PT Short Term Goals - 11/02/21 0931       PT SHORT TERM GOAL #1   Title Patient will be independent with initial HEP (All STGs Due: 11/30/21)    Baseline no HEP established    Time 4    Period Weeks    Status New    Target Date 11/30/21      PT SHORT TERM GOAL #2   Title Patient will improve 5x sit <> stand to </= 12 seconds with UE support    Baseline 14.03    Time 4    Period Weeks    Status New               PT Long Term Goals - 11/02/21 1304       PT LONG TERM GOAL #1   Title Pt will be independent with HEP for improved balance and gait. (All LTGs Due: 12/28/21)    Baseline no HEp  established    Time 8    Period Weeks    Status New    Target Date 12/28/21      PT LONG TERM GOAL #2   Title Pt will improve TUG and TUG cog/man scores to <10% difference for improved dual tasking with gait.    Baseline TUG 9.28, manual 12.59, cognitive 14.81    Time 8    Period Weeks    Status New      PT LONG TERM GOAL #3   Title Pt will improve Mini Best score to >/= 23/28 to demonstrate improved balance    Baseline 20/30    Time 8    Period Weeks    Status New      PT LONG TERM GOAL #4   Title Patient will improve gait speed to >/= 3.0 ft/sec without AD to demo improved community mobiity    Baseline 2.87 ft/sec    Time 8    Period Weeks    Status New      PT LONG TERM GOAL #5   Title Pt will verbalize plans for continued community fitness upon d/c from PT.    Time 8    Period Weeks    Status New                    Plan - 11/02/21 0842     Clinical Impression Statement Patient is a 56 y.o. female referred to Neuro OPPT services for Rigidity. PMH includes the following: ADHD, Anxiety, Basal Cell Carcinoma, Functional Movement  Disorder, GERD, HTN, Migraine. Upon evaluation, patient presetns with the following impairments: abnormal posture, impaired tone, decreased strength, impaired balance, abnormal gait, decreased coordination due to rigidity/weakness, and increased fall risk. Patient scored 20/28 on Mini-Best, and increased challenge with dual tasking as noted with Cognitive/Manual TUG. Patient will benefit from skilled PT services to address impairments and reduce fall risk.    Personal Factors and Comorbidities Comorbidity 3+    Comorbidities ADHD, Anxiety, Basal Cell Carcinoma, Functional Movement Disorder, GERD, HTN, Migraine    Examination-Activity Limitations Bed Mobility;Transfers;Locomotion Level;Stairs;Stand    Examination-Participation Restrictions Community Activity;Occupation    Stability/Clinical Decision Making Stable/Uncomplicated    Clinical Decision Making Low    Rehab Potential Good    PT Frequency 1x / week    PT Duration 8 weeks    PT Treatment/Interventions ADLs/Self Care Home Management;Aquatic Therapy;Moist Heat;Cryotherapy;DME Instruction;Gait training;Stair training;Functional mobility training;Therapeutic activities;Neuromuscular re-education;Balance training;Therapeutic exercise;Patient/family education;Orthotic Fit/Training;Manual techniques;Passive range of motion;Vestibular;Dry needling    PT Next Visit Plan Initiate HEP focused on balance/stepping strategies/step ups. Potential to try foot up rbace. Gait working on improved arm swing    Consulted and Agree with Plan of Care Patient             Patient will benefit from skilled therapeutic intervention in order to improve the following deficits and impairments:  Abnormal gait, Decreased balance, Difficulty walking, Impaired tone, Postural dysfunction, Decreased strength, Decreased range of motion, Decreased coordination, Decreased activity tolerance, Decreased endurance  Visit Diagnosis: Abnormal posture  Unsteadiness on  feet  Muscle weakness (generalized)  Other abnormalities of gait and mobility     Problem List Patient Active Problem List   Diagnosis Date Noted   Tremor of right hand 09/10/2021   Other hyperlipidemia 06/23/2021   Essential hypertension 03/25/2021   Polyphagia 01/15/2021   Class 1 obesity with serious comorbidity and body mass index (BMI) of 32.0 to 32.9 in adult 12/19/2020   Vitamin D  deficiency 12/19/2020   Vitamin B12 deficiency 07/11/2020   Rigidity 07/11/2020   Neoplasm of uncertain behavior of skin 01/01/2020   Functional movement disorder 11/19/2018   Attention deficit hyperactivity disorder, combined type 07/19/2018   Generalized anxiety disorder 07/19/2018    Jones Bales, PT, DPT 11/02/2021, 1:08 PM  [facility] [address] [place], [place], [address] Phone: [phone]   Fax:  [phone]  Name: Gina Washington MRN: [id number] Date of Birth: [birth year]

## 2021-11-02 NOTE — Therapy (Signed)
[facility] [address] [place], [place], [address] Phone: [phone]   Fax:  [phone]  Occupational Therapy Treatment  Patient Details  Name: Gina Washington MRN: [id number] Date of Birth: Feb 27, 1966 Referring Provider (OT): Dr. Krista Blue   Encounter Date: 11/02/2021   OT End of Session - 11/02/21 0808     Visit Number 3    Number of Visits 13    Date for OT Re-Evaluation 12/26/20    Authorization Type BCBS--20% coinsurance, no auth required    OT Start Time 0721    OT Stop Time 0800    OT Time Calculation (min) 39 min    Activity Tolerance Patient tolerated treatment well    Behavior During Therapy WFL for tasks assessed/performed             Past Medical History:  Diagnosis Date   ADHD    Anxiety    B12 deficiency    Back pain    Basal cell carcinoma    leg   Basal cell carcinoma    Constipation    Functional movement disorder    GERD (gastroesophageal reflux disease)    Herpes zoster without complication    Hypertension    Kidney problem    Lower extremity edema    Migraine    Neurological movement disorder    Vaginal Pap smear, abnormal    age 25 yr- cryo done    Past Surgical History:  Procedure Laterality Date   ANKLE ARTHROSCOPY     CESAREAN SECTION     CRYOTHERAPY      There were no vitals filed for this visit.   Subjective Assessment - 11/02/21 0742     Subjective  Denies pain    Pertinent History Pt is a 56 year old female  with Parkinsonian features, but no clear PD diagnosis, previous diagnosis of functional movement disorder referred with diagnosis of rigidity.  (Pt's MRI was normal she reports several year history of R foot drag with gait, decreased coordination/use of RUE with  ADLs.    Patient Stated Goals get use of RUE back    Currently in Pain? No/denies                                  OT Treatment/Education - 11/02/21 0942     Education  Details Issued PWR! moves supine as pt presents with Parkinsonian features, PWR! up, rock and twist, reveiwed flipping and dealing playing cards, stacking coins and manipulating in hand, rotating and tossing ball , all with RUE, min v.c and min-mod difficulty, Writing with foam grip- good legibility, significantly increased time required.    Person(s) Educated Patient    Methods Explanation;Demonstration;Verbal cues;Handout    Comprehension Verbalized understanding;Returned demonstration;Verbal cues required              OT Short Term Goals - 10/03/21 0825       OT SHORT TERM GOAL #1   Title I with HEP    Time 6    Period Weeks    Status New    Target Date 11/14/21      OT SHORT TERM GOAL #2   Title Pt will demonstrate improved RUE functional use as evidenced by increasing  box/ blocks score by 4 blocks.    Baseline RUE 29, LUE 48    Time 6    Period Weeks    Status New  OT SHORT TERM GOAL #3   Title Pt will be I with adapted strategies/ AE for ADLS/ IADLS to maximize safety and independence( drying hair, donning earrings, tying shoes, pulling up pants)    Time 6    Period Weeks    Status New               OT Long Term Goals - 10/03/21 7867       OT LONG TERM GOAL #1   Title Pt will resume use of RUE as her dominant hand at least 75% of the time for ADLs/IADLs.-    Time 12    Period Weeks    Status New    Target Date 12/26/21      OT LONG TERM GOAL #2   Title Pt will demonstrate ability to retrieve a lightweight object from overhead shelf at 125 with -20 elbow extension.    Time 12    Period Weeks    Status New      OT LONG TERM GOAL #3   Title Pt will demonstrate improved RUE fine motor coordination as evidenced by decreasing 9 hole peg test score by 3 secs    Baseline RUE 40.72, LUE 28.88    Time 12    Period Weeks    Status New      OT LONG TERM GOAL #4   Title Pt will demonstrate improved ease with self feeding as evidenced by decreasing PPT#2  to 20 secs or less    Baseline 23.75    Time 12    Period Weeks    Status New                   Plan - 11/02/21 0747     Clinical Impression Statement Pt is progressing towards goals. She demosntrates understanding of HEP for coordiantion and supine stretches.    OT Occupational Profile and History Detailed Assessment- Review of Records and additional review of physical, cognitive, psychosocial history related to current functional performance    Occupational performance deficits (Please refer to evaluation for details): IADL's;Work;Leisure;Social Participation;Play    Body Structure / Function / Physical Skills ADL;Balance;Mobility;Strength;Flexibility;FMC;UE functional use;Dexterity;IADL;Decreased knowledge of use of DME;Decreased knowledge of precautions;GMC;Coordination;ROM;Gait;Tone    Rehab Potential Good    Clinical Decision Making Limited treatment options, no task modification necessary    Comorbidities Affecting Occupational Performance: May have comorbidities impacting occupational performance    Modification or Assistance to Complete Evaluation  No modification of tasks or assist necessary to complete eval    OT Frequency 1x / week    OT Duration 12 weeks    OT Treatment/Interventions Self-care/ADL training;Therapeutic exercise;Patient/family education;Passive range of motion;Moist Heat;Gait Training;Fluidtherapy;Cryotherapy;Paraffin;DME and/or AE instruction;Cognitive remediation/compensation;Therapeutic activities;Ultrasound;Neuromuscular education;Manual Therapy;Splinting;Functional Mobility Training;Balance training    Plan adapted strategies for ADLS.    Consulted and Agree with Plan of Care Patient             Patient will benefit from skilled therapeutic intervention in order to improve the following deficits and impairments:   Body Structure / Function / Physical Skills: ADL, Balance, Mobility, Strength, Flexibility, FMC, UE functional use, Dexterity, IADL,  Decreased knowledge of use of DME, Decreased knowledge of precautions, GMC, Coordination, ROM, Gait, Tone       Visit Diagnosis: Stiffness of right hand, not elsewhere classified  Stiffness of right elbow, not elsewhere classified  Stiffness of right shoulder, not elsewhere classified  Other lack of coordination  Muscle weakness (generalized)    Problem List  Patient Active Problem List   Diagnosis Date Noted   Tremor of right hand 09/10/2021   Other hyperlipidemia 06/23/2021   Essential hypertension 03/25/2021   Polyphagia 01/15/2021   Class 1 obesity with serious comorbidity and body mass index (BMI) of 32.0 to 32.9 in adult 12/19/2020   Vitamin D deficiency 12/19/2020   Vitamin B12 deficiency 07/11/2020   Rigidity 07/11/2020   Neoplasm of uncertain behavior of skin 01/01/2020   Functional movement disorder 11/19/2018   Attention deficit hyperactivity disorder, combined type 07/19/2018   Generalized anxiety disorder 07/19/2018    Lillybeth Tal, OT 11/02/2021, 9:47 AM  [facility] [address] [place], [place], [address] Phone: [phone]   Fax:  [phone]  Name: Gina Washington MRN: [id number] Date of Birth: 1966/05/20

## 2021-11-07 ENCOUNTER — Ambulatory Visit: Payer: BC Managed Care – PPO | Admitting: Occupational Therapy

## 2021-11-07 ENCOUNTER — Ambulatory Visit: Payer: BC Managed Care – PPO

## 2021-11-07 ENCOUNTER — Other Ambulatory Visit: Payer: Self-pay

## 2021-11-07 DIAGNOSIS — R2681 Unsteadiness on feet: Secondary | ICD-10-CM

## 2021-11-07 DIAGNOSIS — R278 Other lack of coordination: Secondary | ICD-10-CM

## 2021-11-07 DIAGNOSIS — R293 Abnormal posture: Secondary | ICD-10-CM

## 2021-11-07 DIAGNOSIS — R2689 Other abnormalities of gait and mobility: Secondary | ICD-10-CM

## 2021-11-07 DIAGNOSIS — M6281 Muscle weakness (generalized): Secondary | ICD-10-CM

## 2021-11-07 DIAGNOSIS — M25641 Stiffness of right hand, not elsewhere classified: Secondary | ICD-10-CM | POA: Diagnosis not present

## 2021-11-07 DIAGNOSIS — M25611 Stiffness of right shoulder, not elsewhere classified: Secondary | ICD-10-CM

## 2021-11-07 DIAGNOSIS — M25621 Stiffness of right elbow, not elsewhere classified: Secondary | ICD-10-CM

## 2021-11-07 NOTE — Patient Instructions (Signed)
Bag Exercises:  Small trash bag or produce bag works best.  For all exercises, sit with big posture (sit up tall with head up) and use big movements. Perform the following exercises 1 times per day.  Hold end of bag in one hand. Stretch fingers out big to draw the entire bag into your palm. Repeat 3 times with each hand. Hold bag in one hand. Stretch both arms/hands out to the side as big as you can. Then, pass bag from one hand to the other BEHIND you. Stretch arms back out big after each pass. Repeat 10 times. Hold bag in both hands in front of you with hands/arms shoulder length apart. Move bag behind your head. Repeat 10 times.

## 2021-11-07 NOTE — Patient Instructions (Signed)
Access Code: [email or web address] URL: [email or web address] Date: 11/07/2021 Prepared by: Baldomero Lamy  Exercises Sit to Stand with Arms Crossed - 1 x daily - 5 x weekly - 2 sets - 10 reps Seated Hamstring Stretch - 1 x daily - 5 x weekly - 1 sets - 3 reps - 30 seconds hold Standing Gastroc Stretch at Counter - 1 x daily - 5 x weekly - 1 sets - 3 reps - 30 seconds hold Romberg Stance Eyes Closed on Foam Pad - 1 x daily - 5 x weekly - 1 sets - 3 reps - 30 seconds hold

## 2021-11-07 NOTE — Therapy (Signed)
[facility] [address] [place], [place], [address] Phone: [phone]   Fax:  [phone]  Physical Therapy Treatment  Patient Details  Name: Gina Washington MRN: [id number] Date of Birth: [birth year] Referring Provider (PT): Marcial Pacas, MD   Encounter Date: 11/07/2021   PT End of Session - 11/07/21 0720     Visit Number 2    Number of Visits 9    Date for PT Re-Evaluation 12/28/21    Authorization Type BCBS ([place]: [place])    PT Start Time 0716    PT Stop Time 0759    PT Time Calculation (min) 43 min    Activity Tolerance Patient tolerated treatment well    Behavior During Therapy WFL for tasks assessed/performed             Past Medical History:  Diagnosis Date   ADHD    Anxiety    B12 deficiency    Back pain    Basal cell carcinoma    leg   Basal cell carcinoma    Constipation    Functional movement disorder    GERD (gastroesophageal reflux disease)    Herpes zoster without complication    Hypertension    Kidney problem    Lower extremity edema    Migraine    Neurological movement disorder    Vaginal Pap smear, abnormal    age 56 yr- cryo done    Past Surgical History:  Procedure Laterality Date   ANKLE ARTHROSCOPY     CESAREAN SECTION     CRYOTHERAPY      There were no vitals filed for this visit.   Subjective Assessment - 11/07/21 0720     Subjective No new changes/complaints. No falls. No pain this morning.    Pertinent History ADHD, Anxiety, Basal Cell Carcinoma, Functional Movement Disorder, GERD, HTN, Migraine    Limitations Walking;House hold activities    Patient Stated Goals Have more control over the walking    Currently in Pain? No/denies                 [facility] Adult PT Treatment/Exercise - 11/07/21 0001       Transfers   Transfers Sit to Stand;Stand to Sit    Sit to Stand 5: Supervision    Stand to Sit 5: Supervision    Number of Reps 10 reps;2 sets    Comments  completed sit <> stand training 2 x 10, first set from mat working on improved forward lean. 2nd set completed form lower height to simulate getting out of car      Ambulation/Gait   Ambulation/Gait Yes    Ambulation/Gait Assistance 5: Supervision    Assistive device None    Gait Pattern Decreased arm swing - right;Step-through pattern;Decreased step length - right;Decreased step length - left;Decreased dorsiflexion - right    Ambulation Surface Level;Indoor      Exercises   Exercises Knee/Hip;Other Exercises    Other Exercises  Completed seated hamstring stretch bilat 2 x 30 seconds, followed by standing gastroc stretch bilat 2 x 30 seconds. Cues/demo for proper technique.      Knee/Hip Exercises: Aerobic   Other Aerobic Completed SciFit with BLE/BUE for reciprocal movement/activity tolerance on level 2.5 x 6 minutes. Cues to keep pace greater than >/= 70 steps/min.             Completed all of the following exercises as established Initial HEP during today's session. Handout provided:   Access Code: [email or web address]  URL: [email or web address] Date: 11/07/2021 Prepared by: Baldomero Lamy  Exercises Sit to Stand with Arms Crossed - 1 x daily - 5 x weekly - 2 sets - 10 reps Seated Hamstring Stretch - 1 x daily - 5 x weekly - 1 sets - 3 reps - 30 seconds hold Standing Gastroc Stretch at Counter - 1 x daily - 5 x weekly - 1 sets - 3 reps - 30 seconds hold Romberg Stance Eyes Closed on Foam Pad - 1 x daily - 5 x weekly - 1 sets - 3 reps - 30 seconds hold       PT Education - 11/07/21 0725     Education Details Initial HEP    Person(s) Educated Patient    Methods Explanation;Demonstration;Handout    Comprehension Returned demonstration;Verbalized understanding              PT Short Term Goals - 11/02/21 0931       PT SHORT TERM GOAL #1   Title Patient will be independent with initial HEP (All STGs Due: 11/30/21)    Baseline no HEP established    Time 4     Period Weeks    Status New    Target Date 11/30/21      PT SHORT TERM GOAL #2   Title Patient will improve 5x sit <> stand to </= 12 seconds with UE support    Baseline 14.03    Time 4    Period Weeks    Status New               PT Long Term Goals - 11/02/21 1304       PT LONG TERM GOAL #1   Title Pt will be independent with HEP for improved balance and gait. (All LTGs Due: 12/28/21)    Baseline no HEp established    Time 8    Period Weeks    Status New    Target Date 12/28/21      PT LONG TERM GOAL #2   Title Pt will improve TUG and TUG cog/man scores to <10% difference for improved dual tasking with gait.    Baseline TUG 9.28, manual 12.59, cognitive 14.81    Time 8    Period Weeks    Status New      PT LONG TERM GOAL #3   Title Pt will improve Mini Best score to >/= 23/28 to demonstrate improved balance    Baseline 20/30    Time 8    Period Weeks    Status New      PT LONG TERM GOAL #4   Title Patient will improve gait speed to >/= 3.0 ft/sec without AD to demo improved community mobiity    Baseline 2.87 ft/sec    Time 8    Period Weeks    Status New      PT LONG TERM GOAL #5   Title Pt will verbalize plans for continued community fitness upon d/c from PT.    Time 8    Period Weeks    Status New                   Plan - 11/07/21 0724     Clinical Impression Statement Today's session consisted of establishing initial HEP, with focus on stretches and balance activities on complaint surfaces. Patient tolerating all activities/exercises well. Will continue per POC.    Personal Factors and Comorbidities Comorbidity 3+    Comorbidities ADHD, Anxiety, Basal Cell Carcinoma, Functional  Movement Disorder, GERD, HTN, Migraine    Examination-Activity Limitations Bed Mobility;Transfers;Locomotion Level;Stairs;Stand    Examination-Participation Restrictions Community Activity;Occupation    Stability/Clinical Decision Making Stable/Uncomplicated    Rehab  Potential Good    PT Frequency 1x / week    PT Duration 8 weeks    PT Treatment/Interventions ADLs/Self Care Home Management;Aquatic Therapy;Moist Heat;Cryotherapy;DME Instruction;Gait training;Stair training;Functional mobility training;Therapeutic activities;Neuromuscular re-education;Balance training;Therapeutic exercise;Patient/family education;Orthotic Fit/Training;Manual techniques;Passive range of motion;Vestibular;Dry needling    PT Next Visit Plan Add in Banning moves (seated/standing?) Gait working on improved arm swing    Consulted and Agree with Plan of Care Patient             Patient will benefit from skilled therapeutic intervention in order to improve the following deficits and impairments:  Abnormal gait, Decreased balance, Difficulty walking, Impaired tone, Postural dysfunction, Decreased strength, Decreased range of motion, Decreased coordination, Decreased activity tolerance, Decreased endurance  Visit Diagnosis: Abnormal posture  Unsteadiness on feet  Muscle weakness (generalized)  Other abnormalities of gait and mobility     Problem List Patient Active Problem List   Diagnosis Date Noted   Tremor of right hand 09/10/2021   Other hyperlipidemia 06/23/2021   Essential hypertension 03/25/2021   Polyphagia 01/15/2021   Class 1 obesity with serious comorbidity and body mass index (BMI) of 32.0 to 32.9 in adult 12/19/2020   Vitamin D deficiency 12/19/2020   Vitamin B12 deficiency 07/11/2020   Rigidity 07/11/2020   Neoplasm of uncertain behavior of skin 01/01/2020   Functional movement disorder 11/19/2018   Attention deficit hyperactivity disorder, combined type 07/19/2018   Generalized anxiety disorder 07/19/2018    Jones Bales, PT, DPT 11/07/2021, 8:01 AM  [facility] [address] [place], [place], [address] Phone: [phone]   Fax:  [phone]  Name: Matalie Romberger MRN: [id number] Date  of Birth: 04-Mar-1966

## 2021-11-07 NOTE — Therapy (Signed)
[facility] [address] [place], [place], [address] Phone: [phone]   Fax:  [phone]  Occupational Therapy Treatment  Patient Details  Name: Gina Washington MRN: [id number] Date of Birth: May 28, 1966 Referring Provider (OT): Dr. Krista Blue   Encounter Date: 11/07/2021   OT End of Session - 11/07/21 1219     Visit Number 4    Number of Visits 13    Authorization Type BCBS--20% coinsurance, no auth required    OT Start Time 0803    OT Stop Time 0843    OT Time Calculation (min) 40 min    Activity Tolerance Patient tolerated treatment well             Past Medical History:  Diagnosis Date   ADHD    Anxiety    B12 deficiency    Back pain    Basal cell carcinoma    leg   Basal cell carcinoma    Constipation    Functional movement disorder    GERD (gastroesophageal reflux disease)    Herpes zoster without complication    Hypertension    Kidney problem    Lower extremity edema    Migraine    Neurological movement disorder    Vaginal Pap smear, abnormal    age 56 yr- cryo done    Past Surgical History:  Procedure Laterality Date   ANKLE ARTHROSCOPY     CESAREAN SECTION     CRYOTHERAPY      There were no vitals filed for this visit.    Pain: Pt denies pain, reports nothing new  Treatment: Standing at corner, closed chain diagonals with ball, min v.c  for RUE elbow extension Rolling ball up vertical surface with bilateral UE's min v.c  Simulated donning pants in seated then standing to pull up using bilateral UE's min v.c, therapist recommends pt donns in seated.                      OT Education - 11/07/21 0841     Education Details Bag exercises for simulated ADLs: simulated pulling up socks, tucking in shirt and donning shirt, issued as HEP, Reveiwed PWR! moves in supine for PWR! up, rock and twist, min v.c for amplitude    Person(s) Educated Patient    Methods  Explanation;Demonstration;Verbal cues;Handout    Comprehension Verbalized understanding;Returned demonstration;Verbal cues required              OT Short Term Goals - 10/03/21 0825       OT SHORT TERM GOAL #1   Title I with HEP    Time 6    Period Weeks    Status New    Target Date 11/14/21      OT SHORT TERM GOAL #2   Title Pt will demonstrate improved RUE functional use as evidenced by increasing  box/ blocks score by 4 blocks.    Baseline RUE 29, LUE 48    Time 6    Period Weeks    Status New      OT SHORT TERM GOAL #3   Title Pt will be I with adapted strategies/ AE for ADLS/ IADLS to maximize safety and independence( drying hair, donning earrings, tying shoes, pulling up pants)    Time 6    Period Weeks    Status New               OT Long Term Goals - 10/03/21 0539  OT LONG TERM GOAL #1   Title Pt will resume use of RUE as her dominant hand at least 75% of the time for ADLs/IADLs.-    Time 12    Period Weeks    Status New    Target Date 12/26/21      OT LONG TERM GOAL #2   Title Pt will demonstrate ability to retrieve a lightweight object from overhead shelf at 125 with -20 elbow extension.    Time 12    Period Weeks    Status New      OT LONG TERM GOAL #3   Title Pt will demonstrate improved RUE fine motor coordination as evidenced by decreasing 9 hole peg test score by 3 secs    Baseline RUE 40.72, LUE 28.88    Time 12    Period Weeks    Status New      OT LONG TERM GOAL #4   Title Pt will demonstrate improved ease with self feeding as evidenced by decreasing PPT#2 to 20 secs or less    Baseline 23.75    Time 12    Period Weeks    Status New                   Plan - 11/07/21 1221     Clinical Impression Statement Pt is progressing towards goals. She demonstrates improving RUE functional use following stretching.    OT Occupational Profile and History Detailed Assessment- Review of Records and additional review of physical,  cognitive, psychosocial history related to current functional performance    Occupational performance deficits (Please refer to evaluation for details): IADL's;Work;Leisure;Social Participation;Play    Body Structure / Function / Physical Skills ADL;Balance;Mobility;Strength;Flexibility;FMC;UE functional use;Dexterity;IADL;Decreased knowledge of use of DME;Decreased knowledge of precautions;GMC;Coordination;ROM;Gait;Tone    Rehab Potential Good    Clinical Decision Making Limited treatment options, no task modification necessary    Comorbidities Affecting Occupational Performance: May have comorbidities impacting occupational performance    Modification or Assistance to Complete Evaluation  No modification of tasks or assist necessary to complete eval    OT Frequency 1x / week    OT Duration 12 weeks    OT Treatment/Interventions Self-care/ADL training;Therapeutic exercise;Patient/family education;Passive range of motion;Moist Heat;Gait Training;Fluidtherapy;Cryotherapy;Paraffin;DME and/or AE instruction;Cognitive remediation/compensation;Therapeutic activities;Ultrasound;Neuromuscular education;Manual Therapy;Splinting;Functional Mobility Training;Balance training    Plan adapted strategies for ADLS, RUE functional use    Consulted and Agree with Plan of Care Patient             Patient will benefit from skilled therapeutic intervention in order to improve the following deficits and impairments:   Body Structure / Function / Physical Skills: ADL, Balance, Mobility, Strength, Flexibility, FMC, UE functional use, Dexterity, IADL, Decreased knowledge of use of DME, Decreased knowledge of precautions, GMC, Coordination, ROM, Gait, Tone       Visit Diagnosis: Abnormal posture  Unsteadiness on feet  Muscle weakness (generalized)  Other abnormalities of gait and mobility  Stiffness of right hand, not elsewhere classified  Stiffness of right elbow, not elsewhere classified  Stiffness  of right shoulder, not elsewhere classified  Other lack of coordination    Problem List Patient Active Problem List   Diagnosis Date Noted   Tremor of right hand 09/10/2021   Other hyperlipidemia 06/23/2021   Essential hypertension 03/25/2021   Polyphagia 01/15/2021   Class 1 obesity with serious comorbidity and body mass index (BMI) of 32.0 to 32.9 in adult 12/19/2020   Vitamin D deficiency 12/19/2020   Vitamin B12  deficiency 07/11/2020   Rigidity 07/11/2020   Neoplasm of uncertain behavior of skin 01/01/2020   Functional movement disorder 11/19/2018   Attention deficit hyperactivity disorder, combined type 07/19/2018   Generalized anxiety disorder 07/19/2018    Dason Mosley, OT 11/07/2021, 12:22 PM  [facility] [address] [place], [place], [address] Phone: [phone]   Fax:  [phone]  Name: Gina Washington MRN: [id number] Date of Birth: 1965-10-16

## 2021-11-09 ENCOUNTER — Other Ambulatory Visit (HOSPITAL_BASED_OUTPATIENT_CLINIC_OR_DEPARTMENT_OTHER): Payer: Self-pay

## 2021-11-14 ENCOUNTER — Ambulatory Visit: Payer: BC Managed Care – PPO | Admitting: Physical Therapy

## 2021-11-14 ENCOUNTER — Encounter: Payer: BC Managed Care – PPO | Admitting: Occupational Therapy

## 2021-11-19 ENCOUNTER — Ambulatory Visit: Payer: BC Managed Care – PPO | Admitting: Neurology

## 2021-11-19 ENCOUNTER — Ambulatory Visit: Payer: BC Managed Care – PPO | Admitting: Physical Therapy

## 2021-11-19 ENCOUNTER — Encounter: Payer: Self-pay | Admitting: Neurology

## 2021-11-19 ENCOUNTER — Ambulatory Visit: Payer: BC Managed Care – PPO | Attending: Internal Medicine | Admitting: Occupational Therapy

## 2021-11-19 ENCOUNTER — Other Ambulatory Visit: Payer: Self-pay

## 2021-11-19 ENCOUNTER — Encounter: Payer: Self-pay | Admitting: Occupational Therapy

## 2021-11-19 ENCOUNTER — Encounter: Payer: Self-pay | Admitting: Physical Therapy

## 2021-11-19 VITALS — BP 108/62 | HR 76 | Ht 63.0 in | Wt 153.0 lb

## 2021-11-19 DIAGNOSIS — R269 Unspecified abnormalities of gait and mobility: Secondary | ICD-10-CM | POA: Diagnosis not present

## 2021-11-19 DIAGNOSIS — M25641 Stiffness of right hand, not elsewhere classified: Secondary | ICD-10-CM | POA: Diagnosis present

## 2021-11-19 DIAGNOSIS — R278 Other lack of coordination: Secondary | ICD-10-CM | POA: Diagnosis present

## 2021-11-19 DIAGNOSIS — M25621 Stiffness of right elbow, not elsewhere classified: Secondary | ICD-10-CM | POA: Insufficient documentation

## 2021-11-19 DIAGNOSIS — R293 Abnormal posture: Secondary | ICD-10-CM

## 2021-11-19 DIAGNOSIS — M25611 Stiffness of right shoulder, not elsewhere classified: Secondary | ICD-10-CM | POA: Diagnosis present

## 2021-11-19 DIAGNOSIS — R2681 Unsteadiness on feet: Secondary | ICD-10-CM | POA: Insufficient documentation

## 2021-11-19 DIAGNOSIS — R2689 Other abnormalities of gait and mobility: Secondary | ICD-10-CM | POA: Insufficient documentation

## 2021-11-19 DIAGNOSIS — M6281 Muscle weakness (generalized): Secondary | ICD-10-CM | POA: Insufficient documentation

## 2021-11-19 DIAGNOSIS — R29898 Other symptoms and signs involving the musculoskeletal system: Secondary | ICD-10-CM | POA: Diagnosis not present

## 2021-11-19 MED ORDER — RASAGILINE MESYLATE 1 MG PO TABS: 1.0000 mg | ORAL_TABLET | Freq: Every day | ORAL | 11 refills | Status: DC

## 2021-11-19 NOTE — Therapy (Signed)
[facility] [address] [place], [place], [address] Phone: [phone]   Fax:  [phone]  Occupational Therapy Treatment  Patient Details  Name: Gina Washington MRN: [id number] Date of Birth: 05-11-1966 Referring Provider (OT): Dr. Krista Blue   Encounter Date: 11/19/2021   OT End of Session - 11/19/21 1236     Visit Number 5    Number of Visits 13    Authorization Type BCBS--20% coinsurance, no auth required    OT Start Time 8527    OT Stop Time 1315    OT Time Calculation (min) 41 min    Activity Tolerance Patient tolerated treatment well             Past Medical History:  Diagnosis Date   ADHD    Anxiety    B12 deficiency    Back pain    Basal cell carcinoma    leg   Basal cell carcinoma    Constipation    Functional movement disorder    GERD (gastroesophageal reflux disease)    Herpes zoster without complication    Hypertension    Kidney problem    Lower extremity edema    Migraine    Neurological movement disorder    Vaginal Pap smear, abnormal    age 56 yr- cryo done    Past Surgical History:  Procedure Laterality Date   ANKLE ARTHROSCOPY     CESAREAN SECTION     CRYOTHERAPY      There were no vitals filed for this visit.   Subjective Assessment - 11/19/21 1235     Subjective  Pt reports that Dr. Krista Blue wants her to try a PD medication and have a DAT scan.    Pertinent History Pt is a 56 year old female  with Parkinsonian features, but no clear PD diagnosis, previous diagnosis of functional movement disorder referred with diagnosis of rigidity.  (Pt's MRI was normal she reports several year history of R foot drag with gait, decreased coordination/use of RUE with  ADLs.    Patient Stated Goals get use of RUE back    Currently in Pain? No/denies                Began education regarding strategies for ADLs and use of large amplitude movements including:       - Typing with use of PWR!  Hands and removing hands from keyboard if not actively typing (no resting with fingers on keyboard).  PWR! Hands before mouse use.  Adjusting setting of mouse/keyboard to decr response speed.  - Practiced writing with use of foam grip and use of PWR! Hands (pt reports this really helps).  Recommended use of lined paper when possible and discussed printing vs. Cursive.  Pt with good legibility and size with both print and cursive.   Slight decr alignment (vertically with print) Recommended pt use the one that feels the easiest--pt reports that she finds it easier to use a combination.    - Pulling up pants:  Pt instructed in large amplitude movement strategies for pulling up/adjusting pants and practiced simulated with theraband loop.   Pt able to return demo.    - Emphasized the stop-stretch-reset big strategy with any functional difficulty using R hand/UE.    - Practiced typing shoe:  Pt able to tie 2x, min v.c. for PWR! Hand prior to grasping shoestrings.  - Washing hair:  recommended pt to initiate with RUE with large amplitude (open big prior) then add  in LUE.  With both hands:  with palm on table, individual finger lifts with abduction/adduction, then isolated/individual finger lifts for incr MP ext. With min v.c. for incr movement amplitude.  Isolated IP flexion/ext with PWR! Hands for decr rigidity of hands with min v.c.  Sliding cards across table with use of PWR! Hands with RUE with min cueing  for incr movement amplitude (also performed x4 with LUE).             OT Short Term Goals - 10/03/21 0825       OT SHORT TERM GOAL #1   Title I with HEP    Time 6    Period Weeks    Status New    Target Date 11/14/21      OT SHORT TERM GOAL #2   Title Pt will demonstrate improved RUE functional use as evidenced by increasing  box/ blocks score by 4 blocks.    Baseline RUE 29, LUE 48    Time 6    Period Weeks    Status New      OT SHORT TERM GOAL #3   Title Pt will be I with  adapted strategies/ AE for ADLS/ IADLS to maximize safety and independence( drying hair, donning earrings, tying shoes, pulling up pants)    Time 6    Period Weeks    Status New               OT Long Term Goals - 10/03/21 2229       OT LONG TERM GOAL #1   Title Pt will resume use of RUE as her dominant hand at least 75% of the time for ADLs/IADLs.-    Time 12    Period Weeks    Status New    Target Date 12/26/21      OT LONG TERM GOAL #2   Title Pt will demonstrate ability to retrieve a lightweight object from overhead shelf at 125 with -20 elbow extension.    Time 12    Period Weeks    Status New      OT LONG TERM GOAL #3   Title Pt will demonstrate improved RUE fine motor coordination as evidenced by decreasing 9 hole peg test score by 3 secs    Baseline RUE 40.72, LUE 28.88    Time 12    Period Weeks    Status New      OT LONG TERM GOAL #4   Title Pt will demonstrate improved ease with self feeding as evidenced by decreasing PPT#2 to 20 secs or less    Baseline 23.75    Time 12    Period Weeks    Status New                   Plan - 11/19/21 1236     Clinical Impression Statement Pt is progressing towards goals with improving RUE functional use and ADL performance with use of strategies.    OT Occupational Profile and History Detailed Assessment- Review of Records and additional review of physical, cognitive, psychosocial history related to current functional performance    Occupational performance deficits (Please refer to evaluation for details): IADL's;Work;Leisure;Social Participation;Play    Body Structure / Function / Physical Skills ADL;Balance;Mobility;Strength;Flexibility;FMC;UE functional use;Dexterity;IADL;Decreased knowledge of use of DME;Decreased knowledge of precautions;GMC;Coordination;ROM;Gait;Tone    Rehab Potential Good    Clinical Decision Making Limited treatment options, no task modification necessary    Comorbidities Affecting  Occupational Performance: May have comorbidities  impacting occupational performance    Modification or Assistance to Complete Evaluation  No modification of tasks or assist necessary to complete eval    OT Frequency 1x / week    OT Duration 12 weeks    OT Treatment/Interventions Self-care/ADL training;Therapeutic exercise;Patient/family education;Passive range of motion;Moist Heat;Gait Training;Fluidtherapy;Cryotherapy;Paraffin;DME and/or AE instruction;Cognitive remediation/compensation;Therapeutic activities;Ultrasound;Neuromuscular education;Manual Therapy;Splinting;Functional Mobility Training;Balance training    Plan adapted strategies for ADLS, RUE functional use    Consulted and Agree with Plan of Care Patient             Patient will benefit from skilled therapeutic intervention in order to improve the following deficits and impairments:   Body Structure / Function / Physical Skills: ADL, Balance, Mobility, Strength, Flexibility, FMC, UE functional use, Dexterity, IADL, Decreased knowledge of use of DME, Decreased knowledge of precautions, GMC, Coordination, ROM, Gait, Tone       Visit Diagnosis: Stiffness of right hand, not elsewhere classified  Stiffness of right elbow, not elsewhere classified  Stiffness of right shoulder, not elsewhere classified  Other lack of coordination  Unsteadiness on feet  Abnormal posture    Problem List Patient Active Problem List   Diagnosis Date Noted   Gait abnormality 11/19/2021   Tremor of right hand 09/10/2021   Other hyperlipidemia 06/23/2021   Essential hypertension 03/25/2021   Polyphagia 01/15/2021   Class 1 obesity with serious comorbidity and body mass index (BMI) of 32.0 to 32.9 in adult 12/19/2020   Vitamin D deficiency 12/19/2020   Vitamin B12 deficiency 07/11/2020   Rigidity 07/11/2020   Neoplasm of uncertain behavior of skin 01/01/2020   Functional movement disorder 11/19/2018   Attention deficit hyperactivity  disorder, combined type 07/19/2018   Generalized anxiety disorder 07/19/2018    Vianne Bulls, OT 11/19/2021, 3:14 PM  [facility] [address] [place], [place], [address] Phone: [phone]   Fax:  [phone]  Name: Gina Washington MRN: [id number] Date of Birth: 02-Aug-1966  Vianne Bulls, OTR/L [facility] [address] [place], [place]  [phone] phone [phone] 11/19/21 3:14 PM

## 2021-11-19 NOTE — Therapy (Signed)
[facility] [address] [place], [place], [address] Phone: [phone]   Fax:  [phone]  Physical Therapy Treatment  Patient Details  Name: Gina Washington MRN: [id number] Date of Birth: 05-19-1966 Referring Provider (PT): Marcial Pacas, MD   Encounter Date: 11/19/2021   PT End of Session - 11/19/21 1319     Visit Number 3    Number of Visits 9    Date for PT Re-Evaluation 12/28/21    Authorization Type BCBS ([place]: [place])    PT Start Time 7510    PT Stop Time 1359    PT Time Calculation (min) 42 min    Activity Tolerance Patient tolerated treatment well    Behavior During Therapy WFL for tasks assessed/performed             Past Medical History:  Diagnosis Date   ADHD    Anxiety    B12 deficiency    Back pain    Basal cell carcinoma    leg   Basal cell carcinoma    Constipation    Functional movement disorder    GERD (gastroesophageal reflux disease)    Herpes zoster without complication    Hypertension    Kidney problem    Lower extremity edema    Migraine    Neurological movement disorder    Vaginal Pap smear, abnormal    age 56 yr- cryo done    Past Surgical History:  Procedure Laterality Date   ANKLE ARTHROSCOPY     CESAREAN SECTION     CRYOTHERAPY      There were no vitals filed for this visit.   Subjective Assessment - 11/19/21 1319     Subjective No changes. Forgets to do her exercises sometimes at home. Tries to do them when she is working. Notes that she does not lift her right leg up high enough.    Pertinent History ADHD, Anxiety, Basal Cell Carcinoma, Functional Movement Disorder, GERD, HTN, Migraine    Limitations Walking;House hold activities    Patient Stated Goals Have more control over the walking    Currently in Pain? No/denies                               [facility] Adult PT Treatment/Exercise - 11/19/21 1326       Ambulation/Gait   Ambulation/Gait Yes     Ambulation/Gait Assistance 5: Supervision    Ambulation/Gait Assistance Details Cues for deliberate foot clearance with heel strike/incr step length during gait esp with RLE. Cued for more relaxed arm swing and opening up R hand during gait.    Ambulation Distance (Feet) 345 Feet   x2, 230 x 1   Assistive device None    Gait Pattern Decreased arm swing - right;Step-through pattern;Decreased step length - right;Decreased step length - left;Decreased dorsiflexion - right    Ambulation Surface Level;Indoor    Stairs Yes    Stairs Assistance 5: Supervision    Stairs Assistance Details (indicate cue type and reason) Cues for deliberate foot clearance when ascending and then "kicking" feet out when descending.    Stair Management Technique No rails    Number of Stairs 12   6                Balance Exercises - 11/19/21 1358       Balance Exercises: Standing   SLS with Vectors Solid surface;Limitations    SLS with Vectors  Limitations Alternating SLS taps to 1st then 2nd step for improved foot clearance x10 reps each side, esp with RLE    Stepping Strategy Posterior;10 reps;Limitations    Stepping Strategy Limitations Alternating legs x10 reps each side, adding in extending her arms and opening hands.    Other Standing Exercises Staggered stance weight shifting - x10 reps each side with adding in bilat arm swing and opening up hands, elbow extension.           Pt performs PWR! Moves in standing position:   PWR! Up for improved posture x10 reps -cues for elbow extension/opening up R hand  PWR! Rock for improved weighshifting -x10 reps each side and then looking up at hands   PWR! Twist for improved trunk rotation -x10 reps each side with cues to reset with tall posture in the middle.   PWR! Step for improved step initiation x10 reps each side, cued for incr step length and reaching out hands.   Cues provided for technique, relation to function, and bigger movement patterns.  Provided as HEP.         PT Short Term Goals - 11/02/21 0931       PT SHORT TERM GOAL #1   Title Patient will be independent with initial HEP (All STGs Due: 11/30/21)    Baseline no HEP established    Time 4    Period Weeks    Status New    Target Date 11/30/21      PT SHORT TERM GOAL #2   Title Patient will improve 5x sit <> stand to </= 12 seconds with UE support    Baseline 14.03    Time 4    Period Weeks    Status New               PT Long Term Goals - 11/02/21 1304       PT LONG TERM GOAL #1   Title Pt will be independent with HEP for improved balance and gait. (All LTGs Due: 12/28/21)    Baseline no HEp established    Time 8    Period Weeks    Status New    Target Date 12/28/21      PT LONG TERM GOAL #2   Title Pt will improve TUG and TUG cog/man scores to <10% difference for improved dual tasking with gait.    Baseline TUG 9.28, manual 12.59, cognitive 14.81    Time 8    Period Weeks    Status New      PT LONG TERM GOAL #3   Title Pt will improve Mini Best score to >/= 23/28 to demonstrate improved balance    Baseline 20/30    Time 8    Period Weeks    Status New      PT LONG TERM GOAL #4   Title Patient will improve gait speed to >/= 3.0 ft/sec without AD to demo improved community mobiity    Baseline 2.87 ft/sec    Time 8    Period Weeks    Status New      PT LONG TERM GOAL #5   Title Pt will verbalize plans for continued community fitness upon d/c from PT.    Time 8    Period Weeks    Status New                   Plan - 11/19/21 1433     Clinical Impression Statement Peformed standing PWR moves  with pt tolerating well. Needing intermittent cues throughout for opening up hands, elbow extension (esp with RUE). Worked on gait training with cues for deliberate heel strike/step length. Pt responded well to this cue demonstrating improved foot clearance with RLE throughout. Still with difficulty with RUE arm swing.    Personal  Factors and Comorbidities Comorbidity 3+    Comorbidities ADHD, Anxiety, Basal Cell Carcinoma, Functional Movement Disorder, GERD, HTN, Migraine    Examination-Activity Limitations Bed Mobility;Transfers;Locomotion Level;Stairs;Stand    Examination-Participation Restrictions Community Activity;Occupation    Stability/Clinical Decision Making Stable/Uncomplicated    Rehab Potential Good    PT Frequency 1x / week    PT Duration 8 weeks    PT Treatment/Interventions ADLs/Self Care Home Management;Aquatic Therapy;Moist Heat;Cryotherapy;DME Instruction;Gait training;Stair training;Functional mobility training;Therapeutic activities;Neuromuscular re-education;Balance training;Therapeutic exercise;Patient/family education;Orthotic Fit/Training;Manual techniques;Passive range of motion;Vestibular;Dry needling    PT Next Visit Plan review standing PWR moves. Worked on quadruped [employer] moves. Gait working on improved arm swing. Stepping strategies. High level balance.    Consulted and Agree with Plan of Care Patient             Patient will benefit from skilled therapeutic intervention in order to improve the following deficits and impairments:  Abnormal gait, Decreased balance, Difficulty walking, Impaired tone, Postural dysfunction, Decreased strength, Decreased range of motion, Decreased coordination, Decreased activity tolerance, Decreased endurance  Visit Diagnosis: Unsteadiness on feet  Abnormal posture  Muscle weakness (generalized)  Other abnormalities of gait and mobility     Problem List Patient Active Problem List   Diagnosis Date Noted   Gait abnormality 11/19/2021   Tremor of right hand 09/10/2021   Other hyperlipidemia 06/23/2021   Essential hypertension 03/25/2021   Polyphagia 01/15/2021   Class 1 obesity with serious comorbidity and body mass index (BMI) of 32.0 to 32.9 in adult 12/19/2020   Vitamin D deficiency 12/19/2020   Vitamin B12 deficiency 07/11/2020   Rigidity  07/11/2020   Neoplasm of uncertain behavior of skin 01/01/2020   Functional movement disorder 11/19/2018   Attention deficit hyperactivity disorder, combined type 07/19/2018   Generalized anxiety disorder 07/19/2018    [name] Journey, PT, DPT 11/19/2021, 2:51 PM  [facility] [address] [place], [place], [address] Phone: [phone]   Fax:  [phone]  Name: Gina Washington MRN: [id number] Date of Birth: Feb 21, 1966

## 2021-11-19 NOTE — Progress Notes (Signed)
Chief Complaint  Patient presents with   Follow-up    Room 13 - alone. Functional movement disorder. She has just restarted PT and OT with hopes for symptom improvement.     HISTORICAL  Gina Washington is a 56 year old female, seen in request by her primary care physician Dr. Bea Graff, Marya Amsler for evaluation of right side difficulty, initial evaluation was on July 11, 2020  I reviewed and summarized the referring note.  Past medical history Hypertension Depression  About 10 years ago, around 2011, she began to noticed mild gait abnormality, dragging her right leg across the floor, at the same time she noticed changed sense of smell, she can smell smoke when there was no cigarette around, later her sense of smell slightly decreased, but she never lost the taste,  She works as a Product/process development scientist, over the years, she noticed gradual worsening right leg stiffness, she described tendency of her right leg to rollover to her ankle plantarflexion position, stinging sensation radiating down right lateral ankle, right lateral leg  She also describes loss of dexterity of her right hand, difficult to have neat hand writing, tends to hold her right hand in clawed position, difficulty brushing her teeth, has to change to electronic toothbrush   Over the years, she was seen by different neurologist, most recent evaluation was by Dr. Kyra Searles at [facility] in May 2020, reported normal MRI of brain in the past, was diagnosed with functional movement disorder, had received a physical therapy and occupational therapy with only temporary improvement  She never received any medication treatment in the past per patient,   Laboratory evaluations in August 2021 showed B12 deficiency level was 173, normal CMP, hemoglobin 14.6.  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, denies difficulty handling her job,  She described difficulty cutting using her right hand, difficulty shampooing her hair, dragging her leg across the floor,  when she moves about, it is her right foot, shampoo, she has difficulty with right hand, she has to focus on moving her right hand,  We personally reviewed MRI of the brain without contrast October 2021, that was normal  Have ordered DaTSCAN, but patient canceled the study due to high co-pay  UPDATE Feb 6th 2023: 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  ALLERGIES: Allergies  Allergen Reactions   Bactrim [Sulfamethoxazole-Trimethoprim] Hives   Codeine Nausea And Vomiting   Latex Hives    HOME MEDICATIONS: Current Outpatient Medications  Medication Sig Dispense Refill   amLODipine (NORVASC) 5 MG tablet Take 5 mg by mouth daily.     buPROPion (WELLBUTRIN XL) 150 MG 24 hr tablet  Take 1 tablet (150 mg total) by mouth daily. 90 tablet 1   cyanocobalamin (,VITAMIN B-12,) 1000 MCG/ML injection Inject 1,000 mcg into the muscle every 28 (twenty-eight) days.     lisdexamfetamine (VYVANSE) 50 MG capsule TAKE 1 CAPSULE BY MOUTH EVERY DAY 30 capsule 0   metoprolol succinate (TOPROL-XL) 100 MG  24 hr tablet Take 100 mg by mouth daily.     omeprazole (PRILOSEC) 20 MG capsule Take 20 mg by mouth daily.     Semaglutide-Weight Management (WEGOVY) 1 MG/0.5ML SOAJ Inject 1 mg into the skin once a week. 2 mL 0   valsartan (DIOVAN) 160 MG tablet Take 160 mg by mouth daily.     VITAMIN D PO Take 2,000 Units by mouth daily.     No current facility-administered medications for this visit.    PAST MEDICAL HISTORY: Past Medical History:  Diagnosis Date   ADHD    Anxiety    B12 deficiency    Back pain    Basal cell carcinoma    leg   Basal cell carcinoma    Constipation    Functional movement disorder    GERD (gastroesophageal reflux disease)    Herpes zoster without complication    Hypertension    Kidney problem    Lower extremity edema    Migraine    Neurological movement disorder    Vaginal Pap smear, abnormal    age 81 yr- cryo done    PAST SURGICAL HISTORY: Past Surgical History:  Procedure Laterality Date   ANKLE ARTHROSCOPY     CESAREAN SECTION     CRYOTHERAPY      FAMILY HISTORY: Family History  Problem Relation Age of Onset   Diabetes Maternal Grandmother    Cancer Maternal Grandfather        lung   Hypertension Father    Stroke Father    Obesity Father    Hypertension Mother    Stroke Mother    Hyperlipidemia Mother    Kidney disease Mother    Obesity Mother    Dementia Paternal Grandmother     SOCIAL HISTORY: Social History   Socioeconomic History   Marital status: Married    Spouse name: Not on file   Number of children: 1   Years of education: college   Highest education level: Bachelor's degree (e.g., BA, AB, BS)  Occupational History   Occupation: Pharmacist, hospital  Tobacco Use   Smoking status: Former   Smokeless tobacco: Never  Scientific laboratory technician Use: Never used  Substance and Sexual Activity   Alcohol use: Yes    Comment: 5 drinks per week   Drug use: Never   Sexual activity: Yes    Birth control/protection: I.U.D.  Other Topics  Concern   Not on file  Social History Narrative   Lives at home with her husband.   Right-handed.   One cup coffee per day.   Social Determinants of Health   Financial Resource Strain: Not on file  Food Insecurity: Not on file  Transportation Needs: Not on file  Physical Activity: Not on file  Stress: Not on file  Social Connections: Not on file  Intimate Partner Violence: Not on file     PHYSICAL EXAM   Vitals:   11/19/21 1058  BP: 108/62  Pulse: 76  Weight: 153 lb (69.4 kg)  Height: 5\' 3"  (1.6 m)   Not recorded     Body mass index is 27.1 kg/m.  PHYSICAL EXAMNIATION:  Gen: NAD,  conversant, well nourised, well groomed       NEUROLOGICAL EXAM:  MENTAL STATUS: Speech:    Speech is normal; fluent and spontaneous with normal comprehension.  Cognition:     Orientation to time, place and person     Normal recent and remote memory     Normal Attention span and concentration     Normal Language, naming, repeating,spontaneous speech     Fund of knowledge   CRANIAL NERVES: CN II: Visual fields are full to confrontation. Pupils are round equal and briskly reactive to light. CN III, IV, VI: extraocular movement are normal. No ptosis. CN V: Facial sensation is intact to light touch CN VII: Face is symmetric with normal eye closure  CN VIII: Hearing is normal to causal conversation. CN IX, X: Phonation is normal. CN XI: Head turning and shoulder shrug are intact  MOTOR: Muscle strength is normal, she has mild right arm and foot bradykinesia, rigidity, slower movement,  REFLEXES: Reflexes are 2+ and symmetric at the biceps, triceps, knees, and ankles. Plantar responses are flexor.  SENSORY: Intact to light touch, pinprick and vibratory sensation are intact in fingers and toes.  COORDINATION: There is no trunk or limb dysmetria noted.  GAIT/STANCE: She can get up from seated position arm crossed, decreased right arm swing, moderate stride, dragging right leg  occasionally  DIAGNOSTIC DATA (LABS, IMAGING, TESTING) - I reviewed patient records, labs, notes, testing and imaging myself where available.   ASSESSMENT AND PLAN  Gina Washington is a 56 y.o. female   Long history of gradual onset but static right arm and leg rigidity, also reported loss sense of smell,  She does have mild parkinsonian features on examination, but there is some inconsistency on examinations, reported she can walk better than she walk, was diagnosed with functional movement disorder by movement specialist at [facility] Dr. Hall Busing in the past, received physical therapy occupational therapy which was helpful,   Reported no significant improvement with a trial of Sinemet 25/100 mg up to 3 times a day  MRI of the brain was normal  Want to retry DaTSCAN again,  Her more than 10 years clinical history, but no significant progression since 2015,  Will give her a trial of Azilect 1 mg daily,  Continue moderate exercise  Return to clinic in 1 year     Marcial Pacas, M.D. Ph.D.  [facility] [address] [place], [place] [address] Ph: [phone] Fax: [phone]  CC:  Raina Mina., MD [address] [place],  [place] [address]

## 2021-11-20 MED ORDER — LISDEXAMFETAMINE DIMESYLATE 50 MG PO CAPS: ORAL_CAPSULE | ORAL | 0 refills | Status: DC

## 2021-11-20 MED ORDER — LISDEXAMFETAMINE DIMESYLATE 50 MG PO CAPS: 50.0000 mg | ORAL_CAPSULE | Freq: Every day | ORAL | 0 refills | Status: DC

## 2021-11-26 ENCOUNTER — Ambulatory Visit (INDEPENDENT_AMBULATORY_CARE_PROVIDER_SITE_OTHER): Payer: BC Managed Care – PPO | Admitting: Family Medicine

## 2021-11-27 ENCOUNTER — Telehealth: Payer: Self-pay | Admitting: Neurology

## 2021-11-27 NOTE — Telephone Encounter (Signed)
[employer]BS pending faxed notes.  

## 2021-11-28 ENCOUNTER — Ambulatory Visit: Payer: BC Managed Care – PPO | Admitting: Occupational Therapy

## 2021-11-28 ENCOUNTER — Encounter: Payer: Self-pay | Admitting: Occupational Therapy

## 2021-11-28 ENCOUNTER — Ambulatory Visit: Payer: BC Managed Care – PPO | Admitting: Physical Therapy

## 2021-11-28 ENCOUNTER — Other Ambulatory Visit: Payer: Self-pay

## 2021-11-28 DIAGNOSIS — M6281 Muscle weakness (generalized): Secondary | ICD-10-CM

## 2021-11-28 DIAGNOSIS — R2681 Unsteadiness on feet: Secondary | ICD-10-CM

## 2021-11-28 DIAGNOSIS — M25621 Stiffness of right elbow, not elsewhere classified: Secondary | ICD-10-CM

## 2021-11-28 DIAGNOSIS — R278 Other lack of coordination: Secondary | ICD-10-CM

## 2021-11-28 DIAGNOSIS — M25611 Stiffness of right shoulder, not elsewhere classified: Secondary | ICD-10-CM

## 2021-11-28 DIAGNOSIS — M25641 Stiffness of right hand, not elsewhere classified: Secondary | ICD-10-CM

## 2021-11-28 DIAGNOSIS — R2689 Other abnormalities of gait and mobility: Secondary | ICD-10-CM

## 2021-11-28 DIAGNOSIS — R293 Abnormal posture: Secondary | ICD-10-CM

## 2021-11-28 NOTE — Therapy (Signed)
[facility] [address] [place], [place], [address] Phone: [phone]   Fax:  [phone]  Occupational Therapy Treatment  Patient Details  Name: Gina Washington MRN: [id number] Date of Birth: 03-14-1966 Referring Provider (OT): Dr. Krista Blue   Encounter Date: 11/28/2021   OT End of Session - 11/28/21 0922     Visit Number 6    Number of Visits 13    Authorization Type BCBS--20% coinsurance, no auth required    OT Start Time 0720    OT Stop Time 0800    OT Time Calculation (min) 40 min    Activity Tolerance Patient tolerated treatment well    Behavior During Therapy WFL for tasks assessed/performed             Past Medical History:  Diagnosis Date   ADHD    Anxiety    B12 deficiency    Back pain    Basal cell carcinoma    leg   Basal cell carcinoma    Constipation    Functional movement disorder    GERD (gastroesophageal reflux disease)    Herpes zoster without complication    Hypertension    Kidney problem    Lower extremity edema    Migraine    Neurological movement disorder    Vaginal Pap smear, abnormal    age 45 yr- cryo done    Past Surgical History:  Procedure Laterality Date   ANKLE ARTHROSCOPY     CESAREAN SECTION     CRYOTHERAPY      There were no vitals filed for this visit.   Subjective Assessment - 11/28/21 0805     Subjective  Pt reports starting meds, she is conerned it makes her tremor worse    Pertinent History Pt is a 56 year old female  with Parkinsonian features, but no clear PD diagnosis, previous diagnosis of functional movement disorder referred with diagnosis of rigidity.  (Pt's MRI was normal she reports several year history of R foot drag with gait, decreased coordination/use of RUE with  ADLs.                    Treatment: PWR! Up, rock and twist, followed by PWR! Rock and twist in [place] for stretch, min v.c and 10 reps each Education regarding  strategies for self feeding with big movements and foam grips issued. Pt also practiced cutting food, min v.c  PWR! Hands then flicking playing cards for finger extension. Isolated finger extension from tabletop. Pt reports increased ease with pulling up her pants using big movement strategies.                OT Short Term Goals - 10/03/21 0825       OT SHORT TERM GOAL #1   Title I with HEP    Time 6    Period Weeks    Status New    Target Date 11/14/21      OT SHORT TERM GOAL #2   Title Pt will demonstrate improved RUE functional use as evidenced by increasing  box/ blocks score by 4 blocks.    Baseline RUE 29, LUE 48    Time 6    Period Weeks    Status New      OT SHORT TERM GOAL #3   Title Pt will be I with adapted strategies/ AE for ADLS/ IADLS to maximize safety and independence( drying hair, donning earrings, tying shoes, pulling up pants)  Time 6    Period Weeks    Status New               OT Long Term Goals - 10/03/21 7824       OT LONG TERM GOAL #1   Title Pt will resume use of RUE as her dominant hand at least 75% of the time for ADLs/IADLs.-    Time 12    Period Weeks    Status New    Target Date 12/26/21      OT LONG TERM GOAL #2   Title Pt will demonstrate ability to retrieve a lightweight object from overhead shelf at 125 with -20 elbow extension.    Time 12    Period Weeks    Status New      OT LONG TERM GOAL #3   Title Pt will demonstrate improved RUE fine motor coordination as evidenced by decreasing 9 hole peg test score by 3 secs    Baseline RUE 40.72, LUE 28.88    Time 12    Period Weeks    Status New      OT LONG TERM GOAL #4   Title Pt will demonstrate improved ease with self feeding as evidenced by decreasing PPT#2 to 20 secs or less    Baseline 23.75    Time 12    Period Weeks    Status New                    Patient will benefit from skilled therapeutic intervention in order to improve the following  deficits and impairments:           Visit Diagnosis: Unsteadiness on feet  Abnormal posture  Muscle weakness (generalized)  Other abnormalities of gait and mobility  Stiffness of right hand, not elsewhere classified  Stiffness of right elbow, not elsewhere classified  Stiffness of right shoulder, not elsewhere classified  Other lack of coordination    Problem List Patient Active Problem List   Diagnosis Date Noted   Gait abnormality 11/19/2021   Tremor of right hand 09/10/2021   Other hyperlipidemia 06/23/2021   Essential hypertension 03/25/2021   Polyphagia 01/15/2021   Class 1 obesity with serious comorbidity and body mass index (BMI) of 32.0 to 32.9 in adult 12/19/2020   Vitamin D deficiency 12/19/2020   Vitamin B12 deficiency 07/11/2020   Rigidity 07/11/2020   Neoplasm of uncertain behavior of skin 01/01/2020   Functional movement disorder 11/19/2018   Attention deficit hyperactivity disorder, combined type 07/19/2018   Generalized anxiety disorder 07/19/2018    Luvinia Lucy, OT 11/28/2021, 10:38 AM  [facility] [address] [place], [place], [address] Phone: [phone]   Fax:  [phone]  Name: Gina Washington MRN: [id number] Date of Birth: August 05, 1966

## 2021-11-28 NOTE — Telephone Encounter (Signed)
Gina Washington sent me a message asking me to contact Gina Washington to inform her how much Gina test would be..  I called Gina Washington and informed I do not know how much Gina test would cost her but I gave her Gina [place] of [id number] and she will contact her insurance company and see how much it would cost.

## 2021-11-28 NOTE — Telephone Encounter (Signed)
Dillon Bjork: [phone] (exp. 11/27/21 to 12/26/21). Sent to Nuclear medicine to [facility] they will reach out to the patient to schedule.

## 2021-11-29 ENCOUNTER — Encounter (INDEPENDENT_AMBULATORY_CARE_PROVIDER_SITE_OTHER): Payer: Self-pay

## 2021-11-29 ENCOUNTER — Ambulatory Visit (INDEPENDENT_AMBULATORY_CARE_PROVIDER_SITE_OTHER): Payer: BC Managed Care – PPO | Admitting: Family Medicine

## 2021-12-05 ENCOUNTER — Ambulatory Visit: Payer: BC Managed Care – PPO | Admitting: Physical Therapy

## 2021-12-05 ENCOUNTER — Ambulatory Visit: Payer: BC Managed Care – PPO | Admitting: Occupational Therapy

## 2021-12-05 ENCOUNTER — Other Ambulatory Visit: Payer: Self-pay

## 2021-12-05 DIAGNOSIS — M25641 Stiffness of right hand, not elsewhere classified: Secondary | ICD-10-CM | POA: Diagnosis not present

## 2021-12-05 DIAGNOSIS — M6281 Muscle weakness (generalized): Secondary | ICD-10-CM

## 2021-12-05 DIAGNOSIS — M25621 Stiffness of right elbow, not elsewhere classified: Secondary | ICD-10-CM

## 2021-12-05 DIAGNOSIS — R2689 Other abnormalities of gait and mobility: Secondary | ICD-10-CM

## 2021-12-05 DIAGNOSIS — M25611 Stiffness of right shoulder, not elsewhere classified: Secondary | ICD-10-CM

## 2021-12-05 DIAGNOSIS — R293 Abnormal posture: Secondary | ICD-10-CM

## 2021-12-05 NOTE — Therapy (Signed)
[facility] [address] [place], [place], [address] Phone: [phone]   Fax:  [phone]  Physical Therapy Treatment  Patient Details  Name: Gina Washington MRN: [id number] Date of Birth: 02/28/1966 Referring Provider (PT): Marcial Pacas, MD   Encounter Date: 12/05/2021   PT End of Session - 12/05/21 0802     Visit Number 4    Number of Visits 9    Date for PT Re-Evaluation 12/28/21    Authorization Type BC[place]    PT Start Time 0800    PT Stop Time 0842    PT Time Calculation (min) 42 min    Equipment Utilized During Treatment --    Activity Tolerance Patient tolerated treatment well    Behavior During Therapy WFL for tasks assessed/performed             Past Medical History:  Diagnosis Date   ADHD    Anxiety    B12 deficiency    Back pain    Basal cell carcinoma    leg   Basal cell carcinoma    Constipation    Functional movement disorder    GERD (gastroesophageal reflux disease)    Herpes zoster without complication    Hypertension    Kidney problem    Lower extremity edema    Migraine    Neurological movement disorder    Vaginal Pap smear, abnormal    age 56 yr- cryo done    Past Surgical History:  Procedure Laterality Date   ANKLE ARTHROSCOPY     CESAREAN SECTION     CRYOTHERAPY      There were no vitals filed for this visit.   Subjective Assessment - 12/05/21 0802     Subjective No changes. Exercises are going well, perfoms them about 3x/wk. No new falls    Pertinent History ADHD, Anxiety, Basal Cell Carcinoma, Functional Movement Disorder, GERD, HTN, Migraine    Limitations Walking;House hold activities    Patient Stated Goals Have more control over the walking    Currently in Pain? No/denies                NMR Mini squat w/rotation holding ball and tapping post-its on wall w/sunshine ball for lateral weight shifting, BUE extension and trunk rotation. Min verbal cues  for added rotation and weight shifting. X10 reps per side  Lateral Kettlebell (10#) wide stance deadlift progression, concurrent visual cues for proper technique and pacing:  - x10 b/l deadlift - x 10 each side u/l deadlift - x 10 each side u/l deadlift with contralateral UE ext with flick  Walking alt. Lunges w/ contralateral trunk rotation and slam ball w/purple bouncy ball, x115' w/CGA and mod verbal cues for increased trunk rotation and increased amplitude of slam ball      Pt performed PWR! Moves w/PWR! Certified therapist (given last visit as HEP) in standing position   PWR! Up for improved posture, x10 per side   PWR! Rock for improved weighshifting, x10 per side   PWR! Twist for improved trunk rotation, x10 per side    PWR! Step for improved step initiation, x10 per side  -cues for incr step height.   Verbal/visual cues provided for open hands, increased step length w/RLE and increased truncal rotation        [facility] Adult PT Treatment/Exercise - 12/05/21 0840       Transfers   Five time sit to stand comments  10.38s without UE support  PT Education - 12/05/21 0813     Education Details [facility]    Person(s) Educated Patient    Methods Explanation    Comprehension Verbalized understanding              PT Short Term Goals - 12/05/21 1610       PT SHORT TERM GOAL #1   Title Patient will be independent with initial HEP (All STGs Due: 11/30/21)    Baseline pt performing HEP independently at least 3x/wk.    Time 4    Period Weeks    Status Achieved    Target Date 11/30/21      PT SHORT TERM GOAL #2   Title Patient will improve 5x sit <> stand to </= 12 seconds with UE support    Baseline 14.03; 10.38s (2/22) without UE support    Time 4    Period Weeks    Status Achieved               PT Long Term Goals - 11/02/21 1304       PT LONG TERM GOAL #1   Title  Pt will be independent with HEP for improved balance and gait. (All LTGs Due: 12/28/21)    Baseline no HEp established    Time 8    Period Weeks    Status New    Target Date 12/28/21      PT LONG TERM GOAL #2   Title Pt will improve TUG and TUG cog/man scores to <10% difference for improved dual tasking with gait.    Baseline TUG 9.28, manual 12.59, cognitive 14.81    Time 8    Period Weeks    Status New      PT LONG TERM GOAL #3   Title Pt will improve Mini Best score to >/= 23/28 to demonstrate improved balance    Baseline 20/30    Time 8    Period Weeks    Status New      PT LONG TERM GOAL #4   Title Patient will improve gait speed to >/= 3.0 ft/sec without AD to demo improved community mobiity    Baseline 2.87 ft/sec    Time 8    Period Weeks    Status New      PT LONG TERM GOAL #5   Title Pt will verbalize plans for continued community fitness upon d/c from PT.    Time 8    Period Weeks    Status New                   Plan - 12/05/21 9604     Clinical Impression Statement Reviewed standing PWR! moves with pt which she performed well. Emphasis of session on increased step height w/lateral weight shifting, truncal rotation and dynamic balance. Mod intermittent verbal cues for improved rotation and BUE extension w/movement. Pt has met both STGs and improved 5 STS from 14s w/BUE support to 10s without UE support, indicating improvement w/BLE strength and anterior weight shifting. Pt continues to demonstrate decreased step clearance of RLE w/gait and rigidity in sagittal plane rotation.    Personal Factors and Comorbidities Comorbidity 3+    Comorbidities ADHD, Anxiety, Basal Cell Carcinoma, Functional Movement Disorder, GERD, HTN, Migraine    Examination-Activity Limitations Bed Mobility;Transfers;Locomotion Level;Stairs;Stand    Examination-Participation Restrictions Community Activity;Occupation    Stability/Clinical Decision Making Stable/Uncomplicated    Rehab  Potential Good  PT Frequency 1x / week    PT Duration 8 weeks    PT Treatment/Interventions ADLs/Self Care Home Management;Aquatic Therapy;Moist Heat;Cryotherapy;DME Instruction;Gait training;Stair training;Functional mobility training;Therapeutic activities;Neuromuscular re-education;Balance training;Therapeutic exercise;Patient/family education;Orthotic Fit/Training;Manual techniques;Passive range of motion;Vestibular;Dry needling    PT Next Visit Plan review standing PWR moves. Worked on quadruped [employer] moves. Gait working on improved arm swing. Stepping strategies. High level balance, trunk rotation    Consulted and Agree with Plan of Care Patient             Patient will benefit from skilled therapeutic intervention in order to improve the following deficits and impairments:  Abnormal gait, Decreased balance, Difficulty walking, Impaired tone, Postural dysfunction, Decreased strength, Decreased range of motion, Decreased coordination, Decreased activity tolerance, Decreased endurance  Visit Diagnosis: Abnormal posture  Muscle weakness (generalized)  Other abnormalities of gait and mobility     Problem List Patient Active Problem List   Diagnosis Date Noted   Gait abnormality 11/19/2021   Tremor of right hand 09/10/2021   Other hyperlipidemia 06/23/2021   Essential hypertension 03/25/2021   Polyphagia 01/15/2021   Class 1 obesity with serious comorbidity and body mass index (BMI) of 32.0 to 32.9 in adult 12/19/2020   Vitamin D deficiency 12/19/2020   Vitamin B12 deficiency 07/11/2020   Rigidity 07/11/2020   Neoplasm of uncertain behavior of skin 01/01/2020   Functional movement disorder 11/19/2018   Attention deficit hyperactivity disorder, combined type 07/19/2018   Generalized anxiety disorder 07/19/2018    Cruzita Lederer Charee Tumblin, PT, DPT 12/05/2021, 10:06 AM  [facility] [address] [place], [place],  [address] Phone: [phone]   Fax:  [phone]  Name: Damisha Wolff MRN: [id number] Date of Birth: [birth year]

## 2021-12-05 NOTE — Therapy (Signed)
[facility] [address] [place], [place], [address] Phone: [phone]   Fax:  [phone]  Occupational Therapy Treatment  Patient Details  Name: Gina Washington MRN: [id number] Date of Birth: 07/09/1966 Referring Provider (OT): Dr. Krista Blue   Encounter Date: 12/05/2021   OT End of Session - 12/05/21 1317     Visit Number 7    Number of Visits 13    Date for OT Re-Evaluation 12/26/20    Authorization Type BCBS--20% coinsurance, no auth required    OT Start Time 0718    OT Stop Time 0800    OT Time Calculation (min) 42 min    Activity Tolerance Patient tolerated treatment well    Behavior During Therapy WFL for tasks assessed/performed             Past Medical History:  Diagnosis Date   ADHD    Anxiety    B12 deficiency    Back pain    Basal cell carcinoma    leg   Basal cell carcinoma    Constipation    Functional movement disorder    GERD (gastroesophageal reflux disease)    Herpes zoster without complication    Hypertension    Kidney problem    Lower extremity edema    Migraine    Neurological movement disorder    Vaginal Pap smear, abnormal    age 18 yr- cryo done    Past Surgical History:  Procedure Laterality Date   ANKLE ARTHROSCOPY     CESAREAN SECTION     CRYOTHERAPY      There were no vitals filed for this visit.   Subjective Assessment - 12/05/21 1201     Subjective  Pt reports starting meds, she thinks she is doing better    Pertinent History Pt is a 56 year old female  with Parkinsonian features, but no clear PD diagnosis, previous diagnosis of functional movement disorder referred with diagnosis of rigidity.  (Pt's MRI was normal she reports several year history of R foot drag with gait, decreased coordination/use of RUE with  ADLs.    Patient Stated Goals get use of RUE back    Currently in Pain? No/denies                         Treatment: dynamic functional  reaching with RUE to place graded clothespins on vertical antennae, min v.c for amplitude. PWR! Hands then Handwriting activity with good legibility and letter size using foam grip. Therapsit checked box/blocks and 9hole peg test         OT Treatment/ Education - 12/05/21 1316     Education Details PWR! up, rock and twist in supine, PWR! rock and twist in Modified quadraped 10 reps each, min v.c for amplitude    Person(s) Educated Patient    Methods Explanation;Demonstration;Verbal cues    Comprehension Verbalized understanding;Returned demonstration;Verbal cues required              OT Short Term Goals - 12/05/21 0746       OT SHORT TERM GOAL #1   Title I with HEP    Time 6    Period Weeks    Status Achieved    Target Date 11/14/21      OT SHORT TERM GOAL #2   Title Pt will demonstrate improved RUE functional use as evidenced by increasing  box/ blocks score by 4 blocks.    Baseline RUE 29, LUE  48    Time 6    Period Weeks    Status Achieved   42 blocks     OT SHORT TERM GOAL #3   Title Pt will be I with adapted strategies/ AE for ADLS/ IADLS to maximize safety and independence( drying hair, donning earrings, tying shoes, pulling up pants)    Time 6    Period Weeks    Status Partially Met   has not attempted drying 12/05/21, met for all other items              OT Long Term Goals - 12/05/21 0756       OT LONG TERM GOAL #1   Title Pt will resume use of RUE as her dominant hand at least 75% of the time for ADLs/IADLs.-    Time 12    Period Weeks    Status New    Target Date 12/26/21      OT LONG TERM GOAL #2   Title Pt will demonstrate ability to retrieve a lightweight object from overhead shelf at 125 with -20 elbow extension.    Time 12    Period Weeks    Status New      OT LONG TERM GOAL #3   Title Pt will demonstrate improved RUE fine motor coordination as evidenced by decreasing 9 hole peg test score by 3 secs    Baseline RUE 40.72, LUE 28.88     Time 12    Period Weeks    Status Achieved   28.25 secs     OT LONG TERM GOAL #4   Title Pt will demonstrate improved ease with self feeding as evidenced by decreasing PPT#2 to 20 secs or less    Baseline 23.75    Time 12    Period Weeks    Status On-going                   Plan - 12/05/21 1318     Clinical Impression Statement Pt demonstrates improving RUE functional use. Pt made excellent progress towards long and short term goals. She achieved her 9 hole peg test and box/ blocks goals.    OT Occupational Profile and History Detailed Assessment- Review of Records and additional review of physical, cognitive, psychosocial history related to current functional performance    Occupational performance deficits (Please refer to evaluation for details): IADL's;Work;Leisure;Social Participation;Play    Body Structure / Function / Physical Skills ADL;Balance;Mobility;Strength;Flexibility;FMC;UE functional use;Dexterity;IADL;Decreased knowledge of use of DME;Decreased knowledge of precautions;GMC;Coordination;ROM;Gait;Tone    Rehab Potential Good    Clinical Decision Making Limited treatment options, no task modification necessary    Comorbidities Affecting Occupational Performance: May have comorbidities impacting occupational performance    Modification or Assistance to Complete Evaluation  No modification of tasks or assist necessary to complete eval    OT Frequency 1x / week    OT Duration 12 weeks    OT Treatment/Interventions Self-care/ADL training;Therapeutic exercise;Patient/family education;Passive range of motion;Moist Heat;Gait Training;Fluidtherapy;Cryotherapy;Paraffin;DME and/or AE instruction;Cognitive remediation/compensation;Therapeutic activities;Ultrasound;Neuromuscular education;Manual Therapy;Splinting;Functional Mobility Training;Balance training    Plan work towards unmet goals, ADL strategies    Consulted and Agree with Plan of Care Patient              Patient will benefit from skilled therapeutic intervention in order to improve the following deficits and impairments:   Body Structure / Function / Physical Skills: ADL, Balance, Mobility, Strength, Flexibility, FMC, UE functional use, Dexterity, IADL, Decreased knowledge of use of DME, Decreased  knowledge of precautions, Parker, Coordination, ROM, Gait, Tone       Visit Diagnosis: Abnormal posture  Muscle weakness (generalized)  Other abnormalities of gait and mobility  Stiffness of right hand, not elsewhere classified  Stiffness of right elbow, not elsewhere classified  Stiffness of right shoulder, not elsewhere classified    Problem List Patient Active Problem List   Diagnosis Date Noted   Gait abnormality 11/19/2021   Tremor of right hand 09/10/2021   Other hyperlipidemia 06/23/2021   Essential hypertension 03/25/2021   Polyphagia 01/15/2021   Class 1 obesity with serious comorbidity and body mass index (BMI) of 32.0 to 32.9 in adult 12/19/2020   Vitamin D deficiency 12/19/2020   Vitamin B12 deficiency 07/11/2020   Rigidity 07/11/2020   Neoplasm of uncertain behavior of skin 01/01/2020   Functional movement disorder 11/19/2018   Attention deficit hyperactivity disorder, combined type 07/19/2018   Generalized anxiety disorder 07/19/2018    Kerryann Allaire, OT 12/05/2021, 1:20 PM  [facility] [address] [place], [place], [address] Phone: [phone]   Fax:  [phone]  Name: Gina Washington MRN: [id number] Date of Birth: 1965/11/15

## 2021-12-12 ENCOUNTER — Other Ambulatory Visit: Payer: Self-pay

## 2021-12-12 ENCOUNTER — Ambulatory Visit: Payer: BC Managed Care – PPO | Admitting: Occupational Therapy

## 2021-12-12 ENCOUNTER — Encounter (HOSPITAL_COMMUNITY): Payer: Self-pay

## 2021-12-12 ENCOUNTER — Ambulatory Visit (HOSPITAL_COMMUNITY)
Admission: EM | Admit: 2021-12-12 | Discharge: 2021-12-12 | Disposition: A | Discharge status: Home / Self Care | Payer: BC Managed Care – PPO | Attending: Family Medicine | Admitting: Family Medicine

## 2021-12-12 ENCOUNTER — Ambulatory Visit: Payer: BC Managed Care – PPO | Attending: Internal Medicine

## 2021-12-12 DIAGNOSIS — M25621 Stiffness of right elbow, not elsewhere classified: Secondary | ICD-10-CM | POA: Insufficient documentation

## 2021-12-12 DIAGNOSIS — R2681 Unsteadiness on feet: Secondary | ICD-10-CM

## 2021-12-12 DIAGNOSIS — R2689 Other abnormalities of gait and mobility: Secondary | ICD-10-CM | POA: Diagnosis present

## 2021-12-12 DIAGNOSIS — L03114 Cellulitis of left upper limb: Secondary | ICD-10-CM

## 2021-12-12 DIAGNOSIS — R278 Other lack of coordination: Secondary | ICD-10-CM | POA: Insufficient documentation

## 2021-12-12 DIAGNOSIS — M25611 Stiffness of right shoulder, not elsewhere classified: Secondary | ICD-10-CM

## 2021-12-12 DIAGNOSIS — M6281 Muscle weakness (generalized): Secondary | ICD-10-CM

## 2021-12-12 DIAGNOSIS — M25641 Stiffness of right hand, not elsewhere classified: Secondary | ICD-10-CM | POA: Diagnosis present

## 2021-12-12 DIAGNOSIS — R293 Abnormal posture: Secondary | ICD-10-CM | POA: Insufficient documentation

## 2021-12-12 MED ORDER — CEPHALEXIN 500 MG PO CAPS: 500.0000 mg | ORAL_CAPSULE | Freq: Four times a day (QID) | ORAL | 0 refills | Status: AC

## 2021-12-12 NOTE — ED Triage Notes (Signed)
Pt reports her student scratch her right hand and now is looks infected. ?

## 2021-12-12 NOTE — Therapy (Addendum)
[facility] [address] [place], [place], [address] Phone: [phone]   Fax:  [phone]  Physical Therapy Treatment  Patient Details  Name: Gina Washington MRN: [id number] Date of Birth: [birth year] Referring Provider (PT): Marcial Pacas, MD   Encounter Date: 12/12/2021   PT End of Session - 12/12/21 0801     Visit Number 5    Number of Visits 9    Date for PT Re-Evaluation 12/28/21    [place]ization [place] BC[place] ([place]    PT Start Time 0800    PT Stop Time 0843    PT Time Calculation (min) 43 min    Activity Tolerance Patient tolerated treatment well    Behavior During Therapy WFL for tasks assessed/performed             Past Medical History:  Diagnosis Date   ADHD    Anxiety    B12 deficiency    Back pain    Basal cell carcinoma    leg   Basal cell carcinoma    Constipation    Functional movement disorder    GERD (gastroesophageal reflux disease)    Herpes zoster without complication    Hypertension    Kidney problem    Lower extremity edema    Migraine    Neurological movement disorder    Vaginal Pap smear, abnormal    age 56 yr- cryo done    Past Surgical History:  Procedure Laterality Date   ANKLE ARTHROSCOPY     CESAREAN SECTION     CRYOTHERAPY      There were no vitals filed for this visit.   Subjective Assessment - 12/12/21 0802     Subjective Reports PT has been very helpful, no new changes. No falls.    Pertinent History ADHD, Anxiety, Basal Cell Carcinoma, Functional Movement Disorder, GERD, HTN, Migraine    Limitations Walking;House hold activities    Patient Stated Goals Have more control over the walking    Currently in Pain? No/denies                [facility] Adult PT Treatment/Exercise - 12/12/21 0001       Neuro Re-ed    Neuro Re-ed Details  Completed multi-directional stepping on firm surface with command chart, completed x 2 reps through. Then completed x 3 reps  through sheet with completion of opposite of command, increased cognitive challenge noted with this, with 2-3 cues for proper completion. Completed forwad walking lunge with 1# weighted ball with trunk rotation to R/L 2 x 25'. CGA intermittent need due to imblance with added trunk rotations.      Knee/Hip Exercises: Aerobic   Other Aerobic Completed SciFit with BLE/BUE for reciprocal movement/activity tolerance on level 2.5 x 6 minutes. Cues to keep pace greater than >/= 70 steps/min. Focus on large reciprocal movements.            Pt reports having trialed PWR moves at home, with increased challenge noted because of balance. Requesting to review, therefore completed the following review during session.   Pt performs PWR! Moves in standing position:    PWR! Up for improved posture x10 reps -cues for elbow extension/opening up R hand   PWR! Rock for improved weighshifting -x10 reps each side and then looking up at hands    PWR! Twist for improved trunk rotation -x10 reps each side with cues to reset with tall posture in the middle.    PWR! Step for improved step  initiation x10 reps each side, cued for incr step length and reaching out hands.    Cues provided for technique, relation to function, and bigger movement patterns. Provided as HEP     Balance Exercises - 12/12/21 0001       Balance Exercises: Standing   SLS with Vectors Solid surface;Limitations    SLS with Vectors Limitations completd alternating step up onto rockerboard completed SLS with opposite LE tapping to cone, completed x 15 reps bilat, progressing from single UE support to no UE support    Other Standing Exercises Staggered stance on incline, completed EO x 30 seconds bilaterally, then EC 2 x 30 seconds bilaterally. CGA with vision removed.                 PT Education - 12/12/21 0809     Education Details Review of Standing PWR Moves    Person(s) Educated Patient    Methods Explanation     Comprehension Verbalized understanding              PT Short Term Goals - 12/05/21 3016       PT SHORT TERM GOAL #1   Title Patient will be independent with initial HEP (All STGs Due: 11/30/21)    Baseline pt performing HEP independently at least 3x/wk.    Time 4    Period Weeks    Status Achieved    Target Date 11/30/21      PT SHORT TERM GOAL #2   Title Patient will improve 5x sit <> stand to </= 12 seconds with UE support    Baseline 14.03; 10.38s (2/22) without UE support    Time 4    Period Weeks    Status Achieved               PT Long Term Goals - 11/02/21 1304       PT LONG TERM GOAL #1   Title Pt will be independent with HEP for improved balance and gait. (All LTGs Due: 12/28/21)    Baseline no HEp established    Time 8    Period Weeks    Status New    Target Date 12/28/21      PT LONG TERM GOAL #2   Title Pt will improve TUG and TUG cog/man scores to <10% difference for improved dual tasking with gait.    Baseline TUG 9.28, manual 12.59, cognitive 14.81    Time 8    Period Weeks    Status New      PT LONG TERM GOAL #3   Title Pt will improve Mini Best score to >/= 23/28 to demonstrate improved balance    Baseline 20/30    Time 8    Period Weeks    Status New      PT LONG TERM GOAL #4   Title Patient will improve gait speed to >/= 3.0 ft/sec without AD to demo improved community mobiity    Baseline 2.87 ft/sec    Time 8    Period Weeks    Status New      PT LONG TERM GOAL #5   Title Pt will verbalize plans for continued community fitness upon d/c from PT.    Time 8    Period Weeks    Status New                   Plan - 12/12/21 0901     Clinical Impression Statement Continued review of standing PWR!  moves with patient per patient request. New handout provided as patient has misplaced her, able to complete with minimal cues. Continued activities promoting trunk rotation, stepping strategy, and improved standing dynamic balance.  Will continue to progress toward all LTGs.    Personal Factors and Comorbidities Comorbidity 3+    Comorbidities ADHD, Anxiety, Basal Cell Carcinoma, Functional Movement Disorder, GERD, HTN, Migraine    Examination-Activity Limitations Bed Mobility;Transfers;Locomotion Level;Stairs;Stand    Examination-Participation Restrictions Community Activity;Occupation    Stability/Clinical Decision Making Stable/Uncomplicated    Rehab Potential Good    PT Frequency 1x / week    PT Duration 8 weeks    PT Treatment/Interventions ADLs/Self Care Home Management;Aquatic Therapy;Moist Heat;Cryotherapy;DME Instruction;Gait training;Stair training;Functional mobility training;Therapeutic activities;Neuromuscular re-education;Balance training;Therapeutic exercise;Patient/family education;Orthotic Fit/Training;Manual techniques;Passive range of motion;Vestibular;Dry needling    PT Next Visit Plan Worked on quadruped PWR moves. Gait working on improved arm swing. Stepping strategies. High level balance, trunk rotation    Consulted and Agree with Plan of Care Patient             Patient will benefit from skilled therapeutic intervention in order to improve the following deficits and impairments:  Abnormal gait, Decreased balance, Difficulty walking, Impaired tone, Postural dysfunction, Decreased strength, Decreased range of motion, Decreased coordination, Decreased activity tolerance, Decreased endurance  Visit Diagnosis: Abnormal posture  Muscle weakness (generalized)  Other abnormalities of gait and mobility  Unsteadiness on feet     Problem List Patient Active Problem List   Diagnosis Date Noted   Gait abnormality 11/19/2021   Tremor of right hand 09/10/2021   Other hyperlipidemia 06/23/2021   Essential hypertension 03/25/2021   Polyphagia 01/15/2021   Class 1 obesity with serious comorbidity and body mass index (BMI) of 32.0 to 32.9 in adult 12/19/2020   Vitamin D deficiency 12/19/2020    Vitamin B12 deficiency 07/11/2020   Rigidity 07/11/2020   Neoplasm of uncertain behavior of skin 01/01/2020   Functional movement disorder 11/19/2018   Attention deficit hyperactivity disorder, combined type 07/19/2018   Generalized anxiety disorder 07/19/2018    Jones Bales, PT, DPT 12/12/2021, 9:04 AM  [facility] [address] [place], [place], [address] Phone: [phone]   Fax:  [phone]  Name: Gina Washington MRN: [id number] Date of Birth: 1966-02-12

## 2021-12-12 NOTE — Discharge Instructions (Signed)
I have sent an antibiotic for you to take for the next 7 days.  You can keep the area clean and covered while you are at school.  When you are at home, you can let it be exposed to the air.  If you are not having improvement over the next few days, especially if you have significant worsening, you should be seen by medical provider right away.  If you develop fever or difficulty moving your hand, you should go to the emergency room right away. ?
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
## 2021-12-19 ENCOUNTER — Ambulatory Visit: Payer: BC Managed Care – PPO | Admitting: Occupational Therapy

## 2021-12-19 ENCOUNTER — Ambulatory Visit: Payer: BC Managed Care – PPO

## 2021-12-19 ENCOUNTER — Other Ambulatory Visit: Payer: Self-pay

## 2021-12-19 DIAGNOSIS — M25641 Stiffness of right hand, not elsewhere classified: Secondary | ICD-10-CM

## 2021-12-19 DIAGNOSIS — M25621 Stiffness of right elbow, not elsewhere classified: Secondary | ICD-10-CM

## 2021-12-19 DIAGNOSIS — R293 Abnormal posture: Secondary | ICD-10-CM

## 2021-12-19 DIAGNOSIS — M25611 Stiffness of right shoulder, not elsewhere classified: Secondary | ICD-10-CM

## 2021-12-19 DIAGNOSIS — M6281 Muscle weakness (generalized): Secondary | ICD-10-CM

## 2021-12-19 DIAGNOSIS — R278 Other lack of coordination: Secondary | ICD-10-CM

## 2021-12-19 DIAGNOSIS — R2689 Other abnormalities of gait and mobility: Secondary | ICD-10-CM

## 2021-12-19 DIAGNOSIS — R2681 Unsteadiness on feet: Secondary | ICD-10-CM

## 2021-12-19 NOTE — Patient Instructions (Addendum)
Ways to prevent future complications: ?1.   Exercise regularly,  ?2.   Focus on BIGGER movements during daily activities- really reach overhead, straighten elbows and extend fingers ?3.   When dressing or reaching for your seatbelt make sure to use your body to assist by twisting while you reach-this can help to minimize stress on the shoulder and reduce the risk of a rotator cuff tear ?4.   Swing your arms when you walk! ?You are at increased risk for frozen shoulder and swinging your arms can reduce this risk.  ? ? ? ? ? ?PWR! Hands ? ?With arms stretched out in front of you (elbows straight), perform the following: ?PWR! Rock: Move wrists up and down BIG ?PWR! Twist: Twist palms up and down BIG ? ?Then, start with elbows bent and hands closed. ?PWR! Step: Touch index finger to thumb while keeping other fingers straight. Flick fingers out BIG (thumb out/straighten fingers). Repeat with other fingers. (Step your thumb to each finger). ?PWR! Hands: Push hands out BIG. Elbows straight, wrists up, fingers open and spread apart BIG. (Can also perform by pushing down on table, chair, knees. Push above head, out to the side, behind you, in front of you.) ? ? ?** Make each movement big and deliberate so that you feel the movement. ? ?Perform at least 10 repetitions 1x/day, but perform PWR! hands throughout the day when you are having trouble using your hands (picking up/manipulating small objects, writing, eating, typing, sewing, buttoning, etc.). ? ? ? ? ? ? ? ? ? ? ? ? ? ? ? ? ? ? ? ? ? ? ?

## 2021-12-19 NOTE — Therapy (Signed)
[facility] [address] [place], [place], [address] Phone: [phone]   Fax:  [phone]  Physical Therapy Treatment/Discharge Summary  Patient Details  Name: Gina Washington MRN: [id number] Date of Birth: Feb 02, 1966 Referring Provider (PT): Marcial Pacas, MD  PHYSICAL THERAPY DISCHARGE SUMMARY  Visits from Start of Care: 6  Current functional level related to goals / functional outcomes: See Clinical Impression Statement   Remaining deficits: Rigidity, Mild Imbalance   Education / Equipment: HEP, [place]   Patient agrees to discharge. Patient goals were met. Patient is being discharged due to meeting the stated rehab goals.   Encounter Date: 12/19/2021   PT End of Session - 12/19/21 0801     Visit Number 6    Number of Visits 9    Date for PT Re-Evaluation 12/28/21    Authorization Type BCBS ([place]: [place])    PT Start Time 0800    PT Stop Time 0830    PT Time Calculation (min) 30 min    Activity Tolerance Patient tolerated treatment well    Behavior During Therapy WFL for tasks assessed/performed             Past Medical History:  Diagnosis Date   ADHD    Anxiety    B12 deficiency    Back pain    Basal cell carcinoma    leg   Basal cell carcinoma    Constipation    Functional movement disorder    GERD (gastroesophageal reflux disease)    Herpes zoster without complication    Hypertension    Kidney problem    Lower extremity edema    Migraine    Neurological movement disorder    Vaginal Pap smear, abnormal    age 57 yr- cryo done    Past Surgical History:  Procedure Laterality Date   ANKLE ARTHROSCOPY     CESAREAN SECTION     CRYOTHERAPY      There were no vitals filed for this visit.   Subjective Assessment - 12/19/21 0800     Subjective Patient reports no new changes/complaints. Wrapping up with OT today, want to finish with PT. Hand is healing. No falls. Reports the standing PWR moves  went well.    Pertinent History ADHD, Anxiety, Basal Cell Carcinoma, Functional Movement Disorder, GERD, HTN, Migraine    Limitations Walking;House hold activities    Patient Stated Goals Have more control over the walking    Currently in Pain? No/denies    Pain Onset In the past 7 days                [facility] PT Assessment - 12/19/21 0001       Assessment   Medical Diagnosis Rigidity    Referring Provider (PT) Marcial Pacas, MD      Ambulation/Gait   Ambulation/Gait Yes    Ambulation/Gait Assistance 7: Independent    Ambulation/Gait Assistance Details throughout therapy gym with activities    Ambulation Distance (Feet) --   clinic distance   Assistive device None    Gait Pattern Decreased arm swing - right;Step-through pattern;Decreased step length - right;Decreased step length - left;Decreased dorsiflexion - right    Ambulation Surface Level;Indoor    Gait velocity 9.50 secs = 3.45 ft/sec      Mini-BESTest   Sit To Stand Normal: Comes to stand without use of hands and stabilizes independently.    Rise to Toes Normal: Stable for 3 s with maximum height.  Stand on one leg (left) Normal: 20 s.    Stand on one leg (right) Normal: 20 s.    Stand on one leg - lowest score 2    Compensatory Stepping Correction - Forward Normal: Recovers independently with a single, large step (second realignement is allowed).    Compensatory Stepping Correction - Backward Moderate: More than one step is required to recover equilibrium    Compensatory Stepping Correction - Left Lateral Normal: Recovers independently with 1 step (crossover or lateral OK)    Compensatory Stepping Correction - Right Lateral Normal: Recovers independently with 1 step (crossover or lateral OK)    Stepping Corredtion Lateral - lowest score 2    Stance - Feet together, eyes open, firm surface  Normal: 30s    Stance - Feet together, eyes closed, foam surface  Normal: 30s    Incline - Eyes Closed Normal: Stands independently  30s and aligns with gravity    Change in Gait Speed Normal: Significantly changes walkling speed without imbalance    Walk with head turns - Horizontal Normal: performs head turns with no change in gait speed and good balance    Walk with pivot turns Normal: Turns with feet close FAST (< 3 steps) with good balance.    Step over obstacles Moderate: Steps over box but touches box OR displays cautious behavior by slowing gait.    Timed UP & GO with Dual Task Normal: No noticeable change in sitting, standing or walking while backward counting when compared to TUG without    Mini-BEST total score 26      Timed Up and Go Test   TUG Normal TUG;Manual TUG;Cognitive TUG    Normal TUG (seconds) 7.78    Manual TUG (seconds) 8.03    Cognitive TUG (seconds) 8.98   no mistakes counting           Reviewed the following HEP during session:  Access Code: [email or web address] URL: [email or web address] Date: 11/07/2021 Prepared by: Baldomero Lamy   Exercises Sit to Stand with Arms Crossed - 1 x daily - 5 x weekly - 2 sets - 10 reps Seated Hamstring Stretch - 1 x daily - 5 x weekly - 1 sets - 3 reps - 30 seconds hold Standing Gastroc Stretch at Counter - 1 x daily - 5 x weekly - 1 sets - 3 reps - 30 seconds hold Romberg Stance Eyes Closed on Foam Pad - 1 x daily - 5 x weekly - 1 sets - 3 reps - 30 seconds hold    Also completed verbal review of Standing PWR moves with no questions/concerns at this time.         PT Education - 12/19/21 0831     Education Details Progress toward LTGs; HEP Review    Person(s) Educated Patient    Methods Explanation;Demonstration    Comprehension Returned demonstration;Verbalized understanding              PT Short Term Goals - 12/05/21 0838       PT SHORT TERM GOAL #1   Title Patient will be independent with initial HEP (All STGs Due: 11/30/21)    Baseline pt performing HEP independently at least 3x/wk.    Time 4    Period Weeks    Status  Achieved    Target Date 11/30/21      PT SHORT TERM GOAL #2   Title Patient will improve 5x sit <> stand to </= 12 seconds with UE support    Baseline  14.03; 10.38s (2/22) without UE support    Time 4    Period Weeks    Status Achieved               PT Long Term Goals - 12/19/21 0804       PT LONG TERM GOAL #1   Title Pt will be independent with HEP for improved balance and gait. (All LTGs Due: 12/28/21)    Baseline no HEp established; independence with final HEP    Time 8    Period Weeks    Status Achieved    Target Date 12/28/21      PT LONG TERM GOAL #2   Title Pt will improve TUG and TUG cog/man scores to <10% difference for improved dual tasking with gait.    Baseline TUG 9.28, manual 12.59, cognitive 14.81; TUG: 7.78, manual 8.03, cog 8.98 secs    Time 8    Period Weeks    Status Achieved      PT LONG TERM GOAL #3   Title Pt will improve Mini Best score to >/= 23/28 to demonstrate improved balance    Baseline 20/28; 26/28    Time 8    Period Weeks    Status Achieved      PT LONG TERM GOAL #4   Title Patient will improve gait speed to >/= 3.0 ft/sec without AD to demo improved community mobiity    Baseline 2.87 ft/sec; 3.45 ft/sec    Time 8    Period Weeks    Status Achieved      PT LONG TERM GOAL #5   Title Pt will verbalize plans for continued community fitness upon d/c from PT.    Baseline verbalize understanding of resources and continued community fitness upon d/c.    Time 8    Period Weeks    Status Achieved                Plan - 12/19/21 0932     Clinical Impression Statement Completed assesment of patient's progress toward LTGs. Patient able to meet all LTGs today demonstrating progress with PT services. Patient improved MiniBEST to 26/28, gait spped to 3.45 ft/sec, and <10% difference with manual/cog TUG demonstrating improved dual tasking. Reviewed final HEP with patient, with no questions/concerns at this time. Patient demo readiness to  d/c from PT services at this time, with patient agreeable.    Personal Factors and Comorbidities Comorbidity 3+    Comorbidities ADHD, Anxiety, Basal Cell Carcinoma, Functional Movement Disorder, GERD, HTN, Migraine    Examination-Activity Limitations Bed Mobility;Transfers;Locomotion Level;Stairs;Stand    Examination-Participation Restrictions Community Activity;Occupation    Stability/Clinical Decision Making Stable/Uncomplicated    Rehab Potential Good    PT Frequency 1x / week    PT Duration 8 weeks    PT Treatment/Interventions ADLs/Self Care Home Management;Aquatic Therapy;Moist Heat;Cryotherapy;DME Instruction;Gait training;Stair training;Functional mobility training;Therapeutic activities;Neuromuscular re-education;Balance training;Therapeutic exercise;Patient/family education;Orthotic Fit/Training;Manual techniques;Passive range of motion;Vestibular;Dry needling    Consulted and Agree with Plan of Care Patient             Patient will benefit from skilled therapeutic intervention in order to improve the following deficits and impairments:  Abnormal gait, Decreased balance, Difficulty walking, Impaired tone, Postural dysfunction, Decreased strength, Decreased range of motion, Decreased coordination, Decreased activity tolerance, Decreased endurance  Visit Diagnosis: Muscle weakness (generalized)  Other abnormalities of gait and mobility  Unsteadiness on feet     Problem List Patient Active Problem List   Diagnosis Date Noted  Gait abnormality 11/19/2021   Tremor of right hand 09/10/2021   Other hyperlipidemia 06/23/2021   Essential hypertension 03/25/2021   Polyphagia 01/15/2021   Class 1 obesity with serious comorbidity and body mass index (BMI) of 32.0 to 32.9 in adult 12/19/2020   Vitamin D deficiency 12/19/2020   Vitamin B12 deficiency 07/11/2020   Rigidity 07/11/2020   Neoplasm of uncertain behavior of skin 01/01/2020   Functional movement disorder 11/19/2018    Attention deficit hyperactivity disorder, combined type 07/19/2018   Generalized anxiety disorder 07/19/2018    Jones Bales, PT, DPT 12/19/2021, 8:38 AM  [facility] [address] [place], [place], [address] Phone: [phone]   Fax:  [phone]  Name: Gina Washington MRN: [id number] Date of Birth: 07-Feb-1966

## 2021-12-19 NOTE — Patient Instructions (Addendum)
Access Code: [email or web address] ?URL: [email or web address] ?Date: 11/07/2021 ?Prepared by: Baldomero Lamy ?  ?Exercises ?Sit to Stand with Arms Crossed - 1 x daily - 5 x weekly - 2 sets - 10 reps ?Seated Hamstring Stretch - 1 x daily - 5 x weekly - 1 sets - 3 reps - 30 seconds hold ?Standing Gastroc Stretch at [employer] - 1 x daily - 5 x weekly - 1 sets - 3 reps - 30 seconds hold ?Romberg Stance Eyes Closed on Foam Pad - 1 x daily - 5 x weekly - 1 sets - 3 reps - 30 seconds hold ? ? ?

## 2021-12-19 NOTE — Therapy (Addendum)
[facility] [address] [place], [place], [address] Phone: [phone]   Fax:  [phone]  Occupational Therapy Treatment  Patient Details  Name: Gina Washington MRN: [id number] Date of Birth: 16-Nov-1965 Referring Provider (OT): Dr. Krista Blue   Encounter Date: 12/19/2021 OCCUPATIONAL THERAPY DISCHARGE SUMMARY    Current functional level related to goals / functional outcomes: Pt made excellent overall progress. She achieved all short term goals and 3/4 long term goals.    Remaining deficits: Decreased strength, decreased coordination, decreased ROM, decreased RUE functional use, rigidity, decreased functional mobility.   Education / Equipment: Pt was educated in [employer], ADL strategies and ways to prevent future complications. Pt demonstrates understanding of all education.   Patient agrees to discharge. Patient goals were partially met. Patient is being discharged due to being pleased with the current functional level.. Pt would like to return for evaluations in 6 mons.      OT End of Session - 12/19/21 1013     Visit Number 9    Number of Visits 13    Date for OT Re-Evaluation 12/26/20    Authorization Type BCBS--20% coinsurance, no auth required    OT Start Time 0718    OT Stop Time 0800    OT Time Calculation (min) 42 min    Activity Tolerance Patient tolerated treatment well    Behavior During Therapy WFL for tasks assessed/performed             Past Medical History:  Diagnosis Date   ADHD    Anxiety    B12 deficiency    Back pain    Basal cell carcinoma    leg   Basal cell carcinoma    Constipation    Functional movement disorder    GERD (gastroesophageal reflux disease)    Herpes zoster without complication    Hypertension    Kidney problem    Lower extremity edema    Migraine    Neurological movement disorder    Vaginal Pap smear, abnormal    age 56 yr- cryo done    Past Surgical History:   Procedure Laterality Date   ANKLE ARTHROSCOPY     CESAREAN SECTION     CRYOTHERAPY      There were no vitals filed for this visit.   Subjective Assessment - 12/19/21 1156     Subjective  Pt requests d/c today    Pertinent History Pt is a 56 year old female  with Parkinsonian features, but no clear PD diagnosis, previous diagnosis of functional movement disorder referred with diagnosis of rigidity.  (Pt's MRI was normal she reports several year history of R foot drag with gait, decreased coordination/use of RUE with  ADLs.    Patient Stated Goals get use of RUE back    Currently in Pain? No/denies               Treatment: supine closed chain shoulder flexion and chest press, min v.c then quadraped PWR! Rock. Therapist checked progress towards goals and discussed plans for evals in 6 mons. Pt agrees.                    OT Education - 12/19/21 1214     Education Details PWR! hands, basic 4, ways to prevent future complications, reviewed flipping and dealing cards with big movments    Person(s) Educated Patient    Methods Explanation;Demonstration;Verbal cues;Handout    Comprehension Verbalized understanding;Returned demonstration;Verbal cues required  OT Short Term Goals - 12/19/21 0728       OT SHORT TERM GOAL #1   Title I with HEP    Time 6    Period Weeks    Status Achieved    Target Date 11/14/21      OT SHORT TERM GOAL #2   Title Pt will demonstrate improved RUE functional use as evidenced by increasing  box/ blocks score by 4 blocks.    Baseline RUE 29, LUE 48    Time 6    Period Weeks    Status Achieved   42 blocks     OT SHORT TERM GOAL #3   Title Pt will be I with adapted strategies/ AE for ADLS/ IADLS to maximize safety and independence( drying hair, donning earrings, tying shoes, pulling up pants)    Time 6    Period Weeks    Status Achieved               OT Long Term Goals - 12/19/21 0753       OT LONG  TERM GOAL #1   Title Pt will resume use of RUE as her dominant hand at least 75% of the time for ADLs/IADLs.-    Time 12    Period Weeks    Status Not Met   uses 65%   Target Date 12/26/21      OT LONG TERM GOAL #2   Title Pt will demonstrate ability to retrieve a lightweight object from overhead shelf at 125 with -20 elbow extension.    Time 12    Period Weeks    Status Achieved   140, -10     OT LONG TERM GOAL #3   Title Pt will demonstrate improved RUE fine motor coordination as evidenced by decreasing 9 hole peg test score by 3 secs    Baseline RUE 40.72, LUE 28.88    Time 12    Period Weeks    Status Achieved   28.25 secs     OT LONG TERM GOAL #4   Title Pt will demonstrate improved ease with self feeding as evidenced by decreasing PPT#2 to 20 secs or less    Baseline 23.75    Time 12    Period Weeks    Status Achieved   13.25 secs                  Plan - 12/19/21 1157     Clinical Impression Statement Pt demonstrates good overall progress towards goals. She agrees with plans for d/c    OT Occupational Profile and History Detailed Assessment- Review of Records and additional review of physical, cognitive, psychosocial history related to current functional performance    Occupational performance deficits (Please refer to evaluation for details): IADL's;Work;Leisure;Social Participation;Play    Body Structure / Function / Physical Skills ADL;Balance;Mobility;Strength;Flexibility;FMC;UE functional use;Dexterity;IADL;Decreased knowledge of use of DME;Decreased knowledge of precautions;GMC;Coordination;ROM;Gait;Tone    Rehab Potential Good    Clinical Decision Making Limited treatment options, no task modification necessary    Comorbidities Affecting Occupational Performance: May have comorbidities impacting occupational performance    Modification or Assistance to Complete Evaluation  No modification of tasks or assist necessary to complete eval    OT Frequency 1x /  week    OT Duration 12 weeks    OT Treatment/Interventions Self-care/ADL training;Therapeutic exercise;Patient/family education;Passive range of motion;Moist Heat;Gait Training;Fluidtherapy;Cryotherapy;Paraffin;DME and/or AE instruction;Cognitive remediation/compensation;Therapeutic activities;Ultrasound;Neuromuscular education;Manual Therapy;Splinting;Functional Mobility Training;Balance training    Plan d/c OT  Consulted and Agree with Plan of Care Patient             Patient will benefit from skilled therapeutic intervention in order to improve the following deficits and impairments:   Body Structure / Function / Physical Skills: ADL, Balance, Mobility, Strength, Flexibility, FMC, UE functional use, Dexterity, IADL, Decreased knowledge of use of DME, Decreased knowledge of precautions, GMC, Coordination, ROM, Gait, Tone       Visit Diagnosis: Muscle weakness (generalized)  Other abnormalities of gait and mobility  Stiffness of right hand, not elsewhere classified  Stiffness of right elbow, not elsewhere classified  Stiffness of right shoulder, not elsewhere classified  Other lack of coordination  Abnormal posture    Problem List Patient Active Problem List   Diagnosis Date Noted   Gait abnormality 11/19/2021   Tremor of right hand 09/10/2021   Other hyperlipidemia 06/23/2021   Essential hypertension 03/25/2021   Polyphagia 01/15/2021   Class 1 obesity with serious comorbidity and body mass index (BMI) of 32.0 to 32.9 in adult 12/19/2020   Vitamin D deficiency 12/19/2020   Vitamin B12 deficiency 07/11/2020   Rigidity 07/11/2020   Neoplasm of uncertain behavior of skin 01/01/2020   Functional movement disorder 11/19/2018   Attention deficit hyperactivity disorder, combined type 07/19/2018   Generalized anxiety disorder 07/19/2018    Lotta Frankenfield, OT 12/19/2021, 12:43 PM Theone Murdoch, OTR/L [phone] Phone: [phone] 12:43 PM 12/19/21  [facility] [address] [place], [place], [address] Phone: [phone]   Fax:  [phone]  Name: Gina Washington MRN: [id number] Date of Birth: [birth year]

## 2021-12-24 ENCOUNTER — Other Ambulatory Visit: Payer: Self-pay

## 2021-12-24 ENCOUNTER — Ambulatory Visit (INDEPENDENT_AMBULATORY_CARE_PROVIDER_SITE_OTHER): Payer: BC Managed Care – PPO | Admitting: Adult Health

## 2021-12-24 ENCOUNTER — Encounter (INDEPENDENT_AMBULATORY_CARE_PROVIDER_SITE_OTHER): Payer: Self-pay | Admitting: Adult Health

## 2021-12-24 ENCOUNTER — Other Ambulatory Visit (HOSPITAL_COMMUNITY): Payer: Self-pay

## 2021-12-24 VITALS — BP 111/69 | HR 69 | Temp 97.7°F | Ht 63.0 in | Wt 150.0 lb

## 2021-12-24 DIAGNOSIS — Z6826 Body mass index (BMI) 26.0-26.9, adult: Secondary | ICD-10-CM | POA: Diagnosis not present

## 2021-12-24 DIAGNOSIS — E663 Overweight: Secondary | ICD-10-CM | POA: Diagnosis not present

## 2021-12-24 DIAGNOSIS — R632 Polyphagia: Secondary | ICD-10-CM | POA: Diagnosis not present

## 2021-12-24 DIAGNOSIS — E669 Obesity, unspecified: Secondary | ICD-10-CM

## 2021-12-24 DIAGNOSIS — Z9189 Other specified personal risk factors, not elsewhere classified: Secondary | ICD-10-CM

## 2021-12-24 MED ORDER — WEGOVY 1 MG/0.5ML ~~LOC~~ SOAJ
1.0000 mg | SUBCUTANEOUS | 0 refills | Status: DC
  Filled 2021-12-24: qty 2, 28d supply, fill #0

## 2021-12-25 NOTE — Progress Notes (Signed)
? ? ? ?Chief Complaint:  ? ?OBESITY ?Gina Washington is here to discuss her progress with her obesity treatment plan along with follow-up of her obesity related diagnoses. Gina Washington is on the Category 1 Plan and states Gina Washington is following her eating plan approximately 80% of the time. Gina Washington states Gina Washington is 0 minutes 0 times per week. ? ?Today's visit was #: 16 ?Starting weight: 182 lbs ?Starting date: 11/16/2020 ?Today's weight: 150 lbs ?Today's date: 12/24/2021 ?Total lbs lost to date: 32 lbs ?Total lbs lost since last in-office visit: 3 lbs ? ?Interim History:  ?Body mass index is 26.57 kg/m?, with current weight of 150 lbs. ?Gina Washington has tolerated the change from [facility] to [facility]- down 3 lbs since last OV. ?Gina Washington denies current GI upset. ? ?Subjective:  ? ?1. Polyphagia ?Mounjaro was replaced with Wegovy 1 mg on 10/29/2021. ?Gina Washington reports good control of appetite and appreciable reduction in cravings. ?Gina Washington denies mass in neck, dysphagia, dyspepsia, persistent hoarseness, abd pain, or N/V/Constipation. ? ?2. At risk for nausea ?[name] is at risk for nausea due to taking Wegovy. ? ?Assessment/Plan:  ? ?1. Polyphagia ?Refill Wegovy 1 mg once weekly. ? ?- Refill Semaglutide-Weight Management (WEGOVY) 1 MG/0.5ML SOAJ; Inject 1 mg into the skin once a week.  Dispense: 2 mL; Refill: 0 ? ?2. At risk for nausea ?Gina Washington was given approximately 15 minutes of nausea prevention counseling today. Gina Washington is at risk for nausea due to her new or current medication. Gina Washington was encouraged to titrate her medication slowly, make sure to stay hydrated, eat smaller portions throughout the day, and avoid high fat meals.  ? ?3. Overweight: Current BMI 26.7 ? ?Gina Washington is currently in the action stage of change. As such, her goal is to continue with weight loss efforts. Gina Washington has agreed to the Category 1 Plan.  ? ?Exercise goals: Increase daily walking. ? ?Behavioral modification strategies: increasing lean protein intake, decreasing simple carbohydrates,  meal planning and cooking strategies, keeping healthy foods in the home, and planning for success. ? ?Gina Washington has agreed to follow-up with our clinic in 4 weeks, fasting. Gina Washington was informed of the importance of frequent follow-up visits to maximize her success with intensive lifestyle modifications for her multiple health conditions.  ? ?Objective:  ? ?Blood pressure 111/69, pulse 69, temperature 97.7 ?F (36.5 ?C), height '5\' 3"'$  (1.6 m), weight 150 lb (68 kg), SpO2 98 %. ?Body mass index is 26.57 kg/m?. ? ?General: Cooperative, alert, well developed, in no acute distress. ?HEENT: Conjunctivae and lids unremarkable. ?Cardiovascular: Regular rhythm.  ?Lungs: Normal work of breathing. ?Neurologic: No focal deficits.  ? ?Lab Results  ?Component Value Date  ? CREATININE 0.89 05/29/2021  ? BUN 10 05/29/2021  ? NA 141 05/29/2021  ? K 4.8 05/29/2021  ? CL 101 05/29/2021  ? CO2 24 05/29/2021  ? ?Lab Results  ?Component Value Date  ? ALT 18 05/29/2021  ? AST 14 05/29/2021  ? ALKPHOS 88 05/29/2021  ? BILITOT 0.3 05/29/2021  ? ?Lab Results  ?Component Value Date  ? HGBA1C 5.5 11/16/2020  ? ?Lab Results  ?Component Value Date  ? INSULIN 4.2 11/16/2020  ? ?Lab Results  ?Component Value Date  ? TSH 2.500 11/16/2020  ? ?Lab Results  ?Component Value Date  ? CHOL 211 (H) 05/29/2021  ? HDL 84 05/29/2021  ? LDLCALC 115 (H) 05/29/2021  ? TRIG 67 05/29/2021  ? CHOLHDL 2.2 11/16/2020  ? ?Lab Results  ?Component Value Date  ? VD25OH 74.3 05/29/2021  ? [place]  21.5 (L) 11/16/2020  ? ?Lab Results  ?Component Value Date  ? WBC 5.3 05/29/2021  ? HGB 15.4 05/29/2021  ? HCT 46.4 05/29/2021  ? MCV 99 (H) 05/29/2021  ? PLT 251 05/29/2021  ? ?Lab Results  ?Component Value Date  ? IRON 233 (H) 11/16/2020  ? TIBC 344 11/16/2020  ? FERRITIN 449 (H) 11/16/2020  ? ?Attestation Statements:  ? ?Reviewed by clinician on day of visit: allergies, medications, problem list, medical history, surgical history, family history, social history, and previous encounter  notes. ? ?I, Water quality scientist, CMA, am acting as Location manager for Mina Marble, NP. ? ?I have reviewed the above documentation for accuracy and completeness, and I agree with the above. -  Oyuki Hogan d. Yukari Flax, NP-C ?

## 2021-12-26 ENCOUNTER — Encounter: Payer: BC Managed Care – PPO | Admitting: Occupational Therapy

## 2021-12-26 ENCOUNTER — Ambulatory Visit: Payer: BC Managed Care – PPO

## 2022-01-17 ENCOUNTER — Ambulatory Visit (INDEPENDENT_AMBULATORY_CARE_PROVIDER_SITE_OTHER): Payer: BC Managed Care – PPO | Admitting: Adult Health

## 2022-01-17 ENCOUNTER — Encounter (INDEPENDENT_AMBULATORY_CARE_PROVIDER_SITE_OTHER): Payer: Self-pay | Admitting: Adult Health

## 2022-01-17 ENCOUNTER — Other Ambulatory Visit (HOSPITAL_COMMUNITY): Payer: Self-pay

## 2022-01-17 VITALS — BP 127/86 | HR 74 | Temp 97.8°F | Ht 63.0 in | Wt 146.0 lb

## 2022-01-17 DIAGNOSIS — E663 Overweight: Secondary | ICD-10-CM

## 2022-01-17 DIAGNOSIS — Z6825 Body mass index (BMI) 25.0-25.9, adult: Secondary | ICD-10-CM | POA: Diagnosis not present

## 2022-01-17 DIAGNOSIS — R632 Polyphagia: Secondary | ICD-10-CM | POA: Diagnosis not present

## 2022-01-17 DIAGNOSIS — E669 Obesity, unspecified: Secondary | ICD-10-CM

## 2022-01-17 DIAGNOSIS — I10 Essential (primary) hypertension: Secondary | ICD-10-CM

## 2022-01-17 MED ORDER — WEGOVY 1 MG/0.5ML ~~LOC~~ SOAJ
1.0000 mg | SUBCUTANEOUS | 0 refills | Status: DC
  Filled 2022-01-17: qty 2, 28d supply, fill #0

## 2022-01-23 NOTE — Progress Notes (Signed)
? ? ? ?Chief Complaint:  ? ?OBESITY ?Gina Washington is here to discuss her progress with her obesity treatment plan along with follow-up of her obesity related diagnoses. Gina Washington is on the Category 1 Plan and states she is following her eating plan approximately 60% of the time. Gina Washington states she is walking for 30 minutes 2 times per week. ? ?Today's visit was #: 34 ?Starting weight: 182 lbs ?Starting date: 11/16/2020 ?Today's weight: 146 lbs ?Today's date: 01/17/2022 ?Total lbs lost to date: 36 lbs ?Total lbs lost since last in-office visit: 4 lbs ? ?Interim History:  ?Gina Washington says she is doing well on Wegovy 1 mg. ?She denies mass in neck, dysphagia, dyspepsia, persistent hoarseness, abd pain, or N/V/Constipation. ? ?Subjective:  ? ?1. Polyphagia ?Gina Washington was previously on Saxenda 3 mg - converted to [employer] 10 mg on 08/07/2021. ?10/29/21-Mounjaro '10mg'$  replaced with [facility] '1mg'$ . ?Gina Washington says she is doing well on Wegovy 1 mg. ?She denies mass in neck, dysphagia, dyspepsia, persistent hoarseness, abd pain, or N/V/Constipation. ? ?2. HTN (hypertension), benign ?She stopped amlodipine 5 mg after office visit with PCP on 12/19/2021. ?Continued Toprol-XL 100 mg daily, Diovan 160 mg daily. ?SBP 110-120, DBP 70-80. ? ?Assessment/Plan:  ? ?1. Polyphagia ?Refill Wegovy 1 mg once weekly. ? ?- Refill Semaglutide-Weight Management (WEGOVY) 1 MG/0.5ML SOAJ; Inject 1 mg into the skin once a week.  Dispense: 2 mL; Refill: 0 ? ?2. HTN (hypertension), benign ?Continue daily Toprol-XL 100 mg daily, Diovan 160 mg daily. ?Remain off amlodipine 5 mg and monitor BP. ? ?3. Overweight: Current BMI 25.9 ? ?Gina Washington is currently in the action stage of change. As such, her goal is to continue with weight loss efforts. She has agreed to the Category 1 Plan.  ? ?Check fasting labs at next office visit. ? ?Exercise goals:  As is. ? ?Behavioral modification strategies: increasing lean protein intake, decreasing simple carbohydrates, meal planning and  cooking strategies, keeping healthy foods in the home, and planning for success. ? ?Gina Washington has agreed to follow-up with our clinic in 3 weeks, fasting. She was informed of the importance of frequent follow-up visits to maximize her success with intensive lifestyle modifications for her multiple health conditions.  ? ?Objective:  ? ?Blood pressure 127/86, pulse 74, temperature 97.8 ?F (36.6 ?C), height '5\' 3"'$  (1.6 m), weight 146 lb (66.2 kg), SpO2 99 %. ?Body mass index is 25.86 kg/m?. ? ?General: Cooperative, alert, well developed, in no acute distress. ?HEENT: Conjunctivae and lids unremarkable. ?Cardiovascular: Regular rhythm.  ?Lungs: Normal work of breathing. ?Neurologic: No focal deficits.  ? ?Lab Results  ?Component Value Date  ? CREATININE 0.89 05/29/2021  ? BUN 10 05/29/2021  ? NA 141 05/29/2021  ? K 4.8 05/29/2021  ? CL 101 05/29/2021  ? CO2 24 05/29/2021  ? ?Lab Results  ?Component Value Date  ? ALT 18 05/29/2021  ? AST 14 05/29/2021  ? ALKPHOS 88 05/29/2021  ? BILITOT 0.3 05/29/2021  ? ?Lab Results  ?Component Value Date  ? HGBA1C 5.5 11/16/2020  ? ?Lab Results  ?Component Value Date  ? INSULIN 4.2 11/16/2020  ? ?Lab Results  ?Component Value Date  ? TSH 2.500 11/16/2020  ? ?Lab Results  ?Component Value Date  ? CHOL 211 (H) 05/29/2021  ? HDL 84 05/29/2021  ? LDLCALC 115 (H) 05/29/2021  ? TRIG 67 05/29/2021  ? CHOLHDL 2.2 11/16/2020  ? ?Lab Results  ?Component Value Date  ? VD25OH 74.3 05/29/2021  ? VD25OH 21.5 (L) 11/16/2020  ? ?Lab Results  ?  Component Value Date  ? WBC 5.3 05/29/2021  ? HGB 15.4 05/29/2021  ? HCT 46.4 05/29/2021  ? MCV 99 (H) 05/29/2021  ? PLT 251 05/29/2021  ? ?Lab Results  ?Component Value Date  ? IRON 233 (H) 11/16/2020  ? TIBC 344 11/16/2020  ? FERRITIN 449 (H) 11/16/2020  ? ?Attestation Statements:  ? ?Reviewed by clinician on day of visit: allergies, medications, problem list, medical history, surgical history, family history, social history, and previous encounter notes. ? ?I,  Water quality scientist, CMA, am acting as Location manager for Mina Marble, NP. ? ?I have reviewed the above documentation for accuracy and completeness, and I agree with the above. -  Remas Sobel d. Sasha Rueth, NP-C ?

## 2022-02-01 ENCOUNTER — Other Ambulatory Visit (HOSPITAL_BASED_OUTPATIENT_CLINIC_OR_DEPARTMENT_OTHER): Payer: Self-pay

## 2022-02-13 ENCOUNTER — Other Ambulatory Visit (HOSPITAL_COMMUNITY): Payer: Self-pay

## 2022-02-13 ENCOUNTER — Ambulatory Visit (INDEPENDENT_AMBULATORY_CARE_PROVIDER_SITE_OTHER): Payer: BC Managed Care – PPO | Admitting: Adult Health

## 2022-02-13 ENCOUNTER — Encounter (INDEPENDENT_AMBULATORY_CARE_PROVIDER_SITE_OTHER): Payer: Self-pay | Admitting: Adult Health

## 2022-02-13 VITALS — BP 126/74 | HR 85 | Temp 98.1°F | Ht 63.0 in | Wt 148.0 lb

## 2022-02-13 DIAGNOSIS — R632 Polyphagia: Secondary | ICD-10-CM

## 2022-02-13 DIAGNOSIS — E559 Vitamin D deficiency, unspecified: Secondary | ICD-10-CM | POA: Diagnosis not present

## 2022-02-13 DIAGNOSIS — Z9189 Other specified personal risk factors, not elsewhere classified: Secondary | ICD-10-CM

## 2022-02-13 DIAGNOSIS — Z6826 Body mass index (BMI) 26.0-26.9, adult: Secondary | ICD-10-CM | POA: Diagnosis not present

## 2022-02-13 DIAGNOSIS — E669 Obesity, unspecified: Secondary | ICD-10-CM | POA: Diagnosis not present

## 2022-02-13 DIAGNOSIS — W503XXS Accidental bite by another person, sequela: Secondary | ICD-10-CM

## 2022-02-13 MED ORDER — WEGOVY 1 MG/0.5ML ~~LOC~~ SOAJ
1.0000 mg | SUBCUTANEOUS | 0 refills | Status: DC
  Filled 2022-02-13: qty 2, 28d supply, fill #0

## 2022-02-14 ENCOUNTER — Encounter (INDEPENDENT_AMBULATORY_CARE_PROVIDER_SITE_OTHER): Payer: Self-pay | Admitting: Adult Health

## 2022-02-15 LAB — CBC WITH DIFFERENTIAL/PLATELET
Basophils Absolute: 0.1 10*3/uL (ref 0.0–0.2)
Basos: 1 %
EOS (ABSOLUTE): 0.1 10*3/uL (ref 0.0–0.4)
Eos: 1 %
Hematocrit: 41.9 % (ref 34.0–46.6)
Hemoglobin: 14.4 g/dL (ref 11.1–15.9)
Immature Grans (Abs): 0 10*3/uL (ref 0.0–0.1)
Immature Granulocytes: 1 %
Lymphocytes Absolute: 1.5 10*3/uL (ref 0.7–3.1)
Lymphs: 23 %
MCH: 34 pg — ABNORMAL HIGH (ref 26.6–33.0)
MCHC: 34.4 g/dL (ref 31.5–35.7)
MCV: 99 fL — ABNORMAL HIGH (ref 79–97)
Monocytes Absolute: 0.6 10*3/uL (ref 0.1–0.9)
Monocytes: 9 %
Neutrophils Absolute: 4.2 10*3/uL (ref 1.4–7.0)
Neutrophils: 65 %
Platelets: 358 10*3/uL (ref 150–450)
RBC: 4.24 x10E6/uL (ref 3.77–5.28)
RDW: 12.6 % (ref 11.7–15.4)
WBC: 6.4 10*3/uL (ref 3.4–10.8)

## 2022-02-15 LAB — VITAMIN D 25 HYDROXY (VIT D DEFICIENCY, FRACTURES): Vit D, 25-Hydroxy: 76 ng/mL (ref 30.0–100.0)

## 2022-02-15 LAB — HEMOGLOBIN A1C
Est. average glucose Bld gHb Est-mCnc: 105 mg/dL
Hgb A1c MFr Bld: 5.3 % (ref 4.8–5.6)

## 2022-02-15 LAB — INSULIN, RANDOM: INSULIN: 6.9 u[IU]/mL (ref 2.6–24.9)

## 2022-02-18 ENCOUNTER — Ambulatory Visit: Payer: BC Managed Care – PPO | Admitting: Psychiatry

## 2022-02-18 ENCOUNTER — Encounter: Payer: Self-pay | Admitting: Psychiatry

## 2022-02-18 DIAGNOSIS — F3342 Major depressive disorder, recurrent, in full remission: Secondary | ICD-10-CM | POA: Diagnosis not present

## 2022-02-18 DIAGNOSIS — F902 Attention-deficit hyperactivity disorder, combined type: Secondary | ICD-10-CM | POA: Diagnosis not present

## 2022-02-18 MED ORDER — LISDEXAMFETAMINE DIMESYLATE 50 MG PO CAPS: ORAL_CAPSULE | ORAL | 0 refills | Status: DC

## 2022-02-18 MED ORDER — LISDEXAMFETAMINE DIMESYLATE 50 MG PO CAPS: 50.0000 mg | ORAL_CAPSULE | Freq: Every day | ORAL | 0 refills | Status: DC

## 2022-02-18 MED ORDER — BUPROPION HCL ER (XL) 150 MG PO TB24: 150.0000 mg | ORAL_TABLET | Freq: Every day | ORAL | 1 refills | Status: DC
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
## 2022-02-21 ENCOUNTER — Ambulatory Visit: Payer: BC Managed Care – PPO | Admitting: Psychiatry
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
## 2022-02-25 DIAGNOSIS — W503XXA Accidental bite by another person, initial encounter: Secondary | ICD-10-CM | POA: Insufficient documentation

## 2022-02-28 ENCOUNTER — Ambulatory Visit: Payer: BC Managed Care – PPO | Admitting: Psychiatry

## 2022-03-07 ENCOUNTER — Encounter (INDEPENDENT_AMBULATORY_CARE_PROVIDER_SITE_OTHER): Payer: Self-pay | Admitting: Adult Health

## 2022-03-07 ENCOUNTER — Telehealth (INDEPENDENT_AMBULATORY_CARE_PROVIDER_SITE_OTHER): Payer: BC Managed Care – PPO | Admitting: Adult Health

## 2022-03-07 DIAGNOSIS — S61451S Open bite of right hand, sequela: Secondary | ICD-10-CM

## 2022-03-07 DIAGNOSIS — E669 Obesity, unspecified: Secondary | ICD-10-CM

## 2022-03-07 DIAGNOSIS — W503XXS Accidental bite by another person, sequela: Secondary | ICD-10-CM

## 2022-03-07 DIAGNOSIS — R632 Polyphagia: Secondary | ICD-10-CM

## 2022-03-07 DIAGNOSIS — Z6826 Body mass index (BMI) 26.0-26.9, adult: Secondary | ICD-10-CM

## 2022-03-07 MED ORDER — WEGOVY 1.7 MG/0.75ML ~~LOC~~ SOAJ: 1.7000 mg | SUBCUTANEOUS | 0 refills | Status: DC
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
## 2022-03-18 ENCOUNTER — Other Ambulatory Visit (HOSPITAL_BASED_OUTPATIENT_CLINIC_OR_DEPARTMENT_OTHER): Payer: Self-pay | Admitting: Internal Medicine

## 2022-03-18 DIAGNOSIS — Z1231 Encounter for screening mammogram for malignant neoplasm of breast: Secondary | ICD-10-CM

## 2022-04-02 ENCOUNTER — Encounter (HOSPITAL_BASED_OUTPATIENT_CLINIC_OR_DEPARTMENT_OTHER): Payer: Self-pay

## 2022-04-02 ENCOUNTER — Ambulatory Visit (HOSPITAL_BASED_OUTPATIENT_CLINIC_OR_DEPARTMENT_OTHER)
Admission: RE | Admit: 2022-04-02 | Discharge: 2022-04-02 | Disposition: A | Discharge status: Home / Self Care | Payer: BC Managed Care – PPO | Source: Ambulatory Visit | Attending: Internal Medicine | Admitting: Internal Medicine

## 2022-04-02 DIAGNOSIS — Z1231 Encounter for screening mammogram for malignant neoplasm of breast: Secondary | ICD-10-CM | POA: Insufficient documentation

## 2022-04-04 ENCOUNTER — Encounter (INDEPENDENT_AMBULATORY_CARE_PROVIDER_SITE_OTHER): Payer: Self-pay | Admitting: Adult Health

## 2022-04-04 ENCOUNTER — Other Ambulatory Visit (HOSPITAL_COMMUNITY): Payer: Self-pay

## 2022-04-04 ENCOUNTER — Ambulatory Visit (INDEPENDENT_AMBULATORY_CARE_PROVIDER_SITE_OTHER): Payer: BC Managed Care – PPO | Admitting: Adult Health

## 2022-04-04 VITALS — BP 124/85 | HR 89 | Temp 97.8°F | Ht 63.0 in | Wt 138.0 lb

## 2022-04-04 DIAGNOSIS — S6991XD Unspecified injury of right wrist, hand and finger(s), subsequent encounter: Secondary | ICD-10-CM

## 2022-04-04 DIAGNOSIS — E669 Obesity, unspecified: Secondary | ICD-10-CM | POA: Diagnosis not present

## 2022-04-04 DIAGNOSIS — R632 Polyphagia: Secondary | ICD-10-CM

## 2022-04-04 DIAGNOSIS — R6889 Other general symptoms and signs: Secondary | ICD-10-CM

## 2022-04-04 DIAGNOSIS — S6991XA Unspecified injury of right wrist, hand and finger(s), initial encounter: Secondary | ICD-10-CM

## 2022-04-04 DIAGNOSIS — Z9189 Other specified personal risk factors, not elsewhere classified: Secondary | ICD-10-CM

## 2022-04-04 DIAGNOSIS — Z6824 Body mass index (BMI) 24.0-24.9, adult: Secondary | ICD-10-CM | POA: Diagnosis not present

## 2022-04-04 MED ORDER — WEGOVY 1.7 MG/0.75ML ~~LOC~~ SOAJ
1.7000 mg | SUBCUTANEOUS | 1 refills | Status: DC
  Filled 2022-04-04: qty 3, 28d supply, fill #0

## 2022-04-04 MED ORDER — WEGOVY 1.7 MG/0.75ML ~~LOC~~ SOAJ
1.7000 mg | SUBCUTANEOUS | 0 refills | Status: DC
Start: 2022-04-04 — End: 2022-04-04

## 2022-04-08 NOTE — Progress Notes (Signed)
Chief Complaint:   OBESITY Gina Washington is here to discuss her progress with her obesity treatment plan along with follow-up of her obesity related diagnoses. Gina Washington is on the Category 1 Plan and states she is following her eating plan approximately 40% of the time. Gina Washington states she is walking for 20-30 minutes 2-3 times per week.  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   She has tolerated increase Wegovy 1.7 mg well-2 doses at this strength. She denies mass in neck, dysphagia, dyspepsia, persistent hoarseness, abd pain, or N/V/Constipation.  Review bioimpedance with the patient, consider MX phase at next office visit.  Subjective:   1. Injury of right hand, initial encounter 01/2022 sustained bite wound to dorsum of R hand. Treated at [place], then ED for poor healing. 02/01/22 Hospital Admission for Cellulitis of R hand- surgical intervention and IV ABX therapy.  2. At risk for activity intolerance Gina Washington is at risk of exercise intolerance due to right hand injury.  Assessment/Plan:   1. Injury of right hand, initial encounter F/u with Ortho and PT as directed.  2. At risk for activity intolerance Gina Washington was given approximately 15 minutes of exercise intolerance counseling today. She is 56 y.o. female and has risk factors exercise intolerance including obesity. We discussed intensive lifestyle modifications today with an emphasis on specific weight loss instructions and strategies. Gina Washington will slowly increase activity as tolerated.  Repetitive spaced learning was employed today to elicit superior memory formation and behavioral change.  3. Obesity, current BMI 24.5 Gina Washington is currently in the action  stage of change. As such, her goal is to continue with weight loss efforts. She has agreed to the Category 1 Plan.   We will recheck IC at her next office visit.  We discussed various medication options to help Gina Washington with her weight loss efforts and we both agreed to continue Wegovy 1.7 mg once every 10 days, and we will refill for 2 months.  - Semaglutide-Weight Management (WEGOVY) 1.7 MG/0.75ML SOAJ; Inject 1.7 mg into the skin once a week.  Dispense: 3 mL; Refill: 1  Exercise goals: As is.   Behavioral modification strategies: increasing lean protein intake, decreasing simple carbohydrates, meal planning and cooking strategies, keeping healthy foods in the home, and planning for success.  Gina Washington has agreed to follow-up with our clinic in 6 to 8 weeks. She was informed of the importance of frequent follow-up visits to maximize her success with intensive lifestyle modifications for her multiple health conditions.   Objective:   Blood pressure 124/85, pulse 89, temperature 97.8 F (36.6 C), height '5\' 3"'$  (1.6 m), weight 138 lb (62.6 kg), SpO2 100 %. Body mass index is 24.45 kg/m.  General: Cooperative, alert, well developed, in no acute distress. HEENT: Conjunctivae and lids unremarkable. Cardiovascular: Regular rhythm.  Lungs: Normal work of breathing. Neurologic: No focal deficits.   Lab Results  Component Value Date   CREATININE 0.89 05/29/2021   BUN 10 05/29/2021   NA 141 05/29/2021   K 4.8 05/29/2021   CL 101 05/29/2021   CO2 24 05/29/2021   Lab Results  Component Value Date   ALT 18 05/29/2021   AST 14 05/29/2021   ALKPHOS 88 05/29/2021  BILITOT 0.3 05/29/2021   Lab Results  Component Value Date   HGBA1C 5.3 02/13/2022   HGBA1C 5.5 11/16/2020   Lab Results  Component Value Date   INSULIN 6.9 02/13/2022   INSULIN 4.2 11/16/2020   Lab Results  Component Value Date   TSH 2.500 11/16/2020   Lab Results  Component Value Date   CHOL 211 (H) 05/29/2021    HDL 84 05/29/2021   LDLCALC 115 (H) 05/29/2021   TRIG 67 05/29/2021   CHOLHDL 2.2 11/16/2020   Lab Results  Component Value Date   VD25OH 76.0 02/13/2022   VD25OH 74.3 05/29/2021   VD25OH 21.5 (L) 11/16/2020   Lab Results  Component Value Date   WBC 6.4 02/13/2022   HGB 14.4 02/13/2022   HCT 41.9 02/13/2022   MCV 99 (H) 02/13/2022   PLT 358 02/13/2022   Lab Results  Component Value Date   IRON 233 (H) 11/16/2020   TIBC 344 11/16/2020   FERRITIN 449 (H) 11/16/2020   Attestation Statements:   Reviewed by clinician on day of visit: allergies, medications, problem list, medical history, surgical history, family history, social history, and previous encounter notes.   Wilhemena Durie, am acting as transcriptionist for Mina Marble, NP.  I have reviewed the above documentation for accuracy and completeness, and I agree with the above. -  Ruxin Ransome d. Reighn Kaplan, NP-C

## 2022-04-24 ENCOUNTER — Encounter (INDEPENDENT_AMBULATORY_CARE_PROVIDER_SITE_OTHER): Payer: Self-pay | Admitting: Adult Health

## 2022-05-01 IMAGING — MG MM DIGITAL SCREENING BILAT W/ TOMO AND CAD
8 series · 8 of 24 positions shown · non-contrast
Comparison: Previous exam(s).

ACR Breast Density Category a: The breast tissue is almost entirely
fatty.

CLINICAL DATA: Screening.

EXAM:
DIGITAL SCREENING BILATERAL MAMMOGRAM WITH TOMOSYNTHESIS AND CAD
TECHNIQUE: Bilateral screening digital craniocaudal and mediolateral oblique
mammograms were obtained. Bilateral screening digital breast
tomosynthesis was performed. The images were evaluated with
computer-aided detection.

[R CC synth-2D]
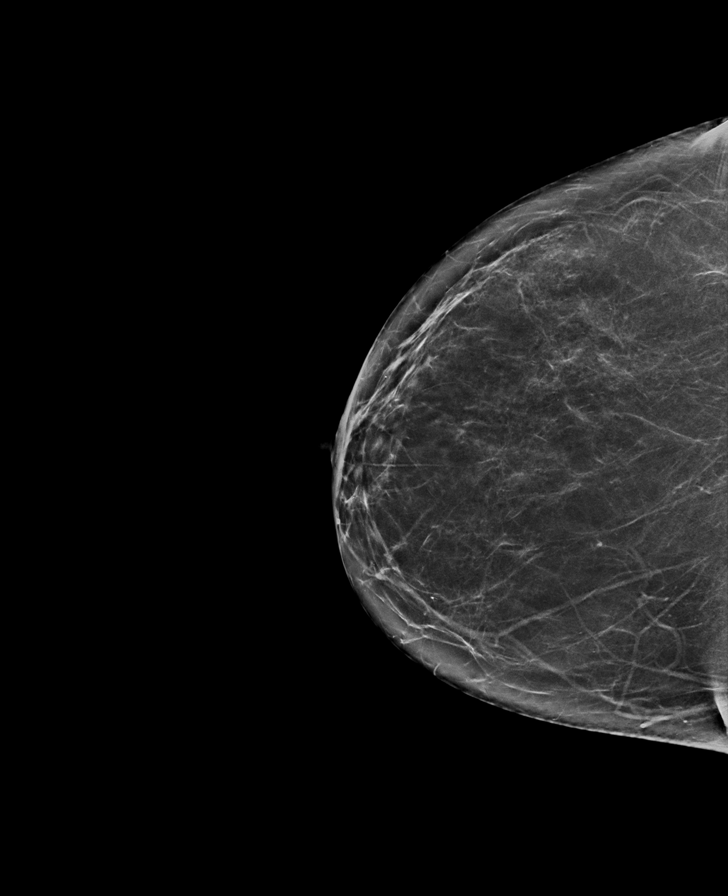

[R MLO synth-2D]
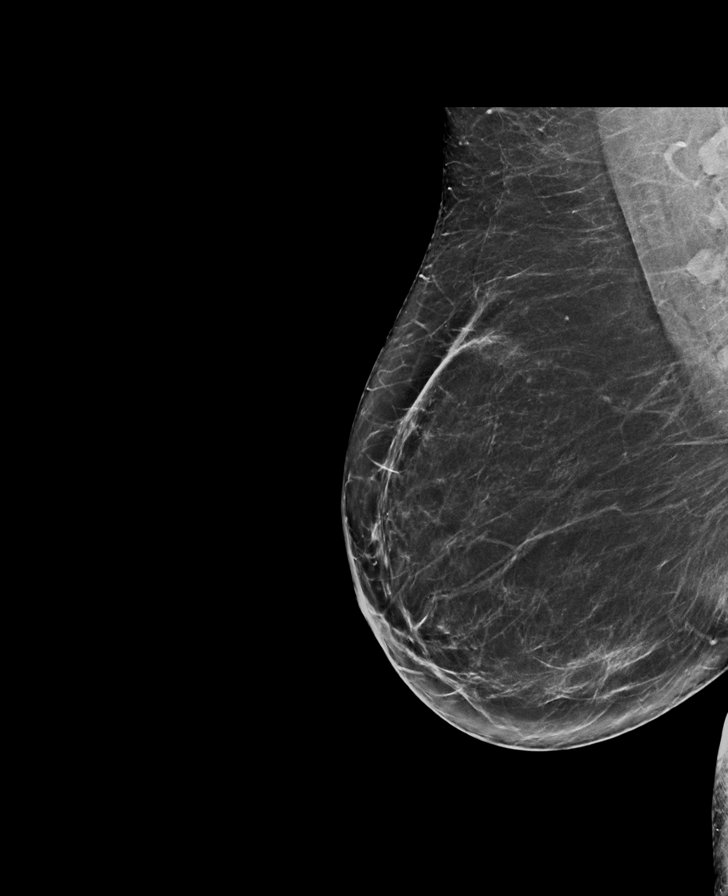

[L CC synth-2D]
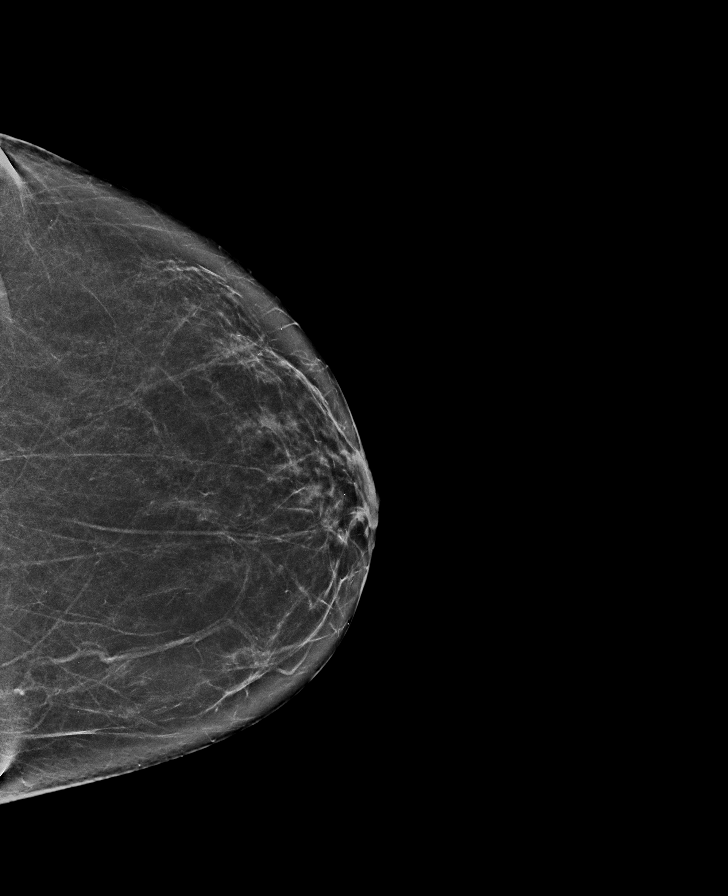

[L MLO synth-2D]
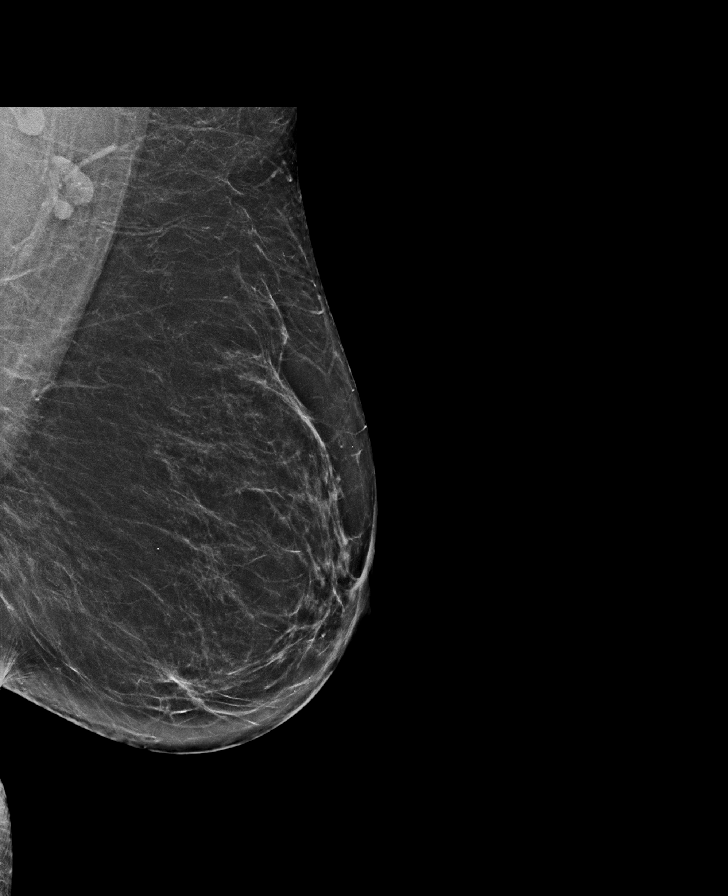

[R CC tomo · tomo slice 33/65.0]
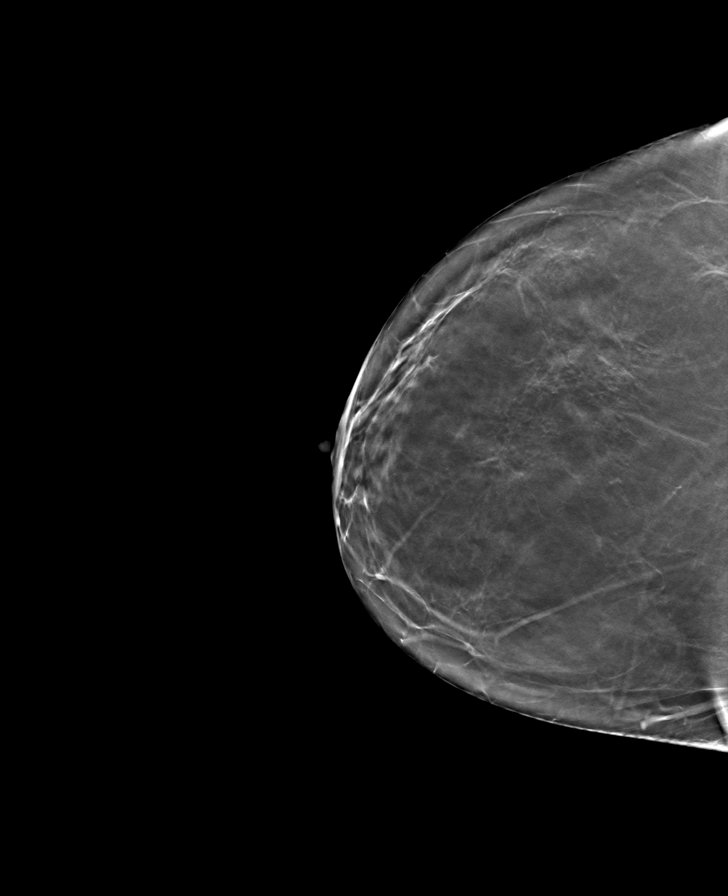

[L CC tomo · tomo slice 33/66.0]
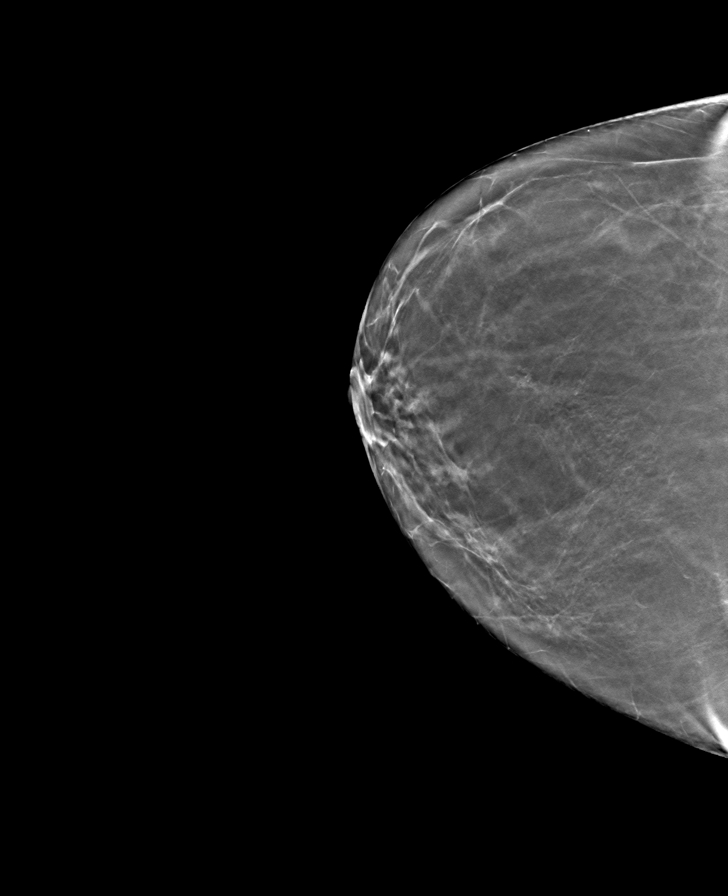

[R MLO tomo · tomo slice 37/74.0]
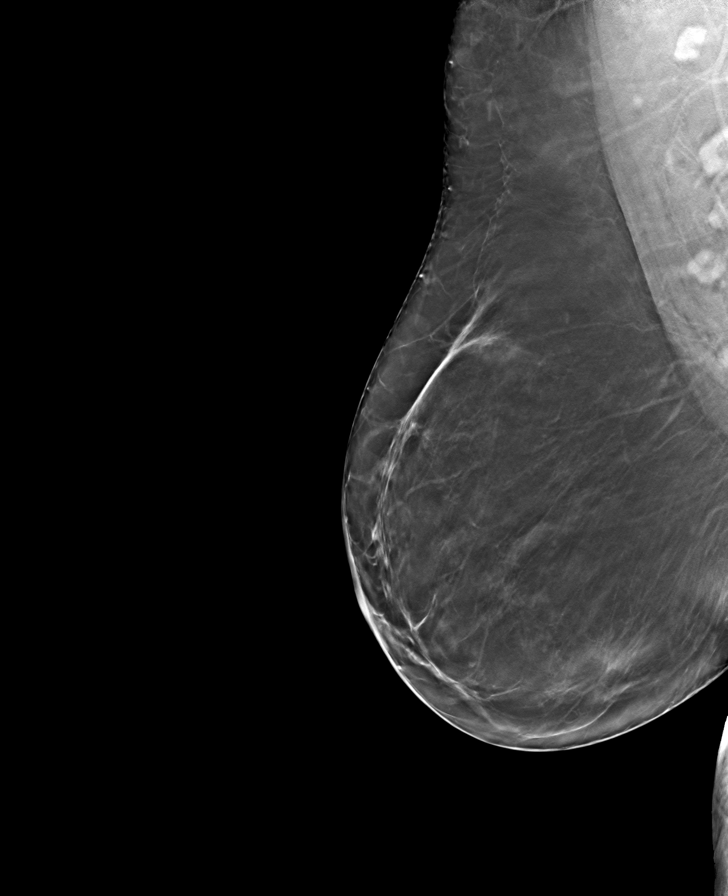

[L MLO tomo · tomo slice 37/72.0]
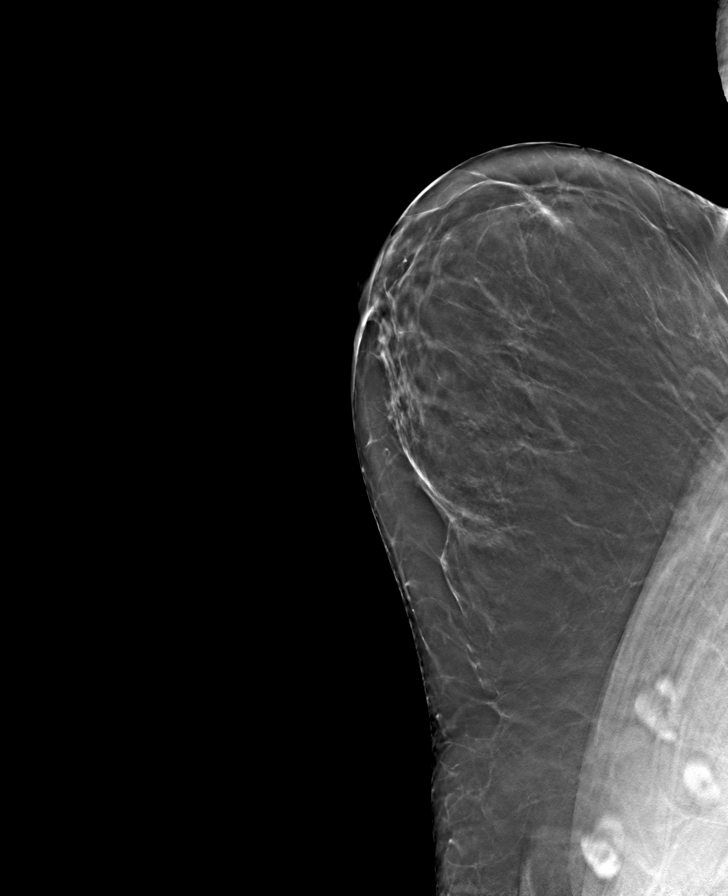

[8 of 24 positions shown; findings below may reference images not displayed]

FINDINGS: There are no findings suspicious for malignancy. The images were
evaluated with computer-aided detection.
IMPRESSION: No mammographic evidence of malignancy. A result letter of this
screening mammogram will be mailed directly to the patient.

RECOMMENDATION:
Screening mammogram in one year. (Code:JP-J-DD5)

BI-RADS CATEGORY  1: Negative.

## 2022-05-16 ENCOUNTER — Encounter: Payer: Self-pay | Admitting: General Practice

## 2022-05-22 ENCOUNTER — Ambulatory Visit (INDEPENDENT_AMBULATORY_CARE_PROVIDER_SITE_OTHER): Payer: BC Managed Care – PPO | Admitting: Adult Health

## 2022-05-22 ENCOUNTER — Encounter (INDEPENDENT_AMBULATORY_CARE_PROVIDER_SITE_OTHER): Payer: Self-pay

## 2022-05-22 ENCOUNTER — Encounter (INDEPENDENT_AMBULATORY_CARE_PROVIDER_SITE_OTHER): Payer: Self-pay | Admitting: Adult Health

## 2022-05-22 VITALS — BP 122/90 | HR 77 | Temp 97.7°F | Ht 63.0 in | Wt 134.0 lb

## 2022-05-22 DIAGNOSIS — R7301 Impaired fasting glucose: Secondary | ICD-10-CM | POA: Diagnosis not present

## 2022-05-22 DIAGNOSIS — Z9189 Other specified personal risk factors, not elsewhere classified: Secondary | ICD-10-CM

## 2022-05-22 DIAGNOSIS — E669 Obesity, unspecified: Secondary | ICD-10-CM

## 2022-05-22 DIAGNOSIS — Z6823 Body mass index (BMI) 23.0-23.9, adult: Secondary | ICD-10-CM

## 2022-05-22 DIAGNOSIS — E538 Deficiency of other specified B group vitamins: Secondary | ICD-10-CM | POA: Diagnosis not present

## 2022-05-22 DIAGNOSIS — E559 Vitamin D deficiency, unspecified: Secondary | ICD-10-CM | POA: Diagnosis not present

## 2022-05-22 DIAGNOSIS — R0602 Shortness of breath: Secondary | ICD-10-CM | POA: Diagnosis not present

## 2022-05-22 MED ORDER — WEGOVY 1 MG/0.5ML ~~LOC~~ SOAJ: 1.0000 mg | SUBCUTANEOUS | 0 refills | Status: DC

## 2022-05-22 MED ORDER — LISDEXAMFETAMINE DIMESYLATE 50 MG PO CAPS
50.0000 mg | ORAL_CAPSULE | Freq: Every day | ORAL | 0 refills | Status: DC
Start: 2022-07-22 — End: 2022-08-22

## 2022-05-22 MED ORDER — LISDEXAMFETAMINE DIMESYLATE 50 MG PO CAPS: 50.0000 mg | ORAL_CAPSULE | Freq: Every day | ORAL | 0 refills | Status: DC

## 2022-05-23 ENCOUNTER — Encounter (INDEPENDENT_AMBULATORY_CARE_PROVIDER_SITE_OTHER): Payer: Self-pay | Admitting: Adult Health

## 2022-05-23 LAB — COMPREHENSIVE METABOLIC PANEL
ALT: 27 IU/L (ref 0–32)
AST: 43 IU/L — ABNORMAL HIGH (ref 0–40)
Albumin/Globulin Ratio: 1.7 (ref 1.2–2.2)
Albumin: 4.6 g/dL (ref 3.8–4.9)
Alkaline Phosphatase: 97 IU/L (ref 44–121)
BUN/Creatinine Ratio: 19 (ref 9–23)
BUN: 17 mg/dL (ref 6–24)
Bilirubin Total: 0.5 mg/dL (ref 0.0–1.2)
CO2: 24 mmol/L (ref 20–29)
Calcium: 10.3 mg/dL — ABNORMAL HIGH (ref 8.7–10.2)
Chloride: 97 mmol/L (ref 96–106)
Creatinine, Ser: 0.89 mg/dL (ref 0.57–1.00)
Globulin, Total: 2.7 g/dL (ref 1.5–4.5)
Glucose: 89 mg/dL (ref 70–99)
Potassium: 4.8 mmol/L (ref 3.5–5.2)
Sodium: 139 mmol/L (ref 134–144)
Total Protein: 7.3 g/dL (ref 6.0–8.5)
eGFR: 76 mL/min/{1.73_m2} (ref >59)

## 2022-05-23 LAB — VITAMIN B12: Vitamin B-12: 377 pg/mL (ref 232–1245)

## 2022-05-23 LAB — VITAMIN D 25 HYDROXY (VIT D DEFICIENCY, FRACTURES): Vit D, 25-Hydroxy: 126 ng/mL — ABNORMAL HIGH (ref 30.0–100.0)

## 2022-05-23 LAB — HEMOGLOBIN A1C
Est. average glucose Bld gHb Est-mCnc: 103 mg/dL
Hgb A1c MFr Bld: 5.2 % (ref 4.8–5.6)

## 2022-05-23 LAB — INSULIN, RANDOM: INSULIN: 2.1 u[IU]/mL — ABNORMAL LOW (ref 2.6–24.9)

## 2022-05-28 DIAGNOSIS — R7301 Impaired fasting glucose: Secondary | ICD-10-CM | POA: Insufficient documentation

## 2022-05-28 DIAGNOSIS — R0602 Shortness of breath: Secondary | ICD-10-CM | POA: Insufficient documentation

## 2022-05-28 NOTE — Progress Notes (Signed)
Chief Complaint:   OBESITY Gina Washington is here to discuss her progress with her obesity treatment plan along with follow-up of her obesity related diagnoses. Gina Washington is on the Category 1 Plan and states she is following her eating plan approximately 60% of the time. Gina Washington states she is walking 20 minutes 2-3 times per week.  Today's visit was #: 21 Starting weight: 182 lbs Starting date: 11/16/2020 Today's weight: 134 lbs Today's date: 05/22/2022 Total lbs lost to date: 48 lbs Total lbs lost since last in-office visit: 4  Interim History:  Gina Washington has lost 48 lbs!  Bioimpedance results reviewed with pt: Muscle mass (MM) 88 lbs, Adipose mass (AM) 40 lbs MM>AM 88 lbs > 40 lbs  Visceral adipose rating: 6- at goal!   Current Weight 134 with corresponding BMI 23.8.  Subjective:   1. SOB (shortness of breath) on exertion Gina Washington endorses dyspnea with excertion, denies chest pain with exertion. 11/16/20 RMR 1899 RMR today 1469 Metabolism decreased by 430 calories per day.  2. B12 deficiency Gina Washington is currently on over the counter B Complex with Vit C daily.  3. Vitamin D deficiency Gina Washington is on over the counter Vit D3, 2,000 IU daily.  4. Elevated fasting glucose Gina Washington is currently on Wegovy 1.7 mg --will take 2 more doses at this strength then decrease Wegovy to 1 mg and hold at this dose until next office visit in 8 weeks. She denies medication SE with [facility] therapy.  5. At risk for constipation Gina Washington is at increased risk for constipation due to inadequate water intake, changes in diet, and/or use of medications such as GLP1 agonists. Gina Washington denies hard, infrequent stools currently.   Assessment/Plan:   1. SOB (shortness of breath) on exertion Check IC to maintain weight, Cat 2 with 100 additional snack calories.  2. B12 deficiency We will obtain labs today.  - Vitamin B12  3. Vitamin D deficiency We will obtain labs today.  - VITAMIN D 25 Hydroxy  (Vit-D Deficiency, Fractures)  4. Elevated fasting glucose We will obtain labs today.  - Comprehensive metabolic panel - Hemoglobin A1c - Insulin, random  5. At risk for constipation Gina Washington was given approximately 15 minutes of counseling today regarding prevention of constipation. She was encouraged to increase water and fiber intake.   6. Obesity, current BMI 23.8 We will refill Wegovy 1 mg SubQ once weekly for 1 month with 0 refills.  -Refill Semaglutide-Weight Management (WEGOVY) 1 MG/0.5ML SOAJ; Inject 1 mg into the skin once a week.  Dispense: 6 mL; Refill: 0  Gina Washington is currently in the action stage of change. As such, her goal is to continue with weight loss efforts. She has agreed to the Category 2 Plan +100.  Exercise goals: As is.  Behavioral modification strategies: meal planning and cooking strategies, keeping healthy foods in the home, and planning for success.  Gina Washington has agreed to follow-up with our clinic in 8 weeks. She was informed of the importance of frequent follow-up visits to maximize her success with intensive lifestyle modifications for her multiple health conditions.   Gina Washington was informed we would discuss her lab results at her next visit unless there is a critical issue that needs to be addressed sooner. Gina Washington agreed to keep her next visit at the agreed upon time to discuss these results.  Objective:   Blood pressure (!) 122/90, pulse 77, temperature 97.7 F (36.5 C), height '5\' 3"'$  (1.6 m), weight 134 lb (60.8 kg), SpO2 100 %. Body  mass index is 23.74 kg/m.  General: Cooperative, alert, well developed, in no acute distress. HEENT: Conjunctivae and lids unremarkable. Cardiovascular: Regular rhythm.  Lungs: Normal work of breathing. Neurologic: No focal deficits.   Lab Results  Component Value Date   CREATININE 0.89 05/22/2022   BUN 17 05/22/2022   NA 139 05/22/2022   K 4.8 05/22/2022   CL 97 05/22/2022   CO2 24 05/22/2022   Lab Results   Component Value Date   ALT 27 05/22/2022   AST 43 (H) 05/22/2022   ALKPHOS 97 05/22/2022   BILITOT 0.5 05/22/2022   Lab Results  Component Value Date   HGBA1C 5.2 05/22/2022   HGBA1C 5.3 02/13/2022   HGBA1C 5.5 11/16/2020   Lab Results  Component Value Date   INSULIN 2.1 (L) 05/22/2022   INSULIN 6.9 02/13/2022   INSULIN 4.2 11/16/2020   Lab Results  Component Value Date   TSH 2.500 11/16/2020   Lab Results  Component Value Date   CHOL 211 (H) 05/29/2021   HDL 84 05/29/2021   LDLCALC 115 (H) 05/29/2021   TRIG 67 05/29/2021   CHOLHDL 2.2 11/16/2020   Lab Results  Component Value Date   VD25OH 126.0 (H) 05/22/2022   VD25OH 76.0 02/13/2022   VD25OH 74.3 05/29/2021   Lab Results  Component Value Date   WBC 6.4 02/13/2022   HGB 14.4 02/13/2022   HCT 41.9 02/13/2022   MCV 99 (H) 02/13/2022   PLT 358 02/13/2022   Lab Results  Component Value Date   IRON 233 (H) 11/16/2020   TIBC 344 11/16/2020   FERRITIN 449 (H) 11/16/2020   Attestation Statements:   Reviewed by clinician on day of visit: allergies, medications, problem list, medical history, surgical history, family history, social history, and previous encounter notes.  I, Brendell Tyus, RMA, am acting as transcriptionist for Mina Marble, NP.  I have reviewed the above documentation for accuracy and completeness, and I agree with the above. -  Rashid Whitenight d. Gina Ahlquist, NP-C

## 2022-06-19 ENCOUNTER — Encounter: Payer: BC Managed Care – PPO | Admitting: Occupational Therapy

## 2022-06-19 ENCOUNTER — Ambulatory Visit: Payer: BC Managed Care – PPO | Admitting: Physical Therapy

## 2022-07-18 ENCOUNTER — Encounter (INDEPENDENT_AMBULATORY_CARE_PROVIDER_SITE_OTHER): Payer: Self-pay | Admitting: Adult Health

## 2022-07-18 ENCOUNTER — Ambulatory Visit (INDEPENDENT_AMBULATORY_CARE_PROVIDER_SITE_OTHER): Payer: BC Managed Care – PPO | Admitting: Adult Health

## 2022-07-18 VITALS — BP 126/86 | HR 74 | Temp 97.9°F | Ht 63.0 in | Wt 138.0 lb

## 2022-07-18 DIAGNOSIS — Z6824 Body mass index (BMI) 24.0-24.9, adult: Secondary | ICD-10-CM | POA: Diagnosis not present

## 2022-07-18 DIAGNOSIS — E669 Obesity, unspecified: Secondary | ICD-10-CM | POA: Diagnosis not present

## 2022-07-18 DIAGNOSIS — R110 Nausea: Secondary | ICD-10-CM

## 2022-07-18 MED ORDER — WEGOVY 1 MG/0.5ML ~~LOC~~ SOAJ: 1.0000 mg | SUBCUTANEOUS | 0 refills | Status: DC

## 2022-07-18 MED ORDER — ONDANSETRON HCL 4 MG PO TABS: 4.0000 mg | ORAL_TABLET | Freq: Three times a day (TID) | ORAL | 0 refills | Status: DC | PRN

## 2022-07-25 ENCOUNTER — Telehealth: Payer: Self-pay | Admitting: Physical Therapy

## 2022-07-25 DIAGNOSIS — M6281 Muscle weakness (generalized): Secondary | ICD-10-CM | POA: Insufficient documentation

## 2022-07-25 DIAGNOSIS — M25611 Stiffness of right shoulder, not elsewhere classified: Secondary | ICD-10-CM

## 2022-07-25 NOTE — Telephone Encounter (Signed)
Orders Placed This Encounter  Procedures   Ambulatory referral to Physical Therapy   Ambulatory referral to Occupational Therapy    

## 2022-07-25 NOTE — Telephone Encounter (Signed)
Dr. Krista Blue,  Wende Crease is scheduled for OT and PT re-evaluations on 07/31/22 as recommended when pt was last discharged from therapy due to progressive nature of diagnosis.  Pt was in agreement with this plan.  If you are in agreement, please send updated order for OT and PT via epic.  Thank you, Janann August, PT, DPT 07/25/22 11:26 AM

## 2022-07-29 NOTE — Progress Notes (Signed)
Chief Complaint:   OBESITY Gina Washington is here to discuss her progress with her obesity treatment plan along with follow-up of her obesity related diagnoses. Gina Washington is on the Category 2 Plan and states she is following her eating plan approximately 50% of the time. Gina Washington states she is walking 20-30 minutes 2-3 times per week.  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   The last 3 Wegovy 1 mg injections: every 14 days, every 14 days, then every 21 days since the last OV. Since spacing out GLP-1 therapy, she has increased carbohydrate cravings (pasta/fruit), but no increase in portion size.   Subjective:   1. Nausea Reports nausea without vomiting 24 hours after Wegovy injection.   She denies abdominal pain or hematochezia.   Assessment/Plan:   1. Nausea Start - ondansetron (ZOFRAN) 4 MG tablet; Take 1 tablet (4 mg total) by mouth every 8 (eight) hours as needed for nausea or vomiting.  Dispense: 20 tablet; Refill: 0  2. Obesity, current BMI 24.4 Refill - Semaglutide-Weight Management (WEGOVY) 1 MG/0.5ML SOAJ; Inject 1 mg into the skin once a week.  Inject every 7-14 days. Dispense: 6 mL; Refill: 0  Gina Washington is currently in the action stage of change. As such, her goal is to continue with weight loss efforts. She has agreed to the Category 2 Plan +100 calories.   Exercise goals:  As is.   Behavioral modification strategies: increasing lean protein intake, decreasing simple carbohydrates, meal planning and cooking strategies, keeping healthy foods in the home, and planning for success.  Gina Washington has agreed to follow-up with our clinic in 6 weeks. She was informed of the importance of frequent follow-up visits to  maximize her success with intensive lifestyle modifications for her multiple health conditions.   Objective:   Blood pressure 126/86, pulse 74, temperature 97.9 F (36.6 C), height 5\' 3"  (1.6 m), weight 138 lb (62.6 kg), SpO2 99 %. Body mass index is 24.45 kg/m.  General: Cooperative, alert, well developed, in no acute distress. HEENT: Conjunctivae and lids unremarkable. Cardiovascular: Regular rhythm.  Lungs: Normal work of breathing. Neurologic: No focal deficits.   Lab Results  Component Value Date   CREATININE 0.89 05/22/2022   BUN 17 05/22/2022   NA 139 05/22/2022   K 4.8 05/22/2022   CL 97 05/22/2022   CO2 24 05/22/2022   Lab Results  Component Value Date   ALT 27 05/22/2022   AST 43 (H) 05/22/2022   ALKPHOS 97 05/22/2022   BILITOT 0.5 05/22/2022   Lab Results  Component Value Date   HGBA1C 5.2 05/22/2022   HGBA1C 5.3 02/13/2022   HGBA1C 5.5 11/16/2020   Lab Results  Component Value Date   INSULIN 2.1 (L) 05/22/2022   INSULIN 6.9 02/13/2022   INSULIN 4.2 11/16/2020   Lab Results  Component Value Date   TSH 2.500 11/16/2020   Lab Results  Component Value Date   CHOL 211 (H) 05/29/2021   HDL 84 05/29/2021   LDLCALC 115 (H) 05/29/2021   TRIG 67 05/29/2021   CHOLHDL 2.2 11/16/2020   Lab Results  Component Value Date   VD25OH 126.0 (H) 05/22/2022   VD25OH 76.0 02/13/2022   VD25OH 74.3 05/29/2021  Lab Results  Component Value Date   WBC 6.4 02/13/2022   HGB 14.4 02/13/2022   HCT 41.9 02/13/2022   MCV 99 (H) 02/13/2022   PLT 358 02/13/2022   Lab Results  Component Value Date   IRON 233 (H) 11/16/2020   TIBC 344 11/16/2020   FERRITIN 449 (H) 11/16/2020   Attestation Statements:   Reviewed by clinician on day of visit: allergies, medications, problem list, medical history, surgical history, family history, social history, and previous encounter notes.  I, Malcolm Metro, RMA, am acting as Energy manager for William Hamburger, NP.  I have  reviewed the above documentation for accuracy and completeness, and I agree with the above. -  Ariyannah Pauling d. Hannah Strader, NP-C

## 2022-07-31 ENCOUNTER — Ambulatory Visit (INDEPENDENT_AMBULATORY_CARE_PROVIDER_SITE_OTHER): Payer: BC Managed Care – PPO | Admitting: Obstetrics and Gynecology

## 2022-07-31 ENCOUNTER — Encounter: Payer: Self-pay | Admitting: Obstetrics and Gynecology

## 2022-07-31 ENCOUNTER — Ambulatory Visit: Payer: BC Managed Care – PPO | Attending: Neurology | Admitting: Occupational Therapy

## 2022-07-31 ENCOUNTER — Ambulatory Visit: Payer: BC Managed Care – PPO | Admitting: Physical Therapy

## 2022-07-31 VITALS — BP 128/83 | HR 67 | Ht 63.0 in | Wt 138.0 lb

## 2022-07-31 DIAGNOSIS — R293 Abnormal posture: Secondary | ICD-10-CM | POA: Diagnosis present

## 2022-07-31 DIAGNOSIS — M25621 Stiffness of right elbow, not elsewhere classified: Secondary | ICD-10-CM | POA: Insufficient documentation

## 2022-07-31 DIAGNOSIS — Z01419 Encounter for gynecological examination (general) (routine) without abnormal findings: Secondary | ICD-10-CM

## 2022-07-31 DIAGNOSIS — R2681 Unsteadiness on feet: Secondary | ICD-10-CM | POA: Insufficient documentation

## 2022-07-31 DIAGNOSIS — M6281 Muscle weakness (generalized): Secondary | ICD-10-CM | POA: Insufficient documentation

## 2022-07-31 DIAGNOSIS — R2689 Other abnormalities of gait and mobility: Secondary | ICD-10-CM | POA: Diagnosis present

## 2022-07-31 DIAGNOSIS — M25641 Stiffness of right hand, not elsewhere classified: Secondary | ICD-10-CM | POA: Diagnosis present

## 2022-07-31 DIAGNOSIS — R278 Other lack of coordination: Secondary | ICD-10-CM | POA: Insufficient documentation

## 2022-07-31 DIAGNOSIS — M25611 Stiffness of right shoulder, not elsewhere classified: Secondary | ICD-10-CM | POA: Insufficient documentation

## 2022-07-31 NOTE — Progress Notes (Signed)
   ANNUAL EXAM Patient name: Gina Washington MRN [id number]  Date of birth: 08-20-1966 Chief Complaint:   Gynecologic Exam Annual exam  History of Present Illness:   Gina Washington is a 56 y.o. G65P1000 female being seen today for a routine annual exam.  Current complaints: none  Overall doing well. Rare hot flushes, not bothersome. Currently sexually active, occasional dryness noted. No vaginal bleeding since IUD removal. No breast complaints.   No LMP recorded. Patient is postmenopausal.   The pregnancy intention screening data noted above was reviewed. Potential methods of contraception were discussed. The patient elected to proceed with No data recorded.   Last pap 12/2020. Results were: NILM w/ HRHPV negative. H/O abnormal pap: yes ASCUS, HRHPV neg Last mammogram: 03/2022. Results were: normal.  Last colonoscopy: not done.      11/16/2020    9:03 AM  Depression screen PHQ 2/9  Decreased Interest 3  Down, Depressed, Hopeless 1  PHQ - 2 Score 4  Altered sleeping 2  Tired, decreased energy 3  Change in appetite 2  Feeling bad or failure about yourself  1  Trouble concentrating 2  Moving slowly or fidgety/restless 0  Suicidal thoughts 0  PHQ-9 Score 14  Difficult doing work/chores Not difficult at all         No data to display           Review of Systems:   Pertinent items are noted in HPI Denies any headaches, blurred vision, fatigue, shortness of breath, chest pain, abdominal pain, abnormal vaginal discharge/itching/odor/irritation, problems with periods, bowel movements, urination, or intercourse unless otherwise stated above. Pertinent History Reviewed:  Reviewed past medical,surgical, social and family history.  Reviewed problem list, medications and allergies. Physical Assessment:   Vitals:   07/31/22 1310  BP: 128/83  Pulse: 67  Weight: 138 lb (62.6 kg)  Height: '5\' 3"'$  (1.6 m)  Body mass index is 24.45 kg/m.        Physical Examination:   General appearance  - well appearing, and in no distress  Mental status - alert, oriented to person, place, and time  Psych:  She has a normal mood and affect  Skin - warm and dry, normal color, no suspicious lesions noted  Chest - effort normal, all lung fields clear to auscultation bilaterally  Heart - normal rate and regular rhythm  Neck:  midline trachea, no thyromegaly or nodules  Breasts - breasts appear normal, no suspicious masses, no skin or nipple changes or  axillary nodes  Abdomen - soft, nontender, nondistended, no masses or organomegaly  Pelvic - deferred  Extremities:  No swelling or varicosities noted  Chaperone present for exam  No results found for this or any previous visit (from the past 24 hour(s)).  Assessment & Plan:  1) Well-Woman Exam Pap smear not indicated today, pelvic exam declined GI referral for colonoscopy  Discussed use of lubricant for intercourse and vaginal moisturizer for every day use as needed  Postmenopausal state as she has been amenorrheic and with elevated FSH and vasomotor symptoms Vasomotor symptoms not bothersome at this time  Orders Placed This Encounter  Procedures   Ambulatory referral to Gastroenterology    Follow-up: No follow-ups on file.  Darliss Cheney, MD 07/31/2022 2:40 PM
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
## 2022-08-22 ENCOUNTER — Ambulatory Visit: Payer: BC Managed Care – PPO | Admitting: Psychiatry

## 2022-08-22 ENCOUNTER — Encounter: Payer: Self-pay | Admitting: Psychiatry

## 2022-08-22 VITALS — BP 146/87 | HR 72

## 2022-08-22 DIAGNOSIS — F411 Generalized anxiety disorder: Secondary | ICD-10-CM

## 2022-08-22 DIAGNOSIS — F3342 Major depressive disorder, recurrent, in full remission: Secondary | ICD-10-CM | POA: Diagnosis not present

## 2022-08-22 DIAGNOSIS — F902 Attention-deficit hyperactivity disorder, combined type: Secondary | ICD-10-CM | POA: Diagnosis not present

## 2022-08-22 MED ORDER — LISDEXAMFETAMINE DIMESYLATE 50 MG PO CAPS: 50.0000 mg | ORAL_CAPSULE | Freq: Every day | ORAL | 0 refills | Status: DC

## 2022-08-22 MED ORDER — BUPROPION HCL ER (XL) 150 MG PO TB24: 150.0000 mg | ORAL_TABLET | Freq: Every day | ORAL | 1 refills | Status: DC

## 2022-08-22 MED ORDER — LISDEXAMFETAMINE DIMESYLATE 50 MG PO CAPS: ORAL_CAPSULE | ORAL | 0 refills | Status: DC
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
## 2022-08-29 ENCOUNTER — Ambulatory Visit: Payer: BC Managed Care – PPO | Attending: Neurology | Admitting: Physical Therapy

## 2022-08-29 DIAGNOSIS — R2681 Unsteadiness on feet: Secondary | ICD-10-CM | POA: Diagnosis present

## 2022-08-29 DIAGNOSIS — M6281 Muscle weakness (generalized): Secondary | ICD-10-CM | POA: Insufficient documentation

## 2022-08-29 DIAGNOSIS — R293 Abnormal posture: Secondary | ICD-10-CM | POA: Diagnosis present

## 2022-08-29 DIAGNOSIS — R2689 Other abnormalities of gait and mobility: Secondary | ICD-10-CM | POA: Insufficient documentation
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
## 2022-09-12 ENCOUNTER — Ambulatory Visit: Payer: BC Managed Care – PPO | Admitting: Occupational Therapy

## 2022-09-17 ENCOUNTER — Other Ambulatory Visit (HOSPITAL_COMMUNITY): Payer: Self-pay

## 2022-09-17 ENCOUNTER — Ambulatory Visit (INDEPENDENT_AMBULATORY_CARE_PROVIDER_SITE_OTHER): Payer: BC Managed Care – PPO | Admitting: Physician Assistant

## 2022-09-17 ENCOUNTER — Encounter (INDEPENDENT_AMBULATORY_CARE_PROVIDER_SITE_OTHER): Payer: Self-pay | Admitting: Physician Assistant

## 2022-09-17 VITALS — BP 151/90 | HR 76 | Temp 97.6°F | Ht 63.0 in | Wt 135.0 lb

## 2022-09-17 DIAGNOSIS — F439 Reaction to severe stress, unspecified: Secondary | ICD-10-CM | POA: Diagnosis not present

## 2022-09-17 DIAGNOSIS — I10 Essential (primary) hypertension: Secondary | ICD-10-CM | POA: Diagnosis not present

## 2022-09-17 DIAGNOSIS — Z6823 Body mass index (BMI) 23.0-23.9, adult: Secondary | ICD-10-CM | POA: Diagnosis not present

## 2022-09-17 DIAGNOSIS — E669 Obesity, unspecified: Secondary | ICD-10-CM

## 2022-09-17 DIAGNOSIS — Z87891 Personal history of nicotine dependence: Secondary | ICD-10-CM

## 2022-09-17 DIAGNOSIS — E66811 Obesity, class 1: Secondary | ICD-10-CM

## 2022-09-17 MED ORDER — SEMAGLUTIDE-WEIGHT MANAGEMENT 1 MG/0.5ML ~~LOC~~ SOAJ
1.0000 mg | SUBCUTANEOUS | 0 refills | Status: DC
  Filled 2022-09-17: qty 2, 28d supply, fill #0

## 2022-09-17 MED ORDER — WEGOVY 1 MG/0.5ML ~~LOC~~ SOAJ: 1.0000 mg | SUBCUTANEOUS | 0 refills | Status: DC

## 2022-09-19 ENCOUNTER — Other Ambulatory Visit (HOSPITAL_COMMUNITY): Payer: Self-pay

## 2022-09-19 ENCOUNTER — Encounter: Payer: BC Managed Care – PPO | Admitting: Occupational Therapy

## 2022-09-26 ENCOUNTER — Encounter: Payer: BC Managed Care – PPO | Admitting: Occupational Therapy

## 2022-09-30 NOTE — Progress Notes (Unsigned)
Chief Complaint:   OBESITY Gina Washington is here to discuss her progress with her obesity treatment plan along with follow-up of her obesity related diagnoses. Gina Washington is on the Category 2 Plan and states she is following her eating plan approximately 75% of the time. Gina Washington states she is walking 30 minutes 3 times per week.  Today's visit was #: 23 Starting weight: 182 lbs Starting date: 11/16/2020 Today's weight: 135 lbs Today's date: 09/17/2022 Total lbs lost to date: 47 lbs Total lbs lost since last in-office visit: 3  Interim History: Gina Washington has done well with weight loss. Working on maintenance--has been going 10-14 days between dosing and has done well. Appropriate hunger/appetite.  Subjective:   1. Essential hypertension Gina Washington's blood pressure increased today. Reports very stressed by work injuring and events surrounding this. Taking Toprol XL, Diovan, no side effects.  2. Stress Gina Washington is going to counseling and seeing psychiatry related to work stress/injury which occurred at work.  Assessment/Plan:   1. Essential hypertension Continue medications. Continue healthy eating plan and exercise.  2. Stress Continue medications. Follow up with psychiatry and counseling.  3. Obesity, current BMI 23.9 We will refill Wegovy 1 mg SQ once weekly for 1 month with 0 refills. Take every 10-14 day. Om maintenance and consider decreasing dose next visit.   -Refill Semaglutide-Weight Management 1 MG/0.5ML SOAJ; Inject 1 mg into the skin once a week. Patient to take every 10 to 14 days, on taper/maintenance dosing.  Dispense: 2 mL; Refill: 0  Gina Washington is currently in the action stage of change. As such, her goal is to continue with weight loss efforts. She has agreed to the Category 2 Plan.   Exercise goals: As is.  Behavioral modification strategies: increasing lean protein intake and holiday eating strategies .  Gina Washington has agreed to follow-up with our clinic in 6 weeks. She  was informed of the importance of frequent follow-up visits to maximize her success with intensive lifestyle modifications for her multiple health conditions.   Objective:   Blood pressure (!) 151/90, pulse 76, temperature 97.6 F (36.4 C), height '5\' 3"'$  (1.6 m), weight 135 lb (61.2 kg), SpO2 98 %. Body mass index is 23.91 kg/m.  General: Cooperative, alert, well developed, in no acute distress. HEENT: Conjunctivae and lids unremarkable. Cardiovascular: Regular rhythm.  Lungs: Normal work of breathing. Neurologic: No focal deficits.   Lab Results  Component Value Date   CREATININE 0.89 05/22/2022   BUN 17 05/22/2022   NA 139 05/22/2022   K 4.8 05/22/2022   CL 97 05/22/2022   CO2 24 05/22/2022   Lab Results  Component Value Date   ALT 27 05/22/2022   AST 43 (H) 05/22/2022   ALKPHOS 97 05/22/2022   BILITOT 0.5 05/22/2022   Lab Results  Component Value Date   HGBA1C 5.2 05/22/2022   HGBA1C 5.3 02/13/2022   HGBA1C 5.5 11/16/2020   Lab Results  Component Value Date   INSULIN 2.1 (L) 05/22/2022   INSULIN 6.9 02/13/2022   INSULIN 4.2 11/16/2020   Lab Results  Component Value Date   TSH 2.500 11/16/2020   Lab Results  Component Value Date   CHOL 211 (H) 05/29/2021   HDL 84 05/29/2021   LDLCALC 115 (H) 05/29/2021   TRIG 67 05/29/2021   CHOLHDL 2.2 11/16/2020   Lab Results  Component Value Date   VD25OH 126.0 (H) 05/22/2022   VD25OH 76.0 02/13/2022   VD25OH 74.3 05/29/2021   Lab Results  Component Value  Date   WBC 6.4 02/13/2022   HGB 14.4 02/13/2022   HCT 41.9 02/13/2022   MCV 99 (H) 02/13/2022   PLT 358 02/13/2022   Lab Results  Component Value Date   IRON 233 (H) 11/16/2020   TIBC 344 11/16/2020   FERRITIN 449 (H) 11/16/2020   Attestation Statements:   Reviewed by clinician on day of visit: allergies, medications, problem list, medical history, surgical history, family history, social history, and previous encounter notes.  I, Brendell Tyus, am  acting as transcriptionist for [employer], [employer].  I have reviewed the above documentation for accuracy and completeness, and I agree with the above. -  ***

## 2022-10-03 ENCOUNTER — Encounter: Payer: BC Managed Care – PPO | Admitting: Occupational Therapy

## 2022-10-10 ENCOUNTER — Ambulatory Visit: Payer: BC Managed Care – PPO | Admitting: Physical Therapy

## 2022-10-17 ENCOUNTER — Ambulatory Visit: Payer: BC Managed Care – PPO | Admitting: Physical Therapy

## 2022-10-23 ENCOUNTER — Other Ambulatory Visit: Payer: Self-pay | Admitting: Psychiatry

## 2022-10-24 ENCOUNTER — Ambulatory Visit: Payer: BC Managed Care – PPO | Admitting: Physical Therapy

## 2022-10-28 ENCOUNTER — Encounter (INDEPENDENT_AMBULATORY_CARE_PROVIDER_SITE_OTHER): Payer: Self-pay | Admitting: Adult Health

## 2022-10-28 ENCOUNTER — Ambulatory Visit (INDEPENDENT_AMBULATORY_CARE_PROVIDER_SITE_OTHER): Payer: BC Managed Care – PPO | Admitting: Adult Health

## 2022-10-28 DIAGNOSIS — R110 Nausea: Secondary | ICD-10-CM | POA: Insufficient documentation

## 2022-10-28 DIAGNOSIS — Z6824 Body mass index (BMI) 24.0-24.9, adult: Secondary | ICD-10-CM

## 2022-10-28 DIAGNOSIS — E669 Obesity, unspecified: Secondary | ICD-10-CM | POA: Diagnosis not present

## 2022-10-28 MED ORDER — ONDANSETRON HCL 4 MG PO TABS: 4.0000 mg | ORAL_TABLET | Freq: Three times a day (TID) | ORAL | 0 refills | Status: DC | PRN

## 2022-10-28 MED ORDER — SEMAGLUTIDE-WEIGHT MANAGEMENT 1 MG/0.5ML ~~LOC~~ SOAJ: 1.0000 mg | SUBCUTANEOUS | 0 refills | Status: DC

## 2022-10-31 ENCOUNTER — Ambulatory Visit: Payer: BC Managed Care – PPO | Admitting: Physical Therapy

## 2022-11-05 NOTE — Progress Notes (Unsigned)
Chief Complaint:   OBESITY Gina Washington is here to discuss her progress with her obesity treatment plan along with follow-up of her obesity related diagnoses. Gina Washington is on the Category 2 Plan and states she is following her eating plan approximately 50% of the time. Gina Washington states she is walking 20-30 minutes 3 times per week.  Today's visit was #: 24 Starting weight: 182 LBS Starting date: 11/16/2020 Today's weight: 136 LBS Today's date: 10/28/2022 Total lbs lost to date: 18 LBS Total lbs lost since last in-office visit: +1 LB  Interim History: ***  Subjective:   1. Nausea Patient is having nausea symptoms 1 times per week.  Patient injects Ozempic 1 mg every 10 days, 2 days after Ozempic she uses Zofran dose.  She injects on Tuesday.  Assessment/Plan:   1. Nausea Refill- ondansetron (ZOFRAN) 4 MG tablet; Take 1 tablet (4 mg total) by mouth every 8 (eight) hours as needed for nausea or vomiting.  Dispense: 20 tablet; Refill: 0  2. Obesity, current BMI 24.2 Refill- Semaglutide-Weight Management 1 MG/0.5ML SOAJ; Inject 1 mg into the skin once a week. Patient to take every 10 to 14 days, on taper/maintenance dosing.  Dispense: 2 mL; Refill: 0  Gina Washington is currently in the action stage of change. As such, her goal is to continue with weight loss efforts. She has agreed to the Category 2 Plan.   Exercise goals:  As is.  Behavioral modification strategies: increasing lean protein intake, decreasing simple carbohydrates, meal planning and cooking strategies, keeping healthy foods in the home, and planning for success.  [name] has agreed to follow-up with our clinic in 8-10 weeks. She was informed of the importance of frequent follow-up visits to maximize her success with intensive lifestyle modifications for her multiple health conditions.   Objective:   Blood pressure 123/87, pulse 74, temperature 98 F (36.7 C), height '5\' 3"'$  (1.6 m), weight 136 lb (61.7 kg), SpO2 98 %. Body mass  index is 24.09 kg/m.  General: Cooperative, alert, well developed, in no acute distress. HEENT: Conjunctivae and lids unremarkable. Cardiovascular: Regular rhythm.  Lungs: Normal work of breathing. Neurologic: No focal deficits.   Lab Results  Component Value Date   CREATININE 0.89 05/22/2022   BUN 17 05/22/2022   NA 139 05/22/2022   K 4.8 05/22/2022   CL 97 05/22/2022   CO2 24 05/22/2022   Lab Results  Component Value Date   ALT 27 05/22/2022   AST 43 (H) 05/22/2022   ALKPHOS 97 05/22/2022   BILITOT 0.5 05/22/2022   Lab Results  Component Value Date   HGBA1C 5.2 05/22/2022   HGBA1C 5.3 02/13/2022   HGBA1C 5.5 11/16/2020   Lab Results  Component Value Date   INSULIN 2.1 (L) 05/22/2022   INSULIN 6.9 02/13/2022   INSULIN 4.2 11/16/2020   Lab Results  Component Value Date   TSH 2.500 11/16/2020   Lab Results  Component Value Date   CHOL 211 (H) 05/29/2021   HDL 84 05/29/2021   LDLCALC 115 (H) 05/29/2021   TRIG 67 05/29/2021   CHOLHDL 2.2 11/16/2020   Lab Results  Component Value Date   VD25OH 126.0 (H) 05/22/2022   VD25OH 76.0 02/13/2022   VD25OH 74.3 05/29/2021   Lab Results  Component Value Date   WBC 6.4 02/13/2022   HGB 14.4 02/13/2022   HCT 41.9 02/13/2022   MCV 99 (H) 02/13/2022   PLT 358 02/13/2022   Lab Results  Component Value Date  IRON 233 (H) 11/16/2020   TIBC 344 11/16/2020   FERRITIN 449 (H) 11/16/2020    Attestation Statements:   Reviewed by clinician on day of visit: allergies, medications, problem list, medical history, surgical history, family history, social history, and previous encounter notes.  I, Davy Pique, RMA, am acting as Location manager for Mina Marble, NP.  I have reviewed the above documentation for accuracy and completeness, and I agree with the above. -  ***

## 2022-11-07 ENCOUNTER — Ambulatory Visit: Payer: BC Managed Care – PPO | Admitting: Physical Therapy

## 2022-11-14 ENCOUNTER — Ambulatory Visit: Payer: BC Managed Care – PPO | Admitting: Physical Therapy

## 2022-11-21 ENCOUNTER — Ambulatory Visit: Payer: BC Managed Care – PPO | Admitting: Neurology

## 2022-11-21 ENCOUNTER — Encounter: Payer: Self-pay | Admitting: Neurology

## 2022-11-21 VITALS — BP 134/90 | HR 85 | Ht 63.0 in | Wt 136.0 lb

## 2022-11-21 DIAGNOSIS — G259 Extrapyramidal and movement disorder, unspecified: Secondary | ICD-10-CM | POA: Diagnosis not present

## 2022-11-21 DIAGNOSIS — R29898 Other symptoms and signs involving the musculoskeletal system: Secondary | ICD-10-CM

## 2022-11-21 DIAGNOSIS — R269 Unspecified abnormalities of gait and mobility: Secondary | ICD-10-CM

## 2022-11-21 MED ORDER — RASAGILINE MESYLATE 1 MG PO TABS: 1.0000 mg | ORAL_TABLET | Freq: Every day | ORAL | 11 refills | Status: AC
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
## 2022-11-21 NOTE — Patient Instructions (Signed)
Add on Azilect again 1 mg, caution with other medication Vyvanse and Wellbutrin   See you back in 6 months

## 2022-11-28 ENCOUNTER — Ambulatory Visit: Payer: BC Managed Care – PPO | Admitting: Physical Therapy

## 2022-12-03 ENCOUNTER — Other Ambulatory Visit (HOSPITAL_COMMUNITY): Payer: Self-pay

## 2022-12-03 ENCOUNTER — Encounter (INDEPENDENT_AMBULATORY_CARE_PROVIDER_SITE_OTHER): Payer: Self-pay | Admitting: Physician Assistant

## 2022-12-03 ENCOUNTER — Ambulatory Visit (INDEPENDENT_AMBULATORY_CARE_PROVIDER_SITE_OTHER): Payer: BC Managed Care – PPO | Admitting: Physician Assistant

## 2022-12-03 VITALS — BP 135/86 | HR 80 | Temp 98.1°F | Ht 63.0 in | Wt 135.0 lb

## 2022-12-03 DIAGNOSIS — I10 Essential (primary) hypertension: Secondary | ICD-10-CM | POA: Diagnosis not present

## 2022-12-03 DIAGNOSIS — Z6824 Body mass index (BMI) 24.0-24.9, adult: Secondary | ICD-10-CM | POA: Diagnosis not present

## 2022-12-03 MED ORDER — SEMAGLUTIDE-WEIGHT MANAGEMENT 1 MG/0.5ML ~~LOC~~ SOAJ
1.0000 mg | SUBCUTANEOUS | 0 refills | Status: DC
  Filled 2022-12-03 – 2022-12-18 (×2): qty 2, 28d supply, fill #0

## 2022-12-03 NOTE — Progress Notes (Signed)
Office: [phone]  /  Fax: [facility] Height: 5' 3"$  (1.6 m) Weight: 135 lb (61.2 kg) Temp: 98.1 F (36.7 C) Pulse Rate: 80 BP: 135/86 SpO2: 97 % Fasting: No Labs: No Today's Visit #: 25 Weight at Last VIsit: 136 lb  Body Fat %: 30.2 % Fat Mass (lbs): 40.8 lbs Muscle Mass (lbs): 89.8 lbs Total Body Water (lbs): 65.6 lbs Visceral Fat Rating : 6 Peak Weight: 213 lb Starting Date: 11/16/20 Starting Weight: 187 lb Total Weight Loss (lbs): 46 lb (20.9 kg)    HPI  Chief Complaint: OBESITY  Gina Washington is here to discuss her progress with her obesity treatment plan. She is on the the Category 1 Plan and states she is following her eating plan approximately 60 % of the time. She states she is exercising 30 minutes 3 times per week.   Interval History:  Since last office visit she has been started on a new medication, rasagiline for tremor by neurology.  She has been on Wegovy 1 mg every 10-14 days and reports no nausea except on very rare occasions now following Gina Washington administration. She is anxious about her insurance may not cover in the near future.  She has been on weight maintenance Phase since 05/22/2022.     Pharmacotherapy: Wegovy 1 mg every 10 to 14 days.   PHYSICAL EXAM:  Blood pressure 135/86, pulse 80, temperature 98.1 F (36.7 C), height 5' 3"$  (1.6 m), weight 135 lb (61.2 kg), SpO2 97 %. Body mass index is 23.91 kg/m.  General: She is overweight, cooperative, alert, well developed, and in no acute distress. PSYCH: Has normal mood, affect and thought process.   HEENT: EOMI, sclerae are anicteric. Lungs: Normal breathing effort, no conversational dyspnea. Extremities: No edema.  Neurologic: No gross sensory or motor deficits. No tremors or fasciculations noted.    ASSESSMENT AND PLAN  TREATMENT PLAN FOR OBESITY:  Recommended Dietary Goals  Gina Washington is currently in the action stage of change. As  such, her goal is to continue weight management plan. She has agreed to the Category 1 Plan.  Behavioral Intervention  We discussed the following Behavioral Modification Strategies today: increasing lean protein intake, decreasing simple carbohydrates , increasing vegetables, increase water intake, work on meal planning and easy cooking plans, and think about ways to increase physical activity.  Additional resources provided today: NA  Recommended Physical Activity Goals  Gina Washington has been advised to work up to 150 minutes of moderate intensity aerobic activity a week and strengthening exercises 2-3 times per week for cardiovascular health, weight loss maintenance and preservation of muscle mass.   She has agreed to increase physical activity in their day and reduce sedentary time (increase NEAT).  and continue physical activity as is.    Pharmacotherapy We discussed various medication options to help Gina Washington with her weight loss efforts and we both agreed to continue Wegovy 1 mg every 10 to 14 days.  ASSOCIATED CONDITIONS ADDRESSED TODAY  Essential hypertension  BMI 24.0-24.9, adult, Current BMI 24.0 -     Semaglutide-Weight Management; Inject 1 mg into the skin once a week. Patient to take every 10 to 14 days, on taper/maintenance dosing.  Dispense: 2 mL; Refill: 0   HTN:  On Toprol XL 100 mg daily and Diovan 160 mg daily. Renal function is normal. Working on decreasing sodium in diet. She was started on a new medication for tremor, rasagiline and BP was slightly elevated today. She  is not sure if the new medication.  She is also on Wellbutrin which may raise her BP as well. Will monitor over the next month and see if she remains on rasagiline and if adjustments need to be made in her medication regimen.  She continues to work on nutrition plan to promote weight loss and improve BP control.   DIAGNOSTIC DATA REVIEWED:  BMET    Component Value Date/Time   NA 139 05/22/2022 0847    K 4.8 05/22/2022 0847   CL 97 05/22/2022 0847   CO2 24 05/22/2022 0847   GLUCOSE 89 05/22/2022 0847   BUN 17 05/22/2022 0847   CREATININE 0.89 05/22/2022 0847   CALCIUM 10.3 (H) 05/22/2022 0847   GFRNONAA 67 11/16/2020 1005   GFRAA 77 11/16/2020 1005   Lab Results  Component Value Date   HGBA1C 5.2 05/22/2022   HGBA1C 5.5 11/16/2020   Lab Results  Component Value Date   INSULIN 2.1 (L) 05/22/2022   INSULIN 4.2 11/16/2020   Lab Results  Component Value Date   TSH 2.500 11/16/2020   CBC    Component Value Date/Time   WBC 6.4 02/13/2022 0951   RBC 4.24 02/13/2022 0951   HGB 14.4 02/13/2022 0951   HCT 41.9 02/13/2022 0951   PLT 358 02/13/2022 0951   MCV 99 (H) 02/13/2022 0951   MCH 34.0 (H) 02/13/2022 0951   MCHC 34.4 02/13/2022 0951   RDW 12.6 02/13/2022 0951   Iron Studies    Component Value Date/Time   IRON 233 (H) 11/16/2020 1005   TIBC 344 11/16/2020 1005   FERRITIN 449 (H) 11/16/2020 1005   IRONPCTSAT 68 (H) 11/16/2020 1005   Lipid Panel     Component Value Date/Time   CHOL 211 (H) 05/29/2021 1022   TRIG 67 05/29/2021 1022   HDL 84 05/29/2021 1022   CHOLHDL 2.2 11/16/2020 1005   LDLCALC 115 (H) 05/29/2021 1022   Hepatic Function Panel     Component Value Date/Time   PROT 7.3 05/22/2022 0847   ALBUMIN 4.6 05/22/2022 0847   AST 43 (H) 05/22/2022 0847   ALT 27 05/22/2022 0847   ALKPHOS 97 05/22/2022 0847   BILITOT 0.5 05/22/2022 0847      Component Value Date/Time   TSH 2.500 11/16/2020 1005   Nutritional Lab Results  Component Value Date   VD25OH 126.0 (H) 05/22/2022   VD25OH 76.0 02/13/2022   VD25OH 74.3 05/29/2021      Return in about 4 weeks (around 12/31/2022).[name] Kitchen She was informed of the importance of frequent follow up visits to maximize her success with intensive lifestyle modifications for her multiple health conditions.    ATTESTASTION STATEMENTS:  Reviewed by clinician on day of visit: allergies, medications, problem list,  medical history, surgical history, family history, social history, and previous encounter notes.   Time spent on visit including pre-visit chart review and post-visit care and charting was 35 minutes.   Gina Sanagustin,PA-C

## 2022-12-16 ENCOUNTER — Other Ambulatory Visit (HOSPITAL_COMMUNITY): Payer: Self-pay

## 2022-12-18 ENCOUNTER — Other Ambulatory Visit (HOSPITAL_COMMUNITY): Payer: Self-pay

## 2022-12-19 ENCOUNTER — Other Ambulatory Visit (HOSPITAL_COMMUNITY): Payer: Self-pay

## 2022-12-23 ENCOUNTER — Other Ambulatory Visit: Payer: Self-pay

## 2022-12-23 ENCOUNTER — Telehealth: Payer: Self-pay | Admitting: Psychiatry

## 2022-12-23 ENCOUNTER — Ambulatory Visit (INDEPENDENT_AMBULATORY_CARE_PROVIDER_SITE_OTHER): Payer: BC Managed Care – PPO | Admitting: Adult Health

## 2022-12-23 DIAGNOSIS — F902 Attention-deficit hyperactivity disorder, combined type: Secondary | ICD-10-CM

## 2022-12-23 MED ORDER — LISDEXAMFETAMINE DIMESYLATE 50 MG PO CAPS: 50.0000 mg | ORAL_CAPSULE | Freq: Every day | ORAL | 0 refills | Status: DC

## 2022-12-23 MED ORDER — LISDEXAMFETAMINE DIMESYLATE 50 MG PO CAPS: ORAL_CAPSULE | ORAL | 0 refills | Status: DC

## 2022-12-23 NOTE — Telephone Encounter (Signed)
Pended.

## 2022-12-23 NOTE — Telephone Encounter (Signed)
Received fax request from [facility] for Vyvanse.

## 2022-12-30 ENCOUNTER — Telehealth: Payer: Self-pay | Admitting: Psychiatry

## 2022-12-30 ENCOUNTER — Telehealth: Payer: Self-pay

## 2022-12-30 DIAGNOSIS — F902 Attention-deficit hyperactivity disorder, combined type: Secondary | ICD-10-CM

## 2022-12-30 MED ORDER — AMPHETAMINE-DEXTROAMPHET ER 20 MG PO CP24: 20.0000 mg | ORAL_CAPSULE | Freq: Every day | ORAL | 0 refills | Status: DC

## 2022-12-30 NOTE — Telephone Encounter (Addendum)
Prior Authorization Amphetamine-Dextroamphet ER 20MG er capsules #30/30 [employer]   

## 2022-12-30 NOTE — Telephone Encounter (Signed)
Kelty called at 11:14 to report that she went to pick up her Vyvanse and the cost was really high. Wanted to know if you knew of any savings cards. I did tell her that since it has generic there are no  longer coupons or assistance.  She is wondering if she should just go back to the Adderall.  Please Advise.  She did say you could respond in the mychart portal if that was better for you.

## 2022-12-31 NOTE — Telephone Encounter (Signed)
[facility] [employer] approved Amphetamine-Dextroamphetamine ER 20mg  ( 12/30/22-12/29/2025)  [phone]

## 2023-01-01 ENCOUNTER — Ambulatory Visit (INDEPENDENT_AMBULATORY_CARE_PROVIDER_SITE_OTHER): Payer: BC Managed Care – PPO | Admitting: Adult Health

## 2023-01-01 ENCOUNTER — Encounter (INDEPENDENT_AMBULATORY_CARE_PROVIDER_SITE_OTHER): Payer: Self-pay | Admitting: Adult Health

## 2023-01-01 VITALS — BP 123/82 | HR 60 | Temp 98.0°F | Ht 63.0 in | Wt 141.0 lb

## 2023-01-01 DIAGNOSIS — I10 Essential (primary) hypertension: Secondary | ICD-10-CM | POA: Diagnosis not present

## 2023-01-01 DIAGNOSIS — R110 Nausea: Secondary | ICD-10-CM

## 2023-01-01 DIAGNOSIS — E559 Vitamin D deficiency, unspecified: Secondary | ICD-10-CM

## 2023-01-01 DIAGNOSIS — E669 Obesity, unspecified: Secondary | ICD-10-CM | POA: Diagnosis not present

## 2023-01-01 DIAGNOSIS — Z6824 Body mass index (BMI) 24.0-24.9, adult: Secondary | ICD-10-CM

## 2023-01-01 MED ORDER — ONDANSETRON HCL 4 MG PO TABS: 4.0000 mg | ORAL_TABLET | Freq: Three times a day (TID) | ORAL | 0 refills | Status: AC | PRN

## 2023-01-01 MED ORDER — WEGOVY 0.5 MG/0.5ML ~~LOC~~ SOAJ: SUBCUTANEOUS | 0 refills | Status: DC
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
## 2023-02-12 ENCOUNTER — Ambulatory Visit (INDEPENDENT_AMBULATORY_CARE_PROVIDER_SITE_OTHER): Payer: BC Managed Care – PPO | Admitting: Adult Health

## 2023-02-20 ENCOUNTER — Other Ambulatory Visit (HOSPITAL_COMMUNITY): Payer: Self-pay

## 2023-02-20 ENCOUNTER — Encounter: Payer: Self-pay | Admitting: Psychiatry

## 2023-02-20 ENCOUNTER — Ambulatory Visit: Payer: BC Managed Care – PPO | Admitting: Psychiatry

## 2023-02-20 VITALS — BP 138/88 | HR 76

## 2023-02-20 DIAGNOSIS — F411 Generalized anxiety disorder: Secondary | ICD-10-CM

## 2023-02-20 DIAGNOSIS — F4310 Post-traumatic stress disorder, unspecified: Secondary | ICD-10-CM

## 2023-02-20 DIAGNOSIS — F902 Attention-deficit hyperactivity disorder, combined type: Secondary | ICD-10-CM | POA: Diagnosis not present

## 2023-02-20 DIAGNOSIS — F3342 Major depressive disorder, recurrent, in full remission: Secondary | ICD-10-CM | POA: Diagnosis not present

## 2023-02-20 MED ORDER — LISDEXAMFETAMINE DIMESYLATE 50 MG PO CAPS
50.0000 mg | ORAL_CAPSULE | Freq: Every day | ORAL | 0 refills | Status: DC
  Filled 2023-02-20: qty 30, 30d supply, fill #0

## 2023-02-20 MED ORDER — BUSPIRONE HCL 15 MG PO TABS: ORAL_TABLET | ORAL | 5 refills | Status: DC
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
## 2023-02-21 ENCOUNTER — Other Ambulatory Visit (HOSPITAL_COMMUNITY): Payer: Self-pay

## 2023-02-24 ENCOUNTER — Telehealth: Payer: Self-pay

## 2023-02-24 ENCOUNTER — Other Ambulatory Visit (HOSPITAL_COMMUNITY): Payer: Self-pay

## 2023-02-24 NOTE — Telephone Encounter (Addendum)
Prior Authorization Lisdexamfetamine Dimesylate 50MG  capsules #30/30 [facility]  Brand sent also

## 2023-02-25 ENCOUNTER — Other Ambulatory Visit (HOSPITAL_COMMUNITY): Payer: Self-pay

## 2023-02-25 NOTE — Telephone Encounter (Signed)
[facility]  approved VYVANSE 50mg  02/24/23-02/23/26

## 2023-02-27 ENCOUNTER — Other Ambulatory Visit (HOSPITAL_COMMUNITY): Payer: Self-pay

## 2023-03-31 ENCOUNTER — Telehealth: Payer: Self-pay | Admitting: Psychiatry

## 2023-03-31 DIAGNOSIS — F902 Attention-deficit hyperactivity disorder, combined type: Secondary | ICD-10-CM

## 2023-03-31 MED ORDER — LISDEXAMFETAMINE DIMESYLATE 50 MG PO CAPS: 50.0000 mg | ORAL_CAPSULE | Freq: Every day | ORAL | 0 refills | Status: DC

## 2023-03-31 MED ORDER — LISDEXAMFETAMINE DIMESYLATE 50 MG PO CAPS: ORAL_CAPSULE | ORAL | 0 refills | Status: DC

## 2023-03-31 NOTE — Telephone Encounter (Signed)
Please let her know a script has been sent for this month and next month.

## 2023-03-31 NOTE — Telephone Encounter (Signed)
Pt LVM @ 1:08p.  She said she is supposed to switch back to Vyvanse from Adderall.  Pls send script to [facility].  Next appt 8/9

## 2023-04-11 ENCOUNTER — Telehealth: Payer: Self-pay | Admitting: Psychiatry

## 2023-04-11 NOTE — Telephone Encounter (Signed)
Please call to schedule an earlier appt with Shanda Bumps.

## 2023-04-11 NOTE — Telephone Encounter (Signed)
Pt LVM @ 2:46p stating she has questions about her medications from [place].  Next appt 8/9

## 2023-04-14 NOTE — Telephone Encounter (Signed)
Pt is scheduled 7/5 

## 2023-04-18 ENCOUNTER — Telehealth (INDEPENDENT_AMBULATORY_CARE_PROVIDER_SITE_OTHER): Payer: BC Managed Care – PPO | Admitting: Psychiatry

## 2023-04-18 ENCOUNTER — Encounter: Payer: Self-pay | Admitting: Psychiatry

## 2023-04-18 DIAGNOSIS — F3342 Major depressive disorder, recurrent, in full remission: Secondary | ICD-10-CM

## 2023-04-18 DIAGNOSIS — F4310 Post-traumatic stress disorder, unspecified: Secondary | ICD-10-CM

## 2023-04-18 DIAGNOSIS — F902 Attention-deficit hyperactivity disorder, combined type: Secondary | ICD-10-CM

## 2023-04-18 DIAGNOSIS — F411 Generalized anxiety disorder: Secondary | ICD-10-CM | POA: Diagnosis not present

## 2023-04-18 MED ORDER — BUPROPION HCL ER (XL) 150 MG PO TB24: 150.0000 mg | ORAL_TABLET | Freq: Every day | ORAL | 1 refills | Status: DC

## 2023-04-18 MED ORDER — BUSPIRONE HCL 15 MG PO TABS: ORAL_TABLET | ORAL | 2 refills | Status: DC

## 2023-04-18 MED ORDER — LISDEXAMFETAMINE DIMESYLATE 50 MG PO CAPS: 50.0000 mg | ORAL_CAPSULE | Freq: Every day | ORAL | 0 refills | Status: DC

## 2023-04-18 NOTE — Progress Notes (Signed)
Gina Washington [id number] Jan 04, 1966 57 y.o.  Virtual Visit via Video Note  I connected with pt @ on 04/18/23 at 10:00 AM EDT by a video enabled telemedicine application and verified that I am speaking with the correct person using two identifiers.   I discussed the limitations of evaluation and management by telemedicine and the availability of in person appointments. The patient expressed understanding and agreed to proceed.  I discussed the assessment and treatment plan with the patient. The patient was provided an opportunity to ask questions and all were answered. The patient agreed with the plan and demonstrated an understanding of the instructions.   The patient was advised to call back or seek an in-person evaluation if the symptoms worsen or if the condition fails to improve as anticipated.  I provided 32 minutes of non-face-to-face time during this encounter.  The patient was located at home.  The provider was located at home.   Gina Washington, PMHNP   Subjective:   Patient ID:  Gina Washington is a 57 y.o. (DOB [birth year]) female.  Chief Complaint:  Chief Complaint  Patient presents with   Anxiety    HPI Gina Washington presents for worsening anxiety and follow-up of depression and ADHD. She reports that she was diagnosed with Parkinson's disease on 04/07/23. She reports that Buspar was "ok in the beginning, and since last week I have been feeling more panicky." She reports panic feelings at least twice daily. She reports having an uneasy feeling "like something is not right." She reports that a few times she has felt her heart "flutter" with anxiety. Denies excessive worry.   She reports, "that in a way it's a relief" to learn diagnosis of Parkinsons disease since she has suspected this diagnosis. Denies irritability. Appetite has been good. She reports that she has been maintaining weight loss. She reports that her energy is "pretty good" and is less tired now that she is not working.  She reports that her motivation has been good and she has been able to do some projects over the summer. Concentration has been good with Vyvanse. She reports that her sleep has improved some over summer break. She reports occasional early morning awakenings. Denies SI.   She reports that she has stress in response to some possible changes in the future.   Vyvanse last filled 03/31/23.   Review of Systems:  Review of Systems  Gastrointestinal:  Positive for constipation.  Musculoskeletal:  Negative for gait problem.  Neurological:  Positive for tremors. Negative for dizziness and light-headedness.  Psychiatric/Behavioral:         Please refer to HPI    Medications: I have reviewed the patient's current medications.  Current Outpatient Medications  Medication Sig Dispense Refill   carbidopa-levodopa (SINEMET IR) 25-100 MG tablet Take 1 tablet by mouth 3 (three) times daily.     lisdexamfetamine (VYVANSE) 50 MG capsule Take 1 capsule (50 mg total) by mouth daily. 30 capsule 0   metoprolol succinate (TOPROL-XL) 100 MG 24 hr tablet Take 100 mg by mouth daily.     omeprazole (PRILOSEC) 20 MG capsule Take 20 mg by mouth daily.     valsartan (DIOVAN) 160 MG tablet Take 160 mg by mouth daily.     Biotin 10 MG/ML LIQD Take by mouth. (Patient not taking: Reported on 04/18/2023)     buPROPion (WELLBUTRIN XL) 150 MG 24 hr tablet Take 1 tablet (150 mg total) by mouth daily. 90 tablet 1   busPIRone (BUSPAR) 15 MG tablet  Take 1.5 tablets twice daily 90 tablet 2   cyanocobalamin (,VITAMIN B-12,) 1000 MCG/ML injection Inject 1,000 mcg into the muscle every 28 (twenty-eight) days. (Patient not taking: Reported on 04/18/2023)     [START ON 04/28/2023] lisdexamfetamine (VYVANSE) 50 MG capsule TAKE 1 CAPSULE BY MOUTH EVERY DAY 30 capsule 0   [START ON 05/26/2023] lisdexamfetamine (VYVANSE) 50 MG capsule Take 1 capsule (50 mg total) by mouth daily. 30 capsule 0   [START ON 06/23/2023] lisdexamfetamine (VYVANSE) 50 MG  capsule Take 1 capsule (50 mg total) by mouth daily. 30 capsule 0   ondansetron (ZOFRAN) 4 MG tablet Take 1 tablet (4 mg total) by mouth every 8 (eight) hours as needed for nausea or vomiting. 20 tablet 0   rasagiline (AZILECT) 1 MG TABS tablet Take 1 tablet (1 mg total) by mouth daily. (Patient not taking: Reported on 02/20/2023) 30 tablet 11   No current facility-administered medications for this visit.    Medication Side Effects: None  Allergies:  Allergies  Allergen Reactions   Bactrim [Sulfamethoxazole-Trimethoprim] Hives   Codeine Nausea And Vomiting   Latex Hives   Triamterene Cough    Past Medical History:  Diagnosis Date   ADHD    Anxiety    B12 deficiency    Back pain    Basal cell carcinoma    leg   Basal cell carcinoma    Constipation    Functional movement disorder    GERD (gastroesophageal reflux disease)    Herpes zoster without complication    Hypertension    Kidney problem    Lower extremity edema    Migraine    Neurological movement disorder    Vaginal Pap smear, abnormal    age 30 yr- cryo done    Family History  Problem Relation Age of Onset   Diabetes Maternal Grandmother    Cancer Maternal Grandfather        lung   Hypertension Father    Stroke Father    Obesity Father    Hypertension Mother    Stroke Mother    Hyperlipidemia Mother    Kidney disease Mother    Obesity Mother    Dementia Paternal Grandmother     Social History   Socioeconomic History   Marital status: Married    Spouse name: Not on file   Number of children: 1   Years of education: college   Highest education level: Bachelor's degree (e.g., BA, AB, BS)  Occupational History   Occupation: Runner, broadcasting/film/video  Tobacco Use   Smoking status: Former   Smokeless tobacco: Never  Building services engineer Use: Never used  Substance and Sexual Activity   Alcohol use: Yes    Comment: 5 drinks per week   Drug use: Never   Sexual activity: Yes    Birth control/protection: I.U.D.   Other Topics Concern   Not on file  Social History Narrative   Lives at home with her husband.   Right-handed.   One cup coffee per day.   Social Determinants of Health   Financial Resource Strain: Not on file  Food Insecurity: Not on file  Transportation Needs: Not on file  Physical Activity: Not on file  Stress: Not on file  Social Connections: Not on file  Intimate Partner Violence: Not on file    Past Medical History, Surgical history, Social history, and Family history were reviewed and updated as appropriate.   Please see review of systems for further details on the patient's review  from today.   Objective:   Physical Exam:  BP 128/82   Physical Exam Neurological:     Mental Status: She is alert and oriented to person, place, and time.     Cranial Nerves: No dysarthria.  Psychiatric:        Attention and Perception: Attention and perception normal.        Mood and Affect: Mood is anxious. Mood is not depressed.        Speech: Speech normal.        Behavior: Behavior is cooperative.        Thought Content: Thought content normal. Thought content is not paranoid or delusional. Thought content does not include homicidal or suicidal ideation. Thought content does not include homicidal or suicidal plan.        Cognition and Memory: Cognition and memory normal.        Judgment: Judgment normal.     Comments: Insight intact     Lab Review:     Component Value Date/Time   NA 139 05/22/2022 0847   K 4.8 05/22/2022 0847   CL 97 05/22/2022 0847   CO2 24 05/22/2022 0847   GLUCOSE 89 05/22/2022 0847   BUN 17 05/22/2022 0847   CREATININE 0.89 05/22/2022 0847   CALCIUM 10.3 (H) 05/22/2022 0847   PROT 7.3 05/22/2022 0847   ALBUMIN 4.6 05/22/2022 0847   AST 43 (H) 05/22/2022 0847   ALT 27 05/22/2022 0847   ALKPHOS 97 05/22/2022 0847   BILITOT 0.5 05/22/2022 0847   GFRNONAA 67 11/16/2020 1005   GFRAA 77 11/16/2020 1005       Component Value Date/Time   WBC 6.4  02/13/2022 0951   RBC 4.24 02/13/2022 0951   HGB 14.4 02/13/2022 0951   HCT 41.9 02/13/2022 0951   PLT 358 02/13/2022 0951   MCV 99 (H) 02/13/2022 0951   MCH 34.0 (H) 02/13/2022 0951   MCHC 34.4 02/13/2022 0951   RDW 12.6 02/13/2022 0951   LYMPHSABS 1.5 02/13/2022 0951   EOSABS 0.1 02/13/2022 0951   BASOSABS 0.1 02/13/2022 0951    No results found for: "POCLITH", "LITHIUM"   No results found for: "PHENYTOIN", "PHENOBARB", "VALPROATE", "CBMZ"   .res Assessment: Plan:    Will increase Buspar to 15 mg 1.5 tablets twice daily for anxiety since she reports some improvement in anxiety with Buspar 15 mg twice daily until being recently diagnosed with Parkinson's disease. She reports that she has been tolerating Buspar without difficulty. Discussed that Buspar is approved up to 60 mg BID for anxiety. Pt reports that she would like to start with Buspar 15 mg 1.5 tablets twice daily to determine if this is helpful for her anxiety.  Will continue Vyvanse 50 mg daily for ADHD since pt reports that Vyvanse has been most effective for her ADHD symptoms.  Will continue Wellbutrin XL 150 mg daily for depression.  Pt to follow-up in 3 months or sooner if clinically indicated.  Patient advised to contact office with any questions, adverse effects, or acute worsening in signs and symptoms.   Micaela was seen today for anxiety.  Diagnoses and all orders for this visit:  Generalized anxiety disorder -     busPIRone (BUSPAR) 15 MG tablet; Take 1.5 tablets twice daily  PTSD (post-traumatic stress disorder) -     busPIRone (BUSPAR) 15 MG tablet; Take 1.5 tablets twice daily  Recurrent major depressive disorder, in full remission (HCC) -     buPROPion (WELLBUTRIN XL) 150 MG  24 hr tablet; Take 1 tablet (150 mg total) by mouth daily.  Attention deficit hyperactivity disorder, combined type -     lisdexamfetamine (VYVANSE) 50 MG capsule; Take 1 capsule (50 mg total) by mouth daily. -      lisdexamfetamine (VYVANSE) 50 MG capsule; Take 1 capsule (50 mg total) by mouth daily.     Please see After Visit Summary for patient specific instructions.  Future Appointments  Date Time Provider Department Center  05/23/2023  1:15 PM Gina Washington, PMHNP [facility] None  06/03/2023  1:15 PM Glean Salvo, NP [facility] None    No orders of the defined types were placed in this encounter.     -------------------------------

## 2023-04-30 ENCOUNTER — Other Ambulatory Visit: Payer: Self-pay | Admitting: Psychiatry

## 2023-04-30 DIAGNOSIS — F3342 Major depressive disorder, recurrent, in full remission: Secondary | ICD-10-CM

## 2023-05-23 ENCOUNTER — Ambulatory Visit (INDEPENDENT_AMBULATORY_CARE_PROVIDER_SITE_OTHER): Payer: BC Managed Care – PPO | Admitting: Psychiatry

## 2023-05-23 ENCOUNTER — Encounter: Payer: Self-pay | Admitting: Psychiatry

## 2023-05-23 DIAGNOSIS — F4310 Post-traumatic stress disorder, unspecified: Secondary | ICD-10-CM

## 2023-05-23 DIAGNOSIS — F3342 Major depressive disorder, recurrent, in full remission: Secondary | ICD-10-CM

## 2023-05-23 DIAGNOSIS — F902 Attention-deficit hyperactivity disorder, combined type: Secondary | ICD-10-CM | POA: Diagnosis not present

## 2023-05-23 DIAGNOSIS — F411 Generalized anxiety disorder: Secondary | ICD-10-CM | POA: Diagnosis not present

## 2023-05-23 MED ORDER — BUPROPION HCL ER (XL) 150 MG PO TB24
150.0000 mg | ORAL_TABLET | Freq: Every day | ORAL | 1 refills | Status: AC
Start: 2023-05-23 — End: ?

## 2023-05-23 MED ORDER — BUSPIRONE HCL 15 MG PO TABS
15.0000 mg | ORAL_TABLET | Freq: Two times a day (BID) | ORAL | 1 refills | Status: AC
Start: 2023-05-23 — End: ?

## 2023-05-23 NOTE — Progress Notes (Unsigned)
Gina Washington [id number] 1965/11/03 57 y.o.  Subjective:   Patient ID:  Gina Washington is a 57 y.o. (DOB 1966/02/21) female.  Chief Complaint: No chief complaint on file.   HPI Gina Washington presents to the office today for follow-up of anxiety, depression, ADHD, and insomnia. She reports that she did not tolerate increase in Buspar and experienced dizziness. She reports that she had to decrease Buspar. She reports that her anxiety has improved and feels that she has been able to process Parkinson's diagnoses. Denies any recent panic symptoms. Denies nightmares- "just weird dreams." She reports some sleep disturbance at times. She reports that she slept well last night. Denies depressed mood. Energy and motivation have been good. She reports difficulty with concentration and reports that she had difficulty remembering where she was in the job search progress. Appetite has been "voracious." She has been making some dietary changes and reducing sweets. Denies SI.   She reports that she was offered a job this week and is looking forward to this.   She continues to see therapist.   Vyvanse last filled 04/30/23.   PHQ2-9    Flowsheet Row Office Visit from 11/16/2020 in [facility] Total Score 4  PHQ-9 Total Score 14      Flowsheet Row ED from 12/12/2021 in [facility] RISK CATEGORY No Risk        Review of Systems:  Review of Systems  Medications: I have reviewed the patient's current medications.  Current Outpatient Medications  Medication Sig Dispense Refill   Biotin 10 MG/ML LIQD Take by mouth. (Patient not taking: Reported on 04/18/2023)     buPROPion (WELLBUTRIN XL) 150 MG 24 hr tablet Take 1 tablet (150 mg total) by mouth daily. 90 tablet 1   busPIRone (BUSPAR) 15 MG tablet Take 1.5 tablets twice daily 90 tablet 2   carbidopa-levodopa (SINEMET IR) 25-100 MG tablet Take 1 tablet by mouth 3 (three) times  daily.     cyanocobalamin (,VITAMIN B-12,) 1000 MCG/ML injection Inject 1,000 mcg into the muscle every 28 (twenty-eight) days. (Patient not taking: Reported on 04/18/2023)     lisdexamfetamine (VYVANSE) 50 MG capsule Take 1 capsule (50 mg total) by mouth daily. 30 capsule 0   lisdexamfetamine (VYVANSE) 50 MG capsule TAKE 1 CAPSULE BY MOUTH EVERY DAY 30 capsule 0   [START ON 05/26/2023] lisdexamfetamine (VYVANSE) 50 MG capsule Take 1 capsule (50 mg total) by mouth daily. 30 capsule 0   [START ON 06/23/2023] lisdexamfetamine (VYVANSE) 50 MG capsule Take 1 capsule (50 mg total) by mouth daily. 30 capsule 0   metoprolol succinate (TOPROL-XL) 100 MG 24 hr tablet Take 100 mg by mouth daily.     omeprazole (PRILOSEC) 20 MG capsule Take 20 mg by mouth daily.     ondansetron (ZOFRAN) 4 MG tablet Take 1 tablet (4 mg total) by mouth every 8 (eight) hours as needed for nausea or vomiting. 20 tablet 0   rasagiline (AZILECT) 1 MG TABS tablet Take 1 tablet (1 mg total) by mouth daily. (Patient not taking: Reported on 02/20/2023) 30 tablet 11   valsartan (DIOVAN) 160 MG tablet Take 160 mg by mouth daily.     No current facility-administered medications for this visit.    Medication Side Effects: None  Allergies:  Allergies  Allergen Reactions   Bactrim [Sulfamethoxazole-Trimethoprim] Hives   Codeine Nausea And Vomiting   Latex Hives   Triamterene Cough  Past Medical History:  Diagnosis Date   ADHD    Anxiety    B12 deficiency    Back pain    Basal cell carcinoma    leg   Basal cell carcinoma    Constipation    Functional movement disorder    GERD (gastroesophageal reflux disease)    Herpes zoster without complication    Hypertension    Kidney problem    Lower extremity edema    Migraine    Neurological movement disorder    Parkinsonism    Vaginal Pap smear, abnormal    age 76 yr- cryo done    Past Medical History, Surgical history, Social history, and Family history were reviewed and  updated as appropriate.   Please see review of systems for further details on the patient's review from today.   Objective:   Physical Exam:  There were no vitals taken for this visit.  Physical Exam Constitutional:      General: She is not in acute distress. Musculoskeletal:        General: No deformity.  Neurological:     Mental Status: She is alert and oriented to person, place, and time.     Coordination: Coordination normal.  Psychiatric:        Attention and Perception: Attention and perception normal. She does not perceive auditory or visual hallucinations.        Mood and Affect: Mood normal. Mood is not anxious or depressed. Affect is not labile, blunt, angry or inappropriate.        Speech: Speech normal.        Behavior: Behavior normal.        Thought Content: Thought content normal. Thought content is not paranoid or delusional. Thought content does not include homicidal or suicidal ideation. Thought content does not include homicidal or suicidal plan.        Cognition and Memory: Cognition and memory normal.        Judgment: Judgment normal.     Comments: Insight intact     Lab Review:     Component Value Date/Time   NA 139 05/22/2022 0847   K 4.8 05/22/2022 0847   CL 97 05/22/2022 0847   CO2 24 05/22/2022 0847   GLUCOSE 89 05/22/2022 0847   BUN 17 05/22/2022 0847   CREATININE 0.89 05/22/2022 0847   CALCIUM 10.3 (H) 05/22/2022 0847   PROT 7.3 05/22/2022 0847   ALBUMIN 4.6 05/22/2022 0847   AST 43 (H) 05/22/2022 0847   ALT 27 05/22/2022 0847   ALKPHOS 97 05/22/2022 0847   BILITOT 0.5 05/22/2022 0847   GFRNONAA 67 11/16/2020 1005   GFRAA 77 11/16/2020 1005       Component Value Date/Time   WBC 6.4 02/13/2022 0951   RBC 4.24 02/13/2022 0951   HGB 14.4 02/13/2022 0951   HCT 41.9 02/13/2022 0951   PLT 358 02/13/2022 0951   MCV 99 (H) 02/13/2022 0951   MCH 34.0 (H) 02/13/2022 0951   MCHC 34.4 02/13/2022 0951   RDW 12.6 02/13/2022 0951   LYMPHSABS  1.5 02/13/2022 0951   EOSABS 0.1 02/13/2022 0951   BASOSABS 0.1 02/13/2022 0951    No results found for: "POCLITH", "LITHIUM"   No results found for: "PHENYTOIN", "PHENOBARB", "VALPROATE", "CBMZ"   .res Assessment: Plan:   Cancel RX's at [facility]. Keep Aug and move Sept.    There are no diagnoses linked to this encounter.   Please see After Visit Summary for patient specific  instructions.  Future Appointments  Date Time Provider Department Center  06/03/2023  1:15 PM Glean Salvo, NP [facility] None    No orders of the defined types were placed in this encounter.   -------------------------------

## 2023-05-24 MED ORDER — LISDEXAMFETAMINE DIMESYLATE 50 MG PO CAPS
ORAL_CAPSULE | ORAL | 0 refills | Status: DC
Start: 2023-07-23 — End: 2023-07-08

## 2023-05-24 MED ORDER — LISDEXAMFETAMINE DIMESYLATE 50 MG PO CAPS
50.0000 mg | ORAL_CAPSULE | Freq: Every day | ORAL | 0 refills | Status: AC
Start: 2023-08-20 — End: ?

## 2023-05-24 MED ORDER — LISDEXAMFETAMINE DIMESYLATE 50 MG PO CAPS
50.0000 mg | ORAL_CAPSULE | Freq: Every day | ORAL | 0 refills | Status: DC
Start: 2023-06-25 — End: 2023-07-08

## 2023-06-03 ENCOUNTER — Ambulatory Visit: Payer: BC Managed Care – PPO | Admitting: Neurology

## 2023-06-03 ENCOUNTER — Encounter: Payer: Self-pay | Admitting: Neurology
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
## 2023-06-28 ENCOUNTER — Other Ambulatory Visit: Payer: Self-pay | Admitting: Psychiatry

## 2023-06-28 DIAGNOSIS — F902 Attention-deficit hyperactivity disorder, combined type: Secondary | ICD-10-CM

## 2023-06-29 NOTE — Telephone Encounter (Signed)
Has RF at [facility]

## 2023-07-07 NOTE — Telephone Encounter (Signed)
Patient forgot that she had requested RF to a different pharmacy and she does have RF available. She asked to leave as is. Will ask provider to refuse this RF request.

## 2023-07-08 ENCOUNTER — Telehealth: Payer: Self-pay | Admitting: Psychiatry

## 2023-07-08 ENCOUNTER — Other Ambulatory Visit: Payer: Self-pay

## 2023-07-08 DIAGNOSIS — F902 Attention-deficit hyperactivity disorder, combined type: Secondary | ICD-10-CM

## 2023-07-08 MED ORDER — LISDEXAMFETAMINE DIMESYLATE 50 MG PO CAPS
50.0000 mg | ORAL_CAPSULE | Freq: Every day | ORAL | 0 refills | Status: AC
Start: 2023-09-02 — End: ?

## 2023-07-08 MED ORDER — LISDEXAMFETAMINE DIMESYLATE 50 MG PO CAPS
50.0000 mg | ORAL_CAPSULE | Freq: Every day | ORAL | 0 refills | Status: AC
Start: 2023-08-05 — End: ?

## 2023-07-08 MED ORDER — LISDEXAMFETAMINE DIMESYLATE 50 MG PO CAPS
ORAL_CAPSULE | ORAL | 0 refills | Status: DC
Start: 2023-07-08 — End: 2023-07-08

## 2023-07-08 NOTE — Telephone Encounter (Signed)
Pt asked to have the subsequent scripts transferred to [facility] also

## 2023-07-08 NOTE — Telephone Encounter (Signed)
Pended 3 scripts to requested pharmacy.

## 2023-07-08 NOTE — Telephone Encounter (Signed)
Pt called at 9:18a.  She stated that the [facility] does not carry the Vyvanse.  Pt is requesting refill [facility] [place].  Next appt 2/10

## 2023-07-09 ENCOUNTER — Other Ambulatory Visit (HOSPITAL_COMMUNITY): Payer: Self-pay

## 2023-07-09 MED ORDER — LISDEXAMFETAMINE DIMESYLATE 50 MG PO CAPS
50.0000 mg | ORAL_CAPSULE | Freq: Every day | ORAL | 0 refills | Status: AC
Start: 2023-07-09 — End: ?
  Filled 2023-07-09: qty 30, 30d supply, fill #0

## 2023-07-10 ENCOUNTER — Other Ambulatory Visit (HOSPITAL_COMMUNITY): Payer: Self-pay

## 2023-07-17 ENCOUNTER — Encounter: Payer: Self-pay | Admitting: Internal Medicine

## 2023-07-18 ENCOUNTER — Other Ambulatory Visit: Payer: Self-pay | Admitting: Internal Medicine

## 2023-07-18 DIAGNOSIS — Z1231 Encounter for screening mammogram for malignant neoplasm of breast: Secondary | ICD-10-CM

## 2023-08-08 ENCOUNTER — Ambulatory Visit: Payer: BC Managed Care – PPO

## 2023-08-12 ENCOUNTER — Other Ambulatory Visit: Payer: Self-pay | Admitting: Psychiatry

## 2023-08-12 ENCOUNTER — Other Ambulatory Visit (HOSPITAL_COMMUNITY): Payer: Self-pay

## 2023-08-12 DIAGNOSIS — F902 Attention-deficit hyperactivity disorder, combined type: Secondary | ICD-10-CM

## 2023-08-13 ENCOUNTER — Other Ambulatory Visit (HOSPITAL_COMMUNITY): Payer: Self-pay

## 2023-08-13 ENCOUNTER — Encounter (HOSPITAL_COMMUNITY): Payer: Self-pay

## 2023-08-13 NOTE — Telephone Encounter (Signed)
Called pharmacy and patient picked up Rx today at [facility].

## 2023-08-19 ENCOUNTER — Telehealth: Payer: Self-pay

## 2023-08-19 NOTE — Telephone Encounter (Signed)
No show 10:30 PV.  Left message that I would call back in 5 min

## 2023-08-19 NOTE — Telephone Encounter (Signed)
Called back with no answer.  Left message indicating that she needed to call back and reschedule the PV by 5pm today or her colonoscopy would be cancelled.

## 2023-08-19 NOTE — Telephone Encounter (Signed)
No call back. Pre visit and colonoscopy cancelled and letter sent.

## 2023-08-21 ENCOUNTER — Other Ambulatory Visit (INDEPENDENT_AMBULATORY_CARE_PROVIDER_SITE_OTHER): Payer: Self-pay | Admitting: Adult Health

## 2023-08-21 DIAGNOSIS — R110 Nausea: Secondary | ICD-10-CM

## 2023-08-27 ENCOUNTER — Encounter: Payer: Self-pay | Admitting: Psychiatry

## 2023-09-05 ENCOUNTER — Encounter: Payer: BC Managed Care – PPO | Admitting: Internal Medicine

## 2023-09-08 ENCOUNTER — Ambulatory Visit
Admission: RE | Admit: 2023-09-08 | Discharge: 2023-09-08 | Disposition: A | Discharge status: Home / Self Care | Payer: BC Managed Care – PPO | Source: Ambulatory Visit | Attending: Internal Medicine | Admitting: Internal Medicine

## 2023-09-08 DIAGNOSIS — Z1231 Encounter for screening mammogram for malignant neoplasm of breast: Secondary | ICD-10-CM

## 2023-11-06 ENCOUNTER — Other Ambulatory Visit (HOSPITAL_COMMUNITY): Payer: Self-pay

## 2023-11-07 ENCOUNTER — Encounter: Payer: Self-pay | Admitting: Physical Therapy

## 2023-11-07 ENCOUNTER — Ambulatory Visit: Payer: 59 | Attending: Neurology | Admitting: Physical Therapy

## 2023-11-07 VITALS — BP 153/93 | HR 65

## 2023-11-07 DIAGNOSIS — M6281 Muscle weakness (generalized): Secondary | ICD-10-CM | POA: Diagnosis present

## 2023-11-07 DIAGNOSIS — R278 Other lack of coordination: Secondary | ICD-10-CM | POA: Diagnosis present

## 2023-11-07 DIAGNOSIS — R2689 Other abnormalities of gait and mobility: Secondary | ICD-10-CM | POA: Diagnosis present

## 2023-11-07 DIAGNOSIS — R2681 Unsteadiness on feet: Secondary | ICD-10-CM | POA: Insufficient documentation

## 2023-11-07 DIAGNOSIS — R293 Abnormal posture: Secondary | ICD-10-CM | POA: Diagnosis present
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
## 2023-11-10 ENCOUNTER — Ambulatory Visit: Payer: 59 | Admitting: Physical Therapy

## 2023-11-10 ENCOUNTER — Encounter: Payer: Self-pay | Admitting: Physical Therapy

## 2023-11-10 VITALS — BP 130/85 | HR 87

## 2023-11-10 DIAGNOSIS — R2689 Other abnormalities of gait and mobility: Secondary | ICD-10-CM | POA: Diagnosis not present

## 2023-11-10 DIAGNOSIS — R2681 Unsteadiness on feet: Secondary | ICD-10-CM

## 2023-11-10 DIAGNOSIS — R293 Abnormal posture: Secondary | ICD-10-CM

## 2023-11-10 DIAGNOSIS — M6281 Muscle weakness (generalized): Secondary | ICD-10-CM
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
## 2023-11-17 ENCOUNTER — Ambulatory Visit: Payer: 59 | Attending: Neurology | Admitting: Physical Therapy

## 2023-11-17 ENCOUNTER — Encounter: Payer: Self-pay | Admitting: Physical Therapy

## 2023-11-17 DIAGNOSIS — R2681 Unsteadiness on feet: Secondary | ICD-10-CM | POA: Diagnosis present

## 2023-11-17 DIAGNOSIS — M6281 Muscle weakness (generalized): Secondary | ICD-10-CM | POA: Insufficient documentation

## 2023-11-17 DIAGNOSIS — R293 Abnormal posture: Secondary | ICD-10-CM | POA: Insufficient documentation

## 2023-11-17 DIAGNOSIS — R2689 Other abnormalities of gait and mobility: Secondary | ICD-10-CM | POA: Insufficient documentation

## 2023-11-17 NOTE — Therapy (Signed)
OUTPATIENT PHYSICAL THERAPY NEURO TRATMENT   Patient Name: Gina Washington MRN: [id number] DOB:Feb 26, 1966, 58 y.o., female Today's Date: 11/17/2023   PCP: Gordan Payment., MD  REFERRING PROVIDER: Curt Bears, MD   END OF SESSION:  PT End of Session - 11/17/23 0944     Visit Number 3    Number of Visits 7    Date for PT Re-Evaluation 01/06/24    Authorization Type A[employer]    PT Start Time [phone]   pt late to session   PT Stop Time 1017    PT Time Calculation (min) 35 min    Equipment Utilized During Treatment Gait belt    Activity Tolerance Patient tolerated treatment well    Behavior During Therapy WFL for tasks assessed/performed             Past Medical History:  Diagnosis Date   ADHD    Anxiety    B12 deficiency    Back pain    Basal cell carcinoma    leg   Basal cell carcinoma    Constipation    Functional movement disorder    GERD (gastroesophageal reflux disease)    Herpes zoster without complication    Hypertension    Kidney problem    Lower extremity edema    Migraine    Neurological movement disorder    Parkinsonism (HCC)    Vaginal Pap smear, abnormal    age 84 yr- cryo done   Past Surgical History:  Procedure Laterality Date   ANKLE ARTHROSCOPY     CESAREAN SECTION     CRYOTHERAPY     Patient Active Problem List   Diagnosis Date Noted   Nausea 10/28/2022   Stiffness of right shoulder, not elsewhere classified 07/25/2022   Muscle weakness (generalized) 07/25/2022   SOB (shortness of breath) on exertion 05/28/2022   Elevated fasting glucose 05/28/2022   Human bite 02/25/2022   Gait abnormality 11/19/2021   Tremor of right hand 09/10/2021   Other hyperlipidemia 06/23/2021   HTN (hypertension), benign 03/25/2021   Polyphagia 01/15/2021   Class 1 obesity with serious comorbidity and body mass index (BMI) of 32.0 to 32.9 in adult 12/19/2020   Vitamin D deficiency 12/19/2020   B12 deficiency 07/11/2020   Rigidity 07/11/2020   Neoplasm of  uncertain behavior of skin 01/01/2020   Functional movement disorder 11/19/2018   Attention deficit hyperactivity disorder, combined type 07/19/2018   Generalized anxiety disorder 07/19/2018    ONSET DATE: 10/29/2023  REFERRING DIAG: G20.C (ICD-10-CM) - Parkinsonism (HCC) R26.89 (ICD-10-CM) - Imbalance  THERAPY DIAG:  Other abnormalities of gait and mobility  Unsteadiness on feet  Muscle weakness (generalized)  Abnormal posture  Rationale for Evaluation and Treatment: Rehabilitation  SUBJECTIVE:  SUBJECTIVE STATEMENT: No falls, nothing new. Tried the exercises and they are going well.   Pt accompanied by: self  PERTINENT HISTORY: PMH: Parkinsonism, GERD, HTN, Migraine, ADHD, Anxiety, Basal Cell Carcinoma  DaTscan 04/02/2023 consistent with parkinsonism   PAIN:  Are you having pain? No  There were no vitals filed for this visit.   PRECAUTIONS: Fall  WEIGHT BEARING RESTRICTIONS: No  FALLS: Has patient fallen in last 6 months? Yes. Number of falls 2  LIVING ENVIRONMENT: Lives with: lives with their spouse Lives in: House/apartment StairsYes: Internal: 12 steps; on right going up and External: 8 steps; on right going up and on left going up - does not have to go upstairs. Hardly ever uses the indoor steps.  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   OBJECTIVE:  Note: Objective measures were completed at Evaluation unless otherwise noted.  DIAGNOSTIC FINDINGS: DaTscan  03/2023 FINDINGS: There is marked decreased radiotracer activity within the posterior LEFT and RIGHT striatum. Decreased radiotracer activity in the head of the LEFT caudate nucleus compared to the RIGHT. Impression: Marked decreased radiotracer activity in the bilateral striata is a pattern suggestive of Parkinson's syndrome pathology. Greater loss activity on the LEFT.   COGNITION: Overall cognitive status: Within functional limits for tasks assessed   SENSATION: Light touch: WFL  COORDINATION: Heel to shin: WNL    MUSCLE TONE: pt's R foot always wants invert    POSTURE: rounded shoulders and forward head   LOWER EXTREMITY MMT:    MMT Right Eval Left Eval  Hip flexion 5 5  Hip extension    Hip abduction    Hip adduction    Hip internal rotation    Hip external rotation    Knee flexion 4+ 5  Knee extension 4+ 5  Ankle dorsiflexion 5 5  Ankle plantarflexion    Ankle inversion    Ankle eversion    (Blank rows = not tested)  BED MOBILITY:  Pt reports bed is super high - has to jump in and jump out.   TRANSFERS: Assistive device utilized: None  Sit to stand: Modified independence Stand to sit: Modified independence   GAIT: Gait pattern: step through pattern, decreased arm swing- Right, decreased stride length, Right foot flat, and decreased trunk rotation Distance walked: Clinical findings  Assistive device utilized: None Level of assistance: SBA Comments: Pt with decr arm swing with RUE, decr truncal rotation, forward head during gait   FUNCTIONAL TESTS:  5 times sit to stand: 11.4 seconds with no UE support  10 meter walk test: 10.84 seconds = 3.03 ft/sec  TREATMENT DATE:   Therapeutic Exercise: SciFit with BUE/BLE Multi-Peaks at Gear 3.0 for 8 minutes for neural priming, reciprocal movement patterns, and stepping intensity.  Pt reporting RPE as 5/10 at the end   NMR: Working on gait with arm swing/trunk rotation - with PT standing behind pt holding 2 poles and helping to facilitate arm swing with pt focusing on tall posture and incr stride length/foot clearance (esp with RLE), performed for 345' with walking poles, then performed an additional 150' with poles removed with pt trying to facilitate arm swing on her own and cued to continue taking longer steps and foot clearance with RLE. Pt did demo improve arm swing with RUE, but more difficulty without assist from therapist with walking pole. Pt with improved arm swing when focusing on foot clearance with RLE  On air ex with wider BOS: trunk rotation across body and reaching and grabbing Squigz then taking same hand and reaching superior/laterally to place on mirror, performed 10 reps on each side. Cues for opening hands up big when grabbing Squigz On blue foam beam: Alternating lateral stepping over smaller orange obstacle, performed 12 reps each side, with adding in arm reach and looking at hand for balance challenge, pt more challenged with foot clearance with RLE  Alternating forward stepping with PWR Up posture 10 reps each side, cues for opening up hands, pt needing intermittent UE support on bar, pt taking a few steps to bring foot back onto balance beam  PATIENT EDUCATION: Education details: Continue HEP, importance of larger amplitude movements in regards to PD in everyday life tasks and walking, gave pt e-mail of PD social worker that is in charge of the younger PD group that are still working (due to pt's work schedule, unable to go to any of the community exercise classes) Person educated: Patient Education method: Programmer, multimedia, Facilities manager, Verbal cues, and Handouts Education comprehension: verbalized understanding and returned demonstration  HOME EXERCISE PROGRAM: Standing PWR Moves  Access Code: [email or web address] URL: [email or web address] Date:  11/10/2023 Prepared by: Sherlie Ban  Exercises - Romberg Stance Eyes Closed on Foam Pad  - 1 x daily - 5 x weekly - 3 sets - 30 hold - Standing March with Alternating Arm Lift  - 1 x daily - 5 x weekly - 2 sets - 10 reps - Correct Standing Posture  - 1-2 x daily - 5 x weekly - 2 sets - 10 reps  GOALS: Goals reviewed with patient? Yes  SHORT TERM GOALS: Target date: 11/28/2023  Pt will be independent with initial PD specific HEP for strength, gait, balance, in order to build upon functional gains made in therapy. Baseline: Goal status: INITIAL  2.  Pt will verbalize understanding of local Parkinson's disease resources, including options for continue community fitness.  Baseline:  Goal status: INITIAL  LONG TERM GOALS: Target date: 12/19/2023  Pt will be independent with final PD specific HEP for strength, gait, balance, in order to build upon functional gains made in therapy. Baseline:  Goal status: INITIAL  2.  Pt will verbalize understanding of transition to appropriate community fitness classes upon d/c from PT to maximize gains made in therapy.  Baseline:  Goal status: INITIAL  3.  Pt will improve miniBEST to at least a 24/28 in order to demo decr fall risk.  Baseline: 22/28 Goal status: INITIAL  4.  Pt will improve gait speed with no AD to at least 3.4 ft/sec in order to demo improved community mobility.   Baseline: 10.84 seconds =  3.03 ft/sec Goal status: INITIAL   ASSESSMENT:  CLINICAL IMPRESSION: Pt late to session, so full session was limited today. Utilized walking poles during gait with PT helping to facilitate reciprocal arm swing esp with RUE>LUE. Pt also needing cues for RLE heel strike and stride length as pt with decr heel strike. Pt did do better with this when focusing on taking a bigger step. Remainder of session focused on balance tasks and stepping, with pt with more difficulty with RLE foot clearance tasks over an obstacle. Will continue per POC.      OBJECTIVE IMPAIRMENTS: Abnormal gait, decreased activity tolerance, decreased balance, decreased coordination, decreased strength, impaired flexibility, impaired tone, and postural dysfunction.   ACTIVITY LIMITATIONS: locomotion level  PARTICIPATION LIMITATIONS:  N/A, pt works full time   PERSONAL FACTORS: Age, Behavior pattern, Past/current experiences, Time since onset of injury/illness/exacerbation, and 3+ comorbidities: Parkinsonism, GERD, HTN, Migraine, ADHD, Anxiety, Basal Cell Carcinoma  are also affecting patient's functional outcome.   REHAB POTENTIAL: Good  CLINICAL DECISION MAKING: Stable/uncomplicated  EVALUATION COMPLEXITY: Low  PLAN:  PT FREQUENCY: 1x/week  PT DURATION: 8 weeks - anticipate just 6 weeks   PLANNED INTERVENTIONS: 97164- PT Re-evaluation, 97110-Therapeutic exercises, 97530- Therapeutic activity, 97112- Neuromuscular re-education, 97535- Self Care, 16109- Manual therapy, Patient/Family education, Balance training, and Vestibular training  PLAN FOR NEXT SESSION: work on posture, SLS, obstacles, arm swing with gait. EC balance, balance on unlevel surfaces. Did pt get referral for speech therapy?   CHECK BP   Drake Leach, PT, DPT 11/17/2023, 10:54 AM

## 2023-11-24 ENCOUNTER — Ambulatory Visit: Payer: BC Managed Care – PPO | Admitting: Psychiatry

## 2023-11-24 ENCOUNTER — Encounter: Payer: Self-pay | Admitting: Physical Therapy

## 2023-11-24 ENCOUNTER — Ambulatory Visit: Payer: 59 | Admitting: Physical Therapy

## 2023-11-24 VITALS — BP 131/77 | HR 64

## 2023-11-24 DIAGNOSIS — R2689 Other abnormalities of gait and mobility: Secondary | ICD-10-CM

## 2023-11-24 DIAGNOSIS — R293 Abnormal posture: Secondary | ICD-10-CM

## 2023-11-24 DIAGNOSIS — M6281 Muscle weakness (generalized): Secondary | ICD-10-CM

## 2023-11-24 DIAGNOSIS — R2681 Unsteadiness on feet: Secondary | ICD-10-CM

## 2023-11-24 NOTE — Therapy (Signed)
 OUTPATIENT PHYSICAL THERAPY NEURO TRATMENT   Patient Name: Gina Washington MRN: [id number] DOB:[birth year], 58 y.o., female Today's Date: 11/24/2023   PCP: Abbe Hoard., MD  REFERRING PROVIDER: Tonuzi, Lirim, MD   END OF SESSION:  PT End of Session - 11/24/23 1404     Visit Number 4    Number of Visits 7    Date for PT Re-Evaluation 01/06/24    Authorization Type Aetna    PT Start Time 1401    PT Stop Time 1443    PT Time Calculation (min) 42 min    Equipment Utilized During Treatment Gait belt    Activity Tolerance Patient tolerated treatment well    Behavior During Therapy WFL for tasks assessed/performed             Past Medical History:  Diagnosis Date   ADHD    Anxiety    B12 deficiency    Back pain    Basal cell carcinoma    leg   Basal cell carcinoma    Constipation    Functional movement disorder    GERD (gastroesophageal reflux disease)    Herpes zoster without complication    Hypertension    Kidney problem    Lower extremity edema    Migraine    Neurological movement disorder    Parkinsonism (HCC)    Vaginal Pap smear, abnormal    age 72 yr- cryo done   Past Surgical History:  Procedure Laterality Date   ANKLE ARTHROSCOPY     CESAREAN SECTION     CRYOTHERAPY     Patient Active Problem List   Diagnosis Date Noted   Nausea 10/28/2022   Stiffness of right shoulder, not elsewhere classified 07/25/2022   Muscle weakness (generalized) 07/25/2022   SOB (shortness of breath) on exertion 05/28/2022   Elevated fasting glucose 05/28/2022   Human bite 02/25/2022   Gait abnormality 11/19/2021   Tremor of right hand 09/10/2021   Other hyperlipidemia 06/23/2021   HTN (hypertension), benign 03/25/2021   Polyphagia 01/15/2021   Class 1 obesity with serious comorbidity and body mass index (BMI) of 32.0 to 32.9 in adult 12/19/2020   Vitamin D  deficiency 12/19/2020   B12 deficiency 07/11/2020   Rigidity 07/11/2020   Neoplasm of uncertain behavior of  skin 01/01/2020   Functional movement disorder 11/19/2018   Attention deficit hyperactivity disorder, combined type 07/19/2018   Generalized anxiety disorder 07/19/2018    ONSET DATE: 10/29/2023  REFERRING DIAG: G20.C (ICD-10-CM) - Parkinsonism (HCC) R26.89 (ICD-10-CM) - Imbalance  THERAPY DIAG:  Other abnormalities of gait and mobility  Unsteadiness on feet  Muscle weakness (generalized)  Abnormal posture  Rationale for Evaluation and Treatment: Rehabilitation  SUBJECTIVE:  SUBJECTIVE STATEMENT: Needs to change her Monday morning appts to the afternoon. Unsure if she reached out to [employer] regarding the YOPD.   Pt accompanied by: self  PERTINENT HISTORY: PMH: Parkinsonism, GERD, HTN, Migraine, ADHD, Anxiety, Basal Cell Carcinoma  DaTscan 04/02/2023 consistent with parkinsonism   PAIN:  Are you having pain? No  Vitals:   11/24/23 1406  BP: 131/77  Pulse: 64     PRECAUTIONS: Fall  WEIGHT BEARING RESTRICTIONS: No  FALLS: Has patient fallen in last 6 months? Yes. Number of falls 2  LIVING ENVIRONMENT: Lives with: lives with their spouse Lives in: House/apartment StairsYes: Internal: 12 steps; on right going up and External: 8 steps; on right going up and on left going up - does not have to go upstairs. Hardly ever uses the indoor steps.  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   OBJECTIVE:  Note: Objective measures were completed at Evaluation unless otherwise  noted.  DIAGNOSTIC FINDINGS: DaTscan 03/2023 FINDINGS: There is marked decreased radiotracer activity within the posterior LEFT and RIGHT striatum. Decreased radiotracer activity in the head of the LEFT caudate nucleus compared to the RIGHT. Impression: Marked decreased radiotracer activity in the bilateral striata is a pattern suggestive of Parkinson's syndrome pathology. Greater loss activity on the LEFT.   COGNITION: Overall cognitive status: Within functional limits for tasks assessed   SENSATION: Light touch: WFL  COORDINATION: Heel to shin: WNL    MUSCLE TONE: pt's R foot always wants invert    POSTURE: rounded shoulders and forward head   LOWER EXTREMITY MMT:    MMT Right Eval Left Eval  Hip flexion 5 5  Hip extension    Hip abduction    Hip adduction    Hip internal rotation    Hip external rotation    Knee flexion 4+ 5  Knee extension 4+ 5  Ankle dorsiflexion 5 5  Ankle plantarflexion    Ankle inversion    Ankle eversion    (Blank rows = not tested)  BED MOBILITY:  Pt reports bed is super high - has to jump in and jump out.   TRANSFERS: Assistive device utilized: None  Sit to stand: Modified independence Stand to sit: Modified independence   GAIT: Gait pattern: step through pattern, decreased arm swing- Right, decreased stride length, Right foot flat, and decreased trunk rotation Distance walked: Clinical findings  Assistive device utilized: None Level of assistance: SBA Comments: Pt with decr arm swing with RUE, decr truncal rotation, forward head during gait   FUNCTIONAL TESTS:  5 times sit to stand: 11.4 seconds with no UE support  10 meter walk test: 10.84 seconds = 3.03 ft/sec  TREATMENT DATE:     NMR: On rockerboard in A/P direction Alternating stepping with contralateral march x5 reps each side,  progressed difficulty to standing on air ex on top of board x8 reps each side with few second hold in SLS and working on tall posture, then progressed to holding 3# ball and performing chest presses when stepping up for coordination, performed x8 reps each side, pt more challenged by this  10 reps PWR Up, pt with 2 instances of needing to use the bars for balance due to losing balance posteriorly  EC 3 x 20-30 seconds  On blue mat: Ipsilateral stepping with ipsilateral scarf toss 12 reps, then contralateral stepping with reciprocal scarf toss 12 reps, then retro stepping with reciprocal scarf toss 12 reps, focus on big movement patterns and opening up hands big when stepping  On blue foam beam: Sit to stands with 4# medicine ball with tossing to floor and catching it 10 reps, cues to lift ball up overhead for posture before throwing it to the floor  Side stepping and tossing 6# medicine ball to floor and catching, approx 10 reps each side, sometimes pt not throwing it directly down in front of her and unable to catch Gait with 115' of reciprocal scarf toss with focus on big movement patterns and posture  PATIENT EDUCATION: Education details: Continue HEP and working on larger amplitude movements in daily life, updated pt's appts as pt needing afternoon times on Mondays and provided new handout  Person educated: Patient Education method: Explanation, Demonstration, and Verbal cues Education comprehension: verbalized understanding and returned demonstration  HOME EXERCISE PROGRAM: Standing PWR Moves  Access Code: [email or web address] URL: [email or web address] Date: 11/10/2023 Prepared by: Jonathan Neighbor  Exercises - Romberg Stance Eyes Closed on Foam Pad  - 1 x daily - 5 x weekly - 3 sets - 30 hold - Standing March with Alternating Arm Lift  - 1 x daily - 5 x weekly - 2 sets - 10 reps - Correct Standing Posture  - 1-2 x daily - 5 x weekly - 2 sets - 10 reps  GOALS: Goals reviewed with  patient? Yes  SHORT TERM GOALS: Target date: 11/28/2023  Pt will be independent with initial PD specific HEP for strength, gait, balance, in order to build upon functional gains made in therapy. Baseline: Goal status: INITIAL  2.  Pt will verbalize understanding of local Parkinson's disease resources, including options for continue community fitness.  Baseline:  Goal status: INITIAL  LONG TERM GOALS: Target date: 12/19/2023  Pt will be independent with final PD specific HEP for strength, gait, balance, in order to build upon functional gains made in therapy. Baseline:  Goal status: INITIAL  2.  Pt will verbalize understanding of transition to appropriate community fitness classes upon d/c from PT to maximize gains made in therapy.  Baseline:  Goal status: INITIAL  3.  Pt will improve miniBEST to at least a 24/28 in order to demo decr fall risk.  Baseline: 22/28 Goal status: INITIAL  4.  Pt will improve gait speed with no AD to at least 3.4 ft/sec in order to demo improved community mobility.   Baseline: 10.84 seconds = 3.03 ft/sec Goal status: INITIAL   ASSESSMENT:  CLINICAL IMPRESSION: Today's skilled session focused on balance strategies on compliant surfaces, SLS stability, reciprocal and larger amplitude movements. Pt initially challenged by reciprocal UE movements with stepping tasks for balance, but did improve with incr practice. Pt demonstrating improvements in larger amplitude movements with gait,  pt reports she has been working on this at home. Will continue per POC.     OBJECTIVE IMPAIRMENTS: Abnormal gait, decreased activity tolerance, decreased balance, decreased coordination, decreased strength, impaired flexibility, impaired tone, and postural dysfunction.   ACTIVITY LIMITATIONS: locomotion level  PARTICIPATION LIMITATIONS:  N/A, pt works full time   PERSONAL FACTORS: Age, Behavior pattern, Past/current experiences, Time since onset of  injury/illness/exacerbation, and 3+ comorbidities: Parkinsonism, GERD, HTN, Migraine, ADHD, Anxiety, Basal Cell Carcinoma  are also affecting patient's functional outcome.   REHAB POTENTIAL: Good  CLINICAL DECISION MAKING: Stable/uncomplicated  EVALUATION COMPLEXITY: Low  PLAN:  PT FREQUENCY: 1x/week  PT DURATION: 8 weeks - anticipate just 6 weeks   PLANNED INTERVENTIONS: 97164- PT Re-evaluation, 97110-Therapeutic exercises, 97530- Therapeutic activity, 97112- Neuromuscular re-education, 97535- Self Care, 45409- Manual therapy, Patient/Family education, Balance training, and Vestibular training  PLAN FOR NEXT SESSION: work on posture, SLS, obstacles, arm swing with gait. EC balance, balance on unlevel surfaces. Did pt get referral for speech therapy?   CHECK BP   [place]ron [place], PT, DPT 11/24/2023, 4:39 PM

## 2023-11-26 ENCOUNTER — Other Ambulatory Visit (HOSPITAL_COMMUNITY): Payer: Self-pay

## 2023-11-26 MED ORDER — LISDEXAMFETAMINE DIMESYLATE 50 MG PO CAPS
50.0000 mg | ORAL_CAPSULE | Freq: Every day | ORAL | 0 refills | Status: AC
  Filled 2023-12-22: qty 30, 30d supply, fill #0

## 2023-11-26 MED ORDER — LISDEXAMFETAMINE DIMESYLATE 50 MG PO CAPS
50.0000 mg | ORAL_CAPSULE | Freq: Every day | ORAL | 0 refills | Status: DC
  Filled 2024-02-16 – 2024-02-17 (×2): qty 30, 30d supply, fill #0

## 2023-11-26 MED ORDER — LISDEXAMFETAMINE DIMESYLATE 50 MG PO CAPS
50.0000 mg | ORAL_CAPSULE | Freq: Every day | ORAL | 0 refills | Status: AC
  Filled 2024-01-19: qty 30, 30d supply, fill #0

## 2023-11-27 ENCOUNTER — Other Ambulatory Visit (HOSPITAL_COMMUNITY): Payer: Self-pay

## 2023-12-01 ENCOUNTER — Ambulatory Visit: Payer: 59 | Admitting: Physical Therapy

## 2023-12-05 ENCOUNTER — Ambulatory Visit: Payer: 59 | Admitting: Physical Therapy

## 2023-12-08 ENCOUNTER — Ambulatory Visit: Payer: 59 | Admitting: Physical Therapy

## 2023-12-15 ENCOUNTER — Ambulatory Visit: Payer: 59 | Admitting: Physical Therapy

## 2023-12-15 ENCOUNTER — Encounter: Payer: Self-pay | Admitting: Physical Therapy

## 2023-12-15 ENCOUNTER — Ambulatory Visit: Payer: Self-pay | Attending: Neurology | Admitting: Physical Therapy

## 2023-12-15 DIAGNOSIS — M6281 Muscle weakness (generalized): Secondary | ICD-10-CM | POA: Diagnosis present

## 2023-12-15 DIAGNOSIS — R293 Abnormal posture: Secondary | ICD-10-CM | POA: Diagnosis present

## 2023-12-15 DIAGNOSIS — R2689 Other abnormalities of gait and mobility: Secondary | ICD-10-CM | POA: Diagnosis present

## 2023-12-15 DIAGNOSIS — R2681 Unsteadiness on feet: Secondary | ICD-10-CM | POA: Insufficient documentation
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
## 2023-12-22 ENCOUNTER — Other Ambulatory Visit (HOSPITAL_COMMUNITY): Payer: Self-pay

## 2023-12-22 ENCOUNTER — Encounter: Payer: Self-pay | Admitting: Physical Therapy

## 2023-12-22 ENCOUNTER — Ambulatory Visit: Payer: Self-pay | Admitting: Physical Therapy

## 2023-12-22 DIAGNOSIS — R2689 Other abnormalities of gait and mobility: Secondary | ICD-10-CM

## 2023-12-22 DIAGNOSIS — R2681 Unsteadiness on feet: Secondary | ICD-10-CM

## 2023-12-22 DIAGNOSIS — M6281 Muscle weakness (generalized): Secondary | ICD-10-CM
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
## 2024-01-19 ENCOUNTER — Other Ambulatory Visit: Payer: Self-pay

## 2024-01-19 ENCOUNTER — Other Ambulatory Visit (HOSPITAL_COMMUNITY): Payer: Self-pay

## 2024-01-20 ENCOUNTER — Other Ambulatory Visit: Payer: Self-pay

## 2024-01-20 ENCOUNTER — Other Ambulatory Visit (HOSPITAL_COMMUNITY): Payer: Self-pay

## 2024-02-16 ENCOUNTER — Other Ambulatory Visit (HOSPITAL_COMMUNITY): Payer: Self-pay

## 2024-02-17 ENCOUNTER — Other Ambulatory Visit: Payer: Self-pay

## 2024-03-22 ENCOUNTER — Other Ambulatory Visit (HOSPITAL_COMMUNITY): Payer: Self-pay

## 2024-03-22 MED ORDER — LISDEXAMFETAMINE DIMESYLATE 50 MG PO CAPS
50.0000 mg | ORAL_CAPSULE | Freq: Every day | ORAL | 0 refills | Status: DC
  Filled 2024-03-23: qty 30, 30d supply, fill #0

## 2024-03-23 ENCOUNTER — Other Ambulatory Visit (HOSPITAL_COMMUNITY): Payer: Self-pay

## 2024-03-25 ENCOUNTER — Other Ambulatory Visit (HOSPITAL_COMMUNITY): Payer: Self-pay

## 2024-03-26 ENCOUNTER — Other Ambulatory Visit (HOSPITAL_COMMUNITY): Payer: Self-pay

## 2024-05-03 ENCOUNTER — Other Ambulatory Visit (HOSPITAL_COMMUNITY): Payer: Self-pay

## 2024-05-03 MED ORDER — LISDEXAMFETAMINE DIMESYLATE 50 MG PO CAPS
50.0000 mg | ORAL_CAPSULE | Freq: Every day | ORAL | 0 refills | Status: AC
  Filled 2024-05-03: qty 30, 30d supply, fill #0

## 2024-05-31 ENCOUNTER — Other Ambulatory Visit (HOSPITAL_COMMUNITY): Payer: Self-pay

## 2024-05-31 ENCOUNTER — Encounter (HOSPITAL_COMMUNITY): Payer: Self-pay

## 2024-05-31 MED ORDER — LISDEXAMFETAMINE DIMESYLATE 60 MG PO CAPS
60.0000 mg | ORAL_CAPSULE | Freq: Every morning | ORAL | 0 refills | Status: AC
  Filled 2024-08-10: qty 30, 30d supply, fill #0

## 2024-05-31 MED ORDER — LISDEXAMFETAMINE DIMESYLATE 60 MG PO CAPS
60.0000 mg | ORAL_CAPSULE | ORAL | 0 refills | Status: AC
  Filled 2024-05-31 – 2024-06-02 (×2): qty 30, 30d supply, fill #0

## 2024-05-31 MED ORDER — LISDEXAMFETAMINE DIMESYLATE 60 MG PO CAPS
60.0000 mg | ORAL_CAPSULE | Freq: Every morning | ORAL | 0 refills | Status: AC
  Filled 2024-07-07: qty 30, 30d supply, fill #0

## 2024-06-02 ENCOUNTER — Other Ambulatory Visit (HOSPITAL_COMMUNITY): Payer: Self-pay

## 2024-06-21 NOTE — Therapy (Incomplete)
 [facility] [address] [place], [place], [address] Phone: [phone]   Fax:  [phone]  Patient Details  Name: Gina Washington MRN: [id number] Date of Birth: 04/20/1966 Referring Provider:  Thurmond Cathlyn LABOR., MD  Encounter Date: 06/24/2024  Occupational Therapy Parkinson's Disease Screen  Hand dominance:  ***   Physical Performance Test item #2 (simulated eating):  *** sec  Physical Performance Test item #4 (donning/doffing jacket):  *** sec  Fastening/unfastening 3 buttons in:  ***sec  9-hole peg test:    RUE  *** sec        LUE  *** sec  Box & Blocks Test:   RUE  *** blocks        LUE  *** blocks  Standing Functional Reach Test:   RUE  *** inches        LUE  *** inches  Change in ability to perform ADLs/IADLs:  ***  Other Comments:  ***  Pt would benefit from occupational therapy evaluation due to  ***  Pt does not require occupational therapy services at this time.  Recommended occupational therapy screen in   ***    Burnard JINNY Roads, OT 06/21/2024, 10:32 AM  [facility] [address] [place], [place], [address] Phone: [phone]   Fax:  [phone]

## 2024-06-24 ENCOUNTER — Ambulatory Visit: Attending: Physical Therapy | Admitting: Physical Therapy

## 2024-06-24 ENCOUNTER — Ambulatory Visit: Admitting: Occupational Therapy

## 2024-06-24 ENCOUNTER — Ambulatory Visit: Admitting: Speech Pathology

## 2024-07-07 ENCOUNTER — Other Ambulatory Visit (HOSPITAL_COMMUNITY): Payer: Self-pay

## 2024-08-10 ENCOUNTER — Other Ambulatory Visit (HOSPITAL_COMMUNITY): Payer: Self-pay
